# Patient Record
Sex: Female | Born: 1950 | Race: White | Hispanic: No | Marital: Married | Smoking: Never smoker
Health system: Southern US, Community
[De-identification: ages and names within clinical notes are randomized; demographics above are authoritative.]

## PROBLEM LIST (undated history)

## (undated) DIAGNOSIS — F32A Depression, unspecified: Secondary | ICD-10-CM

## (undated) DIAGNOSIS — C50919 Malignant neoplasm of unspecified site of unspecified female breast: Secondary | ICD-10-CM

## (undated) DIAGNOSIS — F329 Major depressive disorder, single episode, unspecified: Secondary | ICD-10-CM

## (undated) DIAGNOSIS — E079 Disorder of thyroid, unspecified: Secondary | ICD-10-CM

## (undated) DIAGNOSIS — F419 Anxiety disorder, unspecified: Secondary | ICD-10-CM

## (undated) HISTORY — PX: OTHER SURGICAL HISTORY: SHX169

## (undated) HISTORY — PX: COSMETIC SURGERY: SHX468

## (undated) HISTORY — PX: NASAL SEPTUM SURGERY: SHX37

## (undated) HISTORY — PX: TONSILLECTOMY: SUR1361

## (undated) HISTORY — PX: NECK SURGERY: SHX720

## (undated) HISTORY — PX: BREAST LUMPECTOMY: SHX2

## (undated) HISTORY — PX: FACIAL COSMETIC SURGERY: SHX629

## (undated) HISTORY — DX: Disorder of thyroid, unspecified: E07.9

---

## 1984-06-16 HISTORY — PX: DILATION AND CURETTAGE OF UTERUS: SHX78

## 1996-06-16 DIAGNOSIS — C50919 Malignant neoplasm of unspecified site of unspecified female breast: Secondary | ICD-10-CM

## 1996-06-16 HISTORY — DX: Malignant neoplasm of unspecified site of unspecified female breast: C50.919

## 1998-01-04 ENCOUNTER — Other Ambulatory Visit: Admission: RE | Admit: 1998-01-04 | Discharge: 1998-01-04 | Payer: Self-pay | Admitting: Obstetrics and Gynecology

## 1998-01-05 ENCOUNTER — Other Ambulatory Visit: Admission: RE | Admit: 1998-01-05 | Discharge: 1998-01-05 | Payer: Self-pay | Admitting: Obstetrics and Gynecology

## 1998-07-05 ENCOUNTER — Encounter: Payer: Self-pay | Admitting: Internal Medicine

## 1999-02-13 ENCOUNTER — Other Ambulatory Visit: Admission: RE | Admit: 1999-02-13 | Discharge: 1999-02-13 | Payer: Self-pay | Admitting: Obstetrics and Gynecology

## 1999-11-12 ENCOUNTER — Other Ambulatory Visit: Admission: RE | Admit: 1999-11-12 | Discharge: 1999-11-12 | Payer: Self-pay | Admitting: Obstetrics and Gynecology

## 1999-11-27 ENCOUNTER — Other Ambulatory Visit: Admission: RE | Admit: 1999-11-27 | Discharge: 1999-11-27 | Payer: Self-pay | Admitting: Obstetrics and Gynecology

## 1999-11-27 ENCOUNTER — Encounter (INDEPENDENT_AMBULATORY_CARE_PROVIDER_SITE_OTHER): Payer: Self-pay

## 2000-04-09 ENCOUNTER — Emergency Department (HOSPITAL_COMMUNITY): Admission: EM | Admit: 2000-04-09 | Discharge: 2000-04-09 | Payer: Self-pay

## 2000-11-17 ENCOUNTER — Encounter: Payer: Self-pay | Admitting: Internal Medicine

## 2000-11-17 ENCOUNTER — Encounter: Admission: RE | Admit: 2000-11-17 | Discharge: 2000-11-17 | Payer: Self-pay | Admitting: Internal Medicine

## 2000-12-08 ENCOUNTER — Other Ambulatory Visit: Admission: RE | Admit: 2000-12-08 | Discharge: 2000-12-08 | Payer: Self-pay | Admitting: Obstetrics and Gynecology

## 2001-03-19 ENCOUNTER — Emergency Department (HOSPITAL_COMMUNITY): Admission: EM | Admit: 2001-03-19 | Discharge: 2001-03-19 | Payer: Self-pay | Admitting: Emergency Medicine

## 2002-06-24 ENCOUNTER — Other Ambulatory Visit: Admission: RE | Admit: 2002-06-24 | Discharge: 2002-06-24 | Payer: Self-pay | Admitting: Obstetrics and Gynecology

## 2002-09-26 ENCOUNTER — Emergency Department (HOSPITAL_COMMUNITY): Admission: EM | Admit: 2002-09-26 | Discharge: 2002-09-26 | Payer: Self-pay | Admitting: Emergency Medicine

## 2002-12-29 ENCOUNTER — Encounter: Payer: Self-pay | Admitting: Emergency Medicine

## 2002-12-29 ENCOUNTER — Emergency Department (HOSPITAL_COMMUNITY): Admission: EM | Admit: 2002-12-29 | Discharge: 2002-12-29 | Payer: Self-pay | Admitting: Emergency Medicine

## 2003-08-16 ENCOUNTER — Other Ambulatory Visit: Admission: RE | Admit: 2003-08-16 | Discharge: 2003-08-16 | Payer: Self-pay | Admitting: Obstetrics and Gynecology

## 2003-08-17 ENCOUNTER — Other Ambulatory Visit: Admission: RE | Admit: 2003-08-17 | Discharge: 2003-08-17 | Payer: Self-pay | Admitting: Obstetrics and Gynecology

## 2004-02-27 ENCOUNTER — Other Ambulatory Visit: Admission: RE | Admit: 2004-02-27 | Discharge: 2004-02-27 | Payer: Self-pay | Admitting: Obstetrics and Gynecology

## 2004-05-13 ENCOUNTER — Emergency Department (HOSPITAL_COMMUNITY): Admission: EM | Admit: 2004-05-13 | Discharge: 2004-05-13 | Payer: Self-pay | Admitting: Emergency Medicine

## 2004-05-23 ENCOUNTER — Emergency Department (HOSPITAL_COMMUNITY): Admission: EM | Admit: 2004-05-23 | Discharge: 2004-05-23 | Payer: Self-pay | Admitting: Emergency Medicine

## 2004-07-09 ENCOUNTER — Ambulatory Visit: Payer: Self-pay | Admitting: Family Medicine

## 2005-06-25 ENCOUNTER — Other Ambulatory Visit: Admission: RE | Admit: 2005-06-25 | Discharge: 2005-06-25 | Payer: Self-pay | Admitting: Obstetrics and Gynecology

## 2006-10-27 ENCOUNTER — Ambulatory Visit: Payer: Self-pay | Admitting: Internal Medicine

## 2006-10-27 LAB — CONVERTED CEMR LAB
ALT: 14 units/L (ref 0–40)
BUN: 10 mg/dL (ref 6–23)
Bilirubin Urine: NEGATIVE
Bilirubin, Direct: 0.1 mg/dL (ref 0.0–0.3)
Calcium: 9.3 mg/dL (ref 8.4–10.5)
Cholesterol: 183 mg/dL (ref 0–200)
Eosinophils Absolute: 0.2 10*3/uL (ref 0.0–0.6)
GFR calc Af Amer: 96 mL/min
GFR calc non Af Amer: 80 mL/min
Glucose, Bld: 84 mg/dL (ref 70–99)
Lymphocytes Relative: 22.1 % (ref 12.0–46.0)
MCV: 90.2 fL (ref 78.0–100.0)
Monocytes Relative: 10 % (ref 3.0–11.0)
Neutro Abs: 2.4 10*3/uL (ref 1.4–7.7)
Platelets: 187 10*3/uL (ref 150–400)
Total CHOL/HDL Ratio: 3.6
Triglycerides: 68 mg/dL (ref 0–149)
Urine Glucose: NEGATIVE mg/dL

## 2006-11-03 ENCOUNTER — Ambulatory Visit: Payer: Self-pay | Admitting: Internal Medicine

## 2007-05-07 ENCOUNTER — Ambulatory Visit: Payer: Self-pay | Admitting: Internal Medicine

## 2007-05-07 DIAGNOSIS — S336XXA Sprain of sacroiliac joint, initial encounter: Secondary | ICD-10-CM | POA: Insufficient documentation

## 2007-05-18 ENCOUNTER — Telehealth: Payer: Self-pay | Admitting: Internal Medicine

## 2007-06-14 ENCOUNTER — Ambulatory Visit: Payer: Self-pay | Admitting: Gastroenterology

## 2007-10-06 ENCOUNTER — Encounter: Payer: Self-pay | Admitting: *Deleted

## 2007-10-06 DIAGNOSIS — Z87898 Personal history of other specified conditions: Secondary | ICD-10-CM | POA: Insufficient documentation

## 2007-10-06 DIAGNOSIS — I868 Varicose veins of other specified sites: Secondary | ICD-10-CM | POA: Insufficient documentation

## 2007-10-06 DIAGNOSIS — G43909 Migraine, unspecified, not intractable, without status migrainosus: Secondary | ICD-10-CM | POA: Insufficient documentation

## 2007-10-06 DIAGNOSIS — Z853 Personal history of malignant neoplasm of breast: Secondary | ICD-10-CM | POA: Insufficient documentation

## 2007-10-06 DIAGNOSIS — Z9189 Other specified personal risk factors, not elsewhere classified: Secondary | ICD-10-CM | POA: Insufficient documentation

## 2007-10-06 DIAGNOSIS — Z9089 Acquired absence of other organs: Secondary | ICD-10-CM | POA: Insufficient documentation

## 2007-10-06 DIAGNOSIS — Z87448 Personal history of other diseases of urinary system: Secondary | ICD-10-CM | POA: Insufficient documentation

## 2007-10-25 ENCOUNTER — Ambulatory Visit: Payer: Self-pay | Admitting: Internal Medicine

## 2007-10-25 DIAGNOSIS — F4321 Adjustment disorder with depressed mood: Secondary | ICD-10-CM | POA: Insufficient documentation

## 2007-10-27 ENCOUNTER — Telehealth: Payer: Self-pay | Admitting: Internal Medicine

## 2007-11-18 ENCOUNTER — Ambulatory Visit: Payer: Self-pay | Admitting: Internal Medicine

## 2008-05-10 ENCOUNTER — Emergency Department (HOSPITAL_BASED_OUTPATIENT_CLINIC_OR_DEPARTMENT_OTHER): Admission: EM | Admit: 2008-05-10 | Discharge: 2008-05-10 | Payer: Self-pay | Admitting: Emergency Medicine

## 2009-06-20 ENCOUNTER — Telehealth: Payer: Self-pay | Admitting: Internal Medicine

## 2009-06-20 ENCOUNTER — Ambulatory Visit: Payer: Self-pay | Admitting: Internal Medicine

## 2009-06-21 ENCOUNTER — Encounter: Payer: Self-pay | Admitting: Internal Medicine

## 2009-06-21 ENCOUNTER — Ambulatory Visit: Payer: Self-pay | Admitting: Internal Medicine

## 2009-07-16 ENCOUNTER — Encounter: Payer: Self-pay | Admitting: Internal Medicine

## 2009-10-08 ENCOUNTER — Ambulatory Visit: Payer: Self-pay | Admitting: Internal Medicine

## 2009-10-09 DIAGNOSIS — M810 Age-related osteoporosis without current pathological fracture: Secondary | ICD-10-CM | POA: Insufficient documentation

## 2010-01-24 ENCOUNTER — Telehealth: Payer: Self-pay | Admitting: Internal Medicine

## 2010-02-04 ENCOUNTER — Encounter: Admission: RE | Admit: 2010-02-04 | Discharge: 2010-02-04 | Payer: Self-pay | Admitting: Internal Medicine

## 2010-02-20 ENCOUNTER — Encounter: Payer: Self-pay | Admitting: Internal Medicine

## 2010-02-20 ENCOUNTER — Ambulatory Visit: Payer: Self-pay | Admitting: Internal Medicine

## 2010-02-20 LAB — CONVERTED CEMR LAB
BUN: 14 mg/dL (ref 6–23)
Basophils Relative: 1 % (ref 0.0–3.0)
CO2: 28 meq/L (ref 19–32)
Chloride: 105 meq/L (ref 96–112)
Creatinine, Ser: 0.9 mg/dL (ref 0.4–1.2)
Eosinophils Absolute: 0.2 10*3/uL (ref 0.0–0.7)
Eosinophils Relative: 5.3 % — ABNORMAL HIGH (ref 0.0–5.0)
Glucose, Bld: 85 mg/dL (ref 70–99)
HCV Ab: NEGATIVE
Hemoglobin: 15.6 g/dL — ABNORMAL HIGH (ref 12.0–15.0)
Lymphocytes Relative: 20.2 % (ref 12.0–46.0)
MCHC: 34.2 g/dL (ref 30.0–36.0)
Monocytes Relative: 8 % (ref 3.0–12.0)
Neutro Abs: 2.5 10*3/uL (ref 1.4–7.7)
Nitrite: NEGATIVE
RBC: 5.03 M/uL (ref 3.87–5.11)
Specific Gravity, Urine: 1.025 (ref 1.000–1.030)
Total Protein, Urine: NEGATIVE mg/dL
Urine Glucose: NEGATIVE mg/dL
pH: 5.5 (ref 5.0–8.0)

## 2010-02-24 ENCOUNTER — Encounter: Payer: Self-pay | Admitting: Internal Medicine

## 2010-06-18 ENCOUNTER — Encounter (INDEPENDENT_AMBULATORY_CARE_PROVIDER_SITE_OTHER): Payer: Self-pay | Admitting: *Deleted

## 2010-06-25 ENCOUNTER — Ambulatory Visit
Admission: RE | Admit: 2010-06-25 | Discharge: 2010-06-25 | Payer: Self-pay | Source: Home / Self Care | Attending: Internal Medicine | Admitting: Internal Medicine

## 2010-07-05 ENCOUNTER — Encounter (INDEPENDENT_AMBULATORY_CARE_PROVIDER_SITE_OTHER): Payer: Self-pay

## 2010-07-09 ENCOUNTER — Ambulatory Visit
Admission: RE | Admit: 2010-07-09 | Discharge: 2010-07-09 | Payer: Self-pay | Source: Home / Self Care | Attending: Gastroenterology | Admitting: Gastroenterology

## 2010-07-16 NOTE — Letter (Signed)
   [facility] [address] [place], [place]  [address] Phone: [phone]      July 16, 2009   Cheryl Lawson [address] [place], [place] [address]  RE:  LAB RESULTS  Dear  [name],  The following is an interpretation of your most recent lab tests.  Please take note of any instructions provided or changes to medications that have resulted from your lab work.    Bone density study reveals osteopenia at the spine and osteoporosis at the hips with T-scores of -2.5 left, -2.8 right. There is an increased fracture risk for any locations at 15.5% (avg 7.1%) and for hip this is 4% (avg <1%) over the next 10 years.   Please schedule an appointment for the purpose of discussing treatment.   Sincerely Yours,    Jacques Navy MD

## 2010-07-16 NOTE — Assessment & Plan Note (Signed)
Summary: FU ON LETTER ABOUT DEXA RESULTS/NWS  #   Vital Signs:  Patient profile:   60 year old female Height:      65 inches Weight:      122 pounds BMI:     20.38 O2 Sat:      97 % on Room air Temp:     97.3 degrees F oral Pulse rate:   75 / minute BP sitting:   108 / 62  (left arm) Cuff size:   regular  Vitals Entered By: Bill Salinas CMA (October 08, 2009 2:18 PM)  O2 Flow:  Room air CC: office visit to discuss treatment for osteopenia/ pt states that she does not take sertraline/ab   Primary Care Provider:  Norins  CC:  office visit to discuss treatment for osteopenia/ pt states that she does not take sertraline/ab.  History of Present Illness: Patient presents to discuss results of DXA scan. She had a previous scan in 2000 at [facility]. She had been tried on fosamax but was intolerant due to GI side effects. She has been conscientious about weight bearig exercise and calcium/Vit D supplementation. She has never had a fracture and reports a good family history.  Both Studies reviewed using Tscores for comparison:                                        VERTBRAL SPINE             RIGHT FEMORAL NECK        LEFT FEMORAL NECK               [facility]  '00                  -0.58                                    -2.25              [facility] '11            -1.25                                     -2.8                Current fracture risk over 10 years: any location 15.5%                                                                                        Hip   4.0%    (nl <1%)  Reviewed treatment options: various bisphosphonates vs SERM. The later may not be possible since she hormone receptor positive breast cancer.   Current Medications (verified): 1)  Calcium 500/d 500-400 Mg-Unit  Chew (Calcium-Vitamin D) .... Once Daily 2)  Sertraline Hcl 50 Mg  Tabs (Sertraline Hcl) .Marland Kitchen.. 1 By Mouth Once Daily 3)  Alprazolam 0.5 Mg  Tabs (Alprazolam) .Marland KitchenMarland KitchenMarland Kitchen 1  Every 6 Hrs As Needed  Allergies  (verified): No Known Drug Allergies  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) UTI'S, HX OF (ICD-V13.00) Hx of MIGRAINE HEADACHE (ICD-346.90) ADENOCARCINOMA, BREAST, HX OF (ICD-V10.3) SHINGLES, HX OF (ICD-V13.8) RADIATION THERAPY, HX OF (ICD-V15.9) HEALTHY ADULT FEMALE (ICD-V70.0) SPRAIN AND STRAIN OF SACROILIAC (ICD-846.1)    Past Surgical History: Last updated: 10/06/2007 * NASAL SURGERY FOR DEVIATED SEPTUM * LUMPECTOMY FOR BREAST CANCER BREAST BIOPSY, HX OF (ICD-V15.9) * INFERTILITY SURGERY TONSILLECTOMY, HX OF (ICD-V45.79)  Review of Systems  The patient denies anorexia, weight loss, weight gain, hoarseness, chest pain, dyspnea on exertion, prolonged cough, abdominal pain, muscle weakness, and unusual weight change.    Physical Exam  General:  WNWD slender white female with a medium frame in no distress Head:  normocephalic and atraumatic.   Eyes:  corneas and lenses clear and no injection.   Neck:  supple and full ROM.   Chest Wall:  no deformities.   Lungs:  normal breath sounds.   Heart:  normal rate and regular rhythm.   Skin:  turgor normal and color normal.   Psych:  Oriented X3, memory intact for recent and remote, normally interactive, and good eye contact.     Impression & Recommendations:  Problem # 1:  OSTEOPOROSIS (ICD-733.00) See HPI. After discussing the treatment options her decision is to go with life-style managment: regular weright bearing exercise, calcium supplementation with diet and supplement sources of calcium and Vit D @ [phone] international units daily. Repeat DXA June '12   (greater than 50% of 20 min viist spent counselling and educating about osteoporisis and treatment options)  Complete Medication List: 1)  Calcium 500/d 500-400 Mg-unit Chew (Calcium-vitamin d) .... Once daily 2)  Sertraline Hcl 50 Mg Tabs (Sertraline hcl) .Marland Kitchen.. 1 by mouth once daily 3)  Alprazolam 0.5 Mg Tabs  (Alprazolam) .Marland Kitchen.. 1 every 6 hrs as needed

## 2010-07-16 NOTE — Progress Notes (Signed)
Summary: Alprazolam rx  Phone Note Call from Patient Call back at Home Phone [phone]   Summary of Call: Patient left message on triage that she was seen today and forgot to ask MD for Alprazolam prescription. Per the patient she needs just a few that she can take when she just can't deal with stress. Per the patient, she suffers from depression and is easily angered. Patient would like this called in. Please advise. Initial call taken by: Lucious Groves,  June 20, 2009 1:08 PM  Follow-up for Phone Call        OK for alprazolam 0.5 mg 1 q 6 as needed, # 60 1 refill Follow-up by: Jacques Navy MD,  June 20, 2009 4:39 PM    New/Updated Medications: ALPRAZOLAM 0.5 MG  TABS (ALPRAZOLAM) 1 every 6 hrs as needed Prescriptions: ALPRAZOLAM 0.5 MG  TABS (ALPRAZOLAM) 1 every 6 hrs as needed  #60 x 1   Entered by:   Margaret Pyle, CMA   Authorized by:   Jacques Navy MD   Signed by:   Margaret Pyle, CMA on 06/21/2009   Method used:   Telephoned to ...       [id number] (retail)       [address]       [place], [place]  [address]       Ph: [phone] or [phone]       Fax: [phone]   RxID:   [id number]

## 2010-07-16 NOTE — Assessment & Plan Note (Signed)
Summary: ARM PAIN FOR 2-3 MO/ GETTING WORSE/NWS   Vital Signs:  Patient profile:   60 year old female Height:      65 inches Weight:      120 pounds BMI:     20.04 O2 Sat:      97 % on Room air Temp:     97.6 degrees F oral Pulse rate:   78 / minute BP sitting:   96 / 64  (left arm) Cuff size:   regular  Vitals Entered By: Bill Salinas CMA (June 20, 2009 10:48 AM)  O2 Flow:  Room air CC: pt here with complaint of pain in her left arm x several months. Pt states she noticed the pain shorlty after she stopped exercising in her pool. Pt states it is getting to the point where the pain is enabling her to pick up objects with her left arm and the pain disturbs her sleep/ pt has never had a colonoscopy and is due for a pap this months with Dr Ross/ ab   Primary Care Provider:  Skarlett Sedlacek  CC:  pt here with complaint of pain in her left arm x several months. Pt states she noticed the pain shorlty after she stopped exercising in her pool. Pt states it is getting to the point where the pain is enabling her to pick up objects with her left arm and the pain disturbs her sleep/ pt has never had a colonoscopy and is due for a pap this months with Dr Ross/ ab.  History of Present Illness: Perr CC note: patient with several weeks of pain in the left forearm that is worse with lifting anything of weight. She denies any injury or overuse. She has well preserved sensation. She has no prior h/o similar problem. She has tried otc medications, e.g. APAP, anti-inflammatory meds in low dose.  Current Medications (verified): 1)  Calcium 500/d 500-400 Mg-Unit  Chew (Calcium-Vitamin D) .... Once Daily 2)  Sertraline Hcl 50 Mg  Tabs (Sertraline Hcl) .Marland Kitchen.. 1 By Mouth Once Daily 3)  Alprazolam 0.5 Mg  Tabs (Alprazolam) .Marland Kitchen.. 1 By Mouth Three Times A Day X 2 Weeks, Two Times A Day X 1 Week, Once Daily X 1 Week Then As Needed.  Allergies (verified): No Known Drug Allergies  Past History:  Past Medical  History: Last updated: 10/06/2007 VARICOSE VEIN (ICD-456.8) UTI'S, HX OF (ICD-V13.00) Hx of MIGRAINE HEADACHE (ICD-346.90) ADENOCARCINOMA, BREAST, HX OF (ICD-V10.3) SHINGLES, HX OF (ICD-V13.8) RADIATION THERAPY, HX OF (ICD-V15.9) HEALTHY ADULT FEMALE (ICD-V70.0) SPRAIN AND STRAIN OF SACROILIAC (ICD-846.1)    Past Surgical History: Last updated: 10/06/2007 * NASAL SURGERY FOR DEVIATED SEPTUM * LUMPECTOMY FOR BREAST CANCER BREAST BIOPSY, HX OF (ICD-V15.9) * INFERTILITY SURGERY TONSILLECTOMY, HX OF (ICD-V45.79)  Review of Systems  The patient denies anorexia, fever, weight loss, weight gain, decreased hearing, chest pain, dyspnea on exertion, headaches, abdominal pain, muscle weakness, depression, and enlarged lymph nodes.    Physical Exam  General:  Well-developed,well-nourished,in no acute distress; alert,appropriate and cooperative throughout examination Head:  Normocephalic and atraumatic without obvious abnormalities. No apparent alopecia or balding. Eyes:  pupils equal, pupils round, corneas and lenses clear, and no injection.   Neck:  supple, full ROM, and no masses.   Lungs:  normal respiratory effort.   Heart:  normal rate and regular rhythm.   Msk:  Normal ROM about the shoulder, elbow and wrist. Normal grip strength. No joint swelling, redness or tenderness. She has tenderness at the lateral elbow with  pronation against resistance. Pulses:  2+ radial and ulnar pulse, good capillary refill Extremities:  No edema Neurologic:  normal DTRs at the bicep and radial tendons left. Normal sensation.   Impression & Recommendations:  Problem # 1:  LATERAL EPICONDYLITIS, LEFT (ICD-726.32) By history and exam she has lateral epicondylitis  Plan - ROM exercise           lineament of choice or therma pads            otc NSAIDs of choice           if no improvement - depomedrol injection.   Complete Medication List: 1)  Calcium 500/d 500-400 Mg-unit Chew (Calcium-vitamin d)  .... Once daily 2)  Sertraline Hcl 50 Mg Tabs (Sertraline hcl) .Marland Kitchen.. 1 by mouth once daily 3)  Alprazolam 0.5 Mg Tabs (Alprazolam) .Marland Kitchen.. 1 every 6 hrs as needed

## 2010-07-16 NOTE — Letter (Signed)
Summary: Letter from patient  Letter from patient   Imported By: Lennie Odor 03/04/2010 15:57:24  _____________________________________________________________________  External Attachment:    Type:   Image     Comment:   External Document

## 2010-07-16 NOTE — Miscellaneous (Signed)
Summary: BONE DENSITY  Clinical Lists Changes  Orders: Added new Test order of T-Bone Densitometry (77080) - Signed Added new Test order of T-Lumbar Vertebral Assessment (77082) - Signed 

## 2010-07-16 NOTE — Progress Notes (Signed)
Summary: [facility]  Phone Note Call from Patient Call back at Home Phone [phone] Call back at [phone]   Summary of Call: Patient is scheduled for reconstructive breast surgery in [place], Sept 20th. Her mamogram is scheduled one month after this. She wants Dr Debby Bud to put in referral for mamogram prior to Sept 18th.  She has surgery clearance apt w/Dr Debby Bud on Sept 7th.  Initial call taken by: Lamar Sprinkles, CMA,  January 24, 2010 5:00 PM  Follow-up for Phone Call        OK for diagnostic mammogram now. [facility] notified Follow-up by: Jacques Navy MD,  January 24, 2010 6:12 PM  Additional Follow-up for Phone Call Additional follow up Details #1::        Left detailed vm for patient on hm # Additional Follow-up by: Lamar Sprinkles, CMA,  January 24, 2010 6:26 PM

## 2010-07-16 NOTE — Assessment & Plan Note (Signed)
Summary: will need forms filled out for surgery in [place]/medical ...   Vital Signs:  Patient profile:   60 year old female Height:      65 inches Weight:      121 pounds BMI:     20.21 O2 Sat:      98 % on Room air Temp:     98.4 degrees F oral Pulse rate:   70 / minute BP sitting:   104 / 68  (left arm) Cuff size:   regular  Vitals Entered By: Bill Salinas CMA (February 20, 2010 11:09 AM)  O2 Flow:  Room air CC: pt her for surgical clearance/ ab Comments PT is currently on no medications   Primary Care Provider:  Norins  CC:  pt her for surgical clearance/ ab.  History of Present Illness: Patient presents for preoperative examination. She is scheduled for cosmetic plastic surgery by Dr. Celedonio Miyamoto in [place], [facility] September 20th. She will have breast augmentation/correction, nose augmentation and facial sculpting.  She has been feeling well and has no active medical complaints. She did have a recent mammogram, report included, that was negative.   Current Medications (verified): 1)  Calcium 500/d 500-400 Mg-Unit  Chew (Calcium-Vitamin D) .... Once Daily 2)  Sertraline Hcl 50 Mg  Tabs (Sertraline Hcl) .Marland Kitchen.. 1 By Mouth Once Daily 3)  Alprazolam 0.5 Mg  Tabs (Alprazolam) .Marland Kitchen.. 1 Every 6 Hrs As Needed  Allergies (verified): No Known Drug Allergies  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) UTI'S, HX OF (ICD-V13.00) Hx of MIGRAINE HEADACHE (ICD-346.90) ADENOCARCINOMA, BREAST, HX OF (ICD-V10.3) SHINGLES, HX OF (ICD-V13.8) RADIATION THERAPY, HX OF (ICD-V15.9) HEALTHY ADULT FEMALE (ICD-V70.0) SPRAIN AND STRAIN OF SACROILIAC (ICD-846.1)    Past Surgical History: Last updated: 10/06/2007 * NASAL SURGERY FOR DEVIATED SEPTUM * LUMPECTOMY FOR BREAST CANCER BREAST BIOPSY, HX OF (ICD-V15.9) * INFERTILITY SURGERY TONSILLECTOMY, HX OF (ICD-V45.79)  Family History: Father - 1930: hypothyroid, hypertension,  prostate cancer Mother - deceased @ 79: ovarian cancer; hypothroid after goiter surgery Neg - breast cancer, colon cancer, DM, CAD/MI  Social History: HSG Married - '77 great niece 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 no history of physical or sexual abuse.   Review of Systems  The patient denies anorexia, fever, weight loss, weight gain, vision loss, decreased hearing, chest pain, syncope, dyspnea on exertion, prolonged cough, headaches, hemoptysis, abdominal pain, severe indigestion/heartburn, hematuria, muscle weakness, suspicious skin lesions, transient blindness, depression, abnormal bleeding, enlarged lymph nodes, and angioedema.    Physical Exam  General:  Slender, WNWD white female in no distress Head:  Normocephalic and atraumatic without obvious abnormalities. No apparent alopecia or balding. Eyes:  No corneal or conjunctival inflammation noted. EOMI. Perrla. Funduscopic exam benign, without hemorrhages, exudates or papilledema. Vision grossly normal. Ears:  External ear exam shows no significant lesions or deformities.  Otoscopic examination reveals clear canals, tympanic membranes are intact bilaterally without bulging, retraction, inflammation or discharge. Hearing is grossly normal bilaterally. Nose:  no external deformity and no external erythema.   Mouth:  Oral mucosa and oropharynx without lesions or exudates.  Teeth in good repair. Neck:  supple, full ROM, no thyromegaly, no carotid bruits, and no cervical lymphadenopathy.   Chest Wall:  No deformities, masses, or tenderness noted. Breasts:  deferred Lungs:  Normal respiratory effort, chest expands symmetrically. Lungs are clear to auscultation, no crackles or wheezes. Heart:  Normal rate and regular rhythm.  S1 and S2 normal without gallop, murmur, click, rub or other extra sounds. Abdomen:  soft, non-tender, normal bowel sounds, no distention, no guarding, no rigidity,  and no hepatomegaly.   Genitalia:  deferred Msk:  normal ROM, no joint tenderness, no joint swelling, no joint warmth, and no joint deformities.   Pulses:  2+ radial and DP pulses Extremities:  No clubbing, cyanosis, edema, or deformity noted with normal full range of motion of all joints.   Neurologic:  alert & oriented X3, cranial nerves II-XII intact, strength normal in all extremities, gait normal, and DTRs symmetrical and normal.   Skin:  turgor normal, color normal, no rashes, and no suspicious lesions.  Well healed feint scar left elbow Cervical Nodes:  no anterior cervical adenopathy and no posterior cervical adenopathy.   Inguinal Nodes:  no R inguinal adenopathy and no L inguinal adenopathy.   Psych:  Oriented X3, memory intact for recent and remote, normally interactive, good eye contact, and not anxious appearing.     Impression & Recommendations:  Problem # 1:  PREOPERATIVE EXAMINATION (ICD-V72.84) Patient with a normal exam. Medically cleared for surgery.  Orders: TLB-CBC Platelet - w/Differential (85025-CBCD) TLB-BMP (Basic Metabolic Panel-BMET) (80048-METABOL) TLB-PTT (85730-PTTL) TLB-PT (Protime) (85610-PTP) TLB-Udip w/ Micro (81001-URINE) T-Hepatitis C Antibody ([address]) T-HIV Antibody  (Reflex) ([address])  Complete Medication List: 1)  Calcium 500/d 500-400 Mg-unit Chew (Calcium-vitamin d) .... Once daily 2)  Sertraline Hcl 50 Mg Tabs (Sertraline hcl) .Marland Kitchen.. 1 by mouth once daily 3)  Alprazolam 0.5 Mg Tabs (Alprazolam) .Marland Kitchen.. 1 every 6 hrs as needed  Patient: Cheryl Lawson Note: All result statuses are Final unless otherwise noted.  Tests: (1) CBC Platelet w/Diff (CBCD)   White Cell Count     [L]  3.8 K/uL                    4.5-10.5   Red Cell Count            5.03 Mil/uL                 3.87-5.11   Hemoglobin           [H]  15.6 g/dL                   29.5-62.1   Hematocrit                45.6 %                      36.0-46.0   MCV                        90.7 fl                     78.0-100.0   MCHC                      34.2 g/dL                   30.8-65.7   RDW                       13.0 %                      11.5-14.6   Platelet Count            164.0 K/uL  150.0-400.0   Neutrophil %              65.5 %                      43.0-77.0   Lymphocyte %              20.2 %                      12.0-46.0   Monocyte %                8.0 %                       3.0-12.0   Eosinophils%         [H]  5.3 %                       0.0-5.0   Basophils %               1.0 %                       0.0-3.0   Neutrophill Absolute      2.5 K/uL                    1.4-7.7   Lymphocyte Absolute       0.8 K/uL                    0.7-4.0   Monocyte Absolute         0.3 K/uL                    0.1-1.0  Eosinophils, Absolute                             0.2 K/uL                    0.0-0.7   Basophils Absolute        0.0 K/uL                    0.0-0.1  Tests: (2) BMP (METABOL)   Sodium                    143 mEq/L                   135-145   Potassium                 4.8 mEq/L                   3.5-5.1   Chloride                  105 mEq/L                   96-112   Carbon Dioxide            28 mEq/L                    19-32   Glucose                   85 mg/dL                    04-54  BUN                       14 mg/dL                    7-82   Creatinine                0.9 mg/dL                   9.5-6.2   Calcium                   9.8 mg/dL                   1.3-08.6   GFR                       69.52 mL/min                >60  Tests: (3) PTT (PTTL)   Appt Time                 28.8 SEC                    21.7-28.8  Tests: (4) PT (PTP)   Prothrombin Time          9.7 sec                     9.7-11.8   INR                       0.9 ratio                   0.8-1.0  Tests: (5) UDip w/Micro (URINE)   Color                     LT. YELLOW       RANGE:  Yellow;Lt. Yellow   Clarity                   CLEAR                       Clear    Specific Gravity          1.025                       1.000 - 1.030   Urine Ph                  5.5                         5.0-8.0   Protein                   NEGATIVE                    Negative   Urine Glucose             NEGATIVE                    Negative   Ketones                   NEGATIVE                    Negative   Urine  Bilirubin           NEGATIVE                    Negative   Blood                     TRACE-LYSED                 Negative   Urobilinogen              0.2                         0.0 - 1.0   Leukocyte Esterace        NEGATIVE                    Negative   Nitrite                   NEGATIVE                    Negative   Urine WBC                 0-2/hpf                     0-2/hpf   Urine RBC                 0-2/hpf                     0-2/hpf   Urine Epith               Rare(0-4/hpf)               Rare(0-4/hpf) Tests: (1) Hepatitis C Antibody (40981)   Hepatitis C Antibody      NEG                         NEGATIVE  Tests: (2) HIV 1,2 Antibody, with Reflex (19147)   HIV Antibody              NON REAC                    NON REAC

## 2010-07-18 NOTE — Miscellaneous (Signed)
Summary: Lec previsit  Clinical Lists Changes  Medications: Added new medication of MOVIPREP 100 GM  SOLR (PEG-KCL-NACL-NASULF-NA ASC-C) As per prep instructions. - Signed Rx of MOVIPREP 100 GM  SOLR (PEG-KCL-NACL-NASULF-NA ASC-C) As per prep instructions.;  #1 x 0;  Signed;  Entered by: Ulis Rias RN;  Authorized by: Rachael Fee MD;  Method used: Electronically to [facility] [address], [place], [place]  [address], Ph: [phone] or [phone], Fax: [phone] Observations: Added new observation of NKA: T (07/09/2010 11:01)    Prescriptions: MOVIPREP 100 GM  SOLR (PEG-KCL-NACL-NASULF-NA ASC-C) As per prep instructions.  #1 x 0   Entered by:   Ulis Rias RN   Authorized by:   Rachael Fee MD   Signed by:   Ulis Rias RN on 07/09/2010   Method used:   Electronically to        [facility] (retail)       [address]       [place], [place]  [address]       Ph: [phone] or [phone]       Fax: [phone]   RxID:   [id number]

## 2010-07-18 NOTE — Letter (Signed)
Summary: [facility] Instructions  [facility]  [address] [place], [place] [address]   Phone: [phone]  Fax: [phone]       Cheryl Lawson    1951-04-21    MRN: [id number]        Procedure Day /Date: Tuesday 07-23-10     Arrival Time: 10:30 am     Procedure Time: 11:30 am     Location of Procedure:                    _x _   [facility] (4th Floor)                        PREPARATION FOR COLONOSCOPY WITH MOVIPREP   Starting 5 days prior to your procedure  07-18-10  do not eat nuts, seeds, popcorn, corn, beans, peas,  salads, or any raw vegetables.  Do not take any fiber supplements (e.g. Metamucil, Citrucel, and Benefiber).  THE DAY BEFORE YOUR PROCEDURE         DATE:  07-22-10  DAY:  Monday   1.  Drink clear liquids the entire day-NO SOLID FOOD  2.  Do not drink anything colored red or purple.  Avoid juices with pulp.  No orange juice.  3.  Drink at least 64 oz. (8 glasses) of fluid/clear liquids during the day to prevent dehydration and help the prep work efficiently.  CLEAR LIQUIDS INCLUDE: Water Jello Ice Popsicles Tea (sugar ok, no milk/cream) Powdered fruit flavored drinks Coffee (sugar ok, no milk/cream) Gatorade Juice: apple, white grape, white cranberry  Lemonade Clear bullion, consomm, broth Carbonated beverages (any kind) Strained chicken noodle soup Hard Candy                             4.  In the morning, mix first dose of MoviPrep solution:    Empty 1 Pouch A and 1 Pouch B into the disposable container    Add lukewarm drinking water to the top line of the container. Mix to dissolve    Refrigerate (mixed solution should be used within 24 hrs)  5.  Begin drinking the prep at 5:00 p.m. The MoviPrep container is divided by 4 marks.   Every 15 minutes drink the solution down to the next mark (approximately 8 oz) until the full liter is complete.   6.  Follow completed prep with 16 oz of clear liquid of your choice  (Nothing red or purple).  Continue to drink clear liquids until bedtime.  7.  Before going to bed, mix second dose of MoviPrep solution:    Empty 1 Pouch A and 1 Pouch B into the disposable container    Add lukewarm drinking water to the top line of the container. Mix to dissolve    Refrigerate  THE DAY OF YOUR PROCEDURE      DATE:  07-23-10   DAY:  Tuesday  Beginning at  6:30 a.m. (5 hours before procedure):         1. Every 15 minutes, drink the solution down to the next mark (approx 8 oz) until the full liter is complete.  2. Follow completed prep with 16 oz. of clear liquid of your choice.    3. You may drink clear liquids until  9:30 a.m.  (2 HOURS BEFORE PROCEDURE).   MEDICATION INSTRUCTIONS  Unless otherwise instructed, you should take regular prescription medications with a small  sip of water   as early as possible the morning of your procedure.         OTHER INSTRUCTIONS  You will need a responsible adult at least 58 years of age to accompany you and drive you home.   This person must remain in the waiting room during your procedure.  Wear loose fitting clothing that is easily removed.  Leave jewelry and other valuables at home.  However, you may wish to bring a book to read or  an iPod/MP3 player to listen to music as you wait for your procedure to start.  Remove all body piercing jewelry and leave at home.  Total time from sign-in until discharge is approximately 2-3 hours.  You should go home directly after your procedure and rest.  You can resume normal activities the  day after your procedure.  The day of your procedure you should not:   Drive   Make legal decisions   Operate machinery   Drink alcohol   Return to work  You will receive specific instructions about eating, activities and medications before you leave.    The above instructions have been reviewed and explained to me by   Ulis Rias RN  July 09, 2010 11:23 AM     I  fully understand and can verbalize these instructions _____________________________ Date _________

## 2010-07-18 NOTE — Assessment & Plan Note (Signed)
Summary: lump base neck/cd   Vital Signs:  Patient profile:   60 year old female Height:      65 inches Weight:      110 pounds BMI:     18.37 O2 Sat:      97 % on Room air Temp:     98.1 degrees F oral Pulse rate:   72 / minute BP sitting:   102 / 80  (left arm) Cuff size:   regular  Vitals Entered By: Bill Salinas CMA (June 25, 2010 3:19 PM)  O2 Flow:  Room air CC: pt here for evaluation of lump on the base of her neck/ ab   Primary Care Provider:  Halaina Vanduzer  CC:  pt here for evaluation of lump on the base of her neck/ ab.  History of Present Illness: Patient presents with a concern for a lump in the right supraclavicular area.   She has had very successful plastic surgery face and neck and looks very good. Since her surgery she has been home and doing well. She has not felt sick. she has had no fevers, chills, night sweats, change in weight or other adenopathy. She has had no dental pain, ear pain or other signs of infection in the head or neck. She reports she felt a small lump between the collar bone and the jaw.  Current Medications (verified): 1)  Calcium 500/d 500-400 Mg-Unit  Chew (Calcium-Vitamin D) .... Once Daily 2)  Sertraline Hcl 50 Mg  Tabs (Sertraline Hcl) .Marland Kitchen.. 1 By Mouth Once Daily 3)  Alprazolam 0.5 Mg  Tabs (Alprazolam) .Marland Kitchen.. 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 Father - 1930: hypothyroid, hypertension, prostate cancer Mother - deceased @ 27: ovarian cancer; hypothroid after goiter surgery Neg - breast cancer, colon cancer, DM, CAD/MI  Social History: Reviewed history from 02/20/2010 and no changes required. 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 no history of physical or sexual abuse.   Review of Systems  The patient denies anorexia, fever, weight loss, weight gain, decreased hearing, hoarseness, chest pain, dyspnea on exertion, headaches, muscle weakness, and enlarged lymph nodes.    Physical Exam  General:  alert, well-developed, well-nourished, well-hydrated, and healthy-appearing white woman.   Head:  normocephalic and atraumatic.   Neck:  supple, full ROM, and no thyromegaly.   Lungs:  normal respiratory effort.   Heart:  normal rate and regular rhythm.   Pulses:  2+ radial Neurologic:  alert & oriented X3, cranial nerves II-XII intact, and gait normal.   Skin:  turgor normal, color normal, no rashes, and no suspicious lesions.   Cervical Nodes:  normal anterior cervical chain  left and right Psych:  Oriented X3, good eye contact, and not anxious appearing.     Impression & Recommendations:  Problem # 1:  SWELLING MASS OR LUMP IN  HEAD AND NECK (ICD-784.2) No abnormality on exam. No signs or symtoms to suggest underlying disease.  Plan - patient reassured. For any change she is to call.   Complete Medication List: 1)  Calcium 500/d 500-400 Mg-unit Chew (Calcium-vitamin d) .... Once daily 2)  Sertraline Hcl 50 Mg Tabs (Sertraline hcl) .Marland Kitchen.. 1 by mouth once daily 3)  Alprazolam 0.5 Mg Tabs (Alprazolam) .Marland Kitchen.. 1 every 6 hrs as needed   Orders Added: 1)  Est. Patient Level III [[address]]

## 2010-07-18 NOTE — Letter (Signed)
Summary: Pre Visit Letter Revised  [facility]  [address] [place], [place] [address]   Phone: [phone]  Fax: [phone]        06/18/2010 MRN: [id number] Cheryl Lawson [address] [place], [place]  [address]             Procedure Date:  07-23-10   Welcome to the Gastroenterology Division at [facility].    You are scheduled to see a nurse for your pre-procedure visit on 07-09-10 at 11:00a.m. on the 3rd floor at [facility], [address]  We ask that you try to arrive at our office 15 minutes prior to your appointment time to allow for check-in.  Please take a minute to review the attached form.  If you answer "Yes" to one or more of the questions on the first page, we ask that you call the person listed at your earliest opportunity.  If you answer "No" to all of the questions, please complete the rest of the form and bring it to your appointment.    Your nurse visit will consist of discussing your medical and surgical history, your immediate family medical history, and your medications.   If you are unable to list all of your medications on the form, please bring the medication bottles to your appointment and we will list them.  We will need to be aware of both prescribed and over the counter drugs.  We will need to know exact dosage information as well.    Please be prepared to read and sign documents such as consent forms, a financial agreement, and acknowledgement forms.  If necessary, and with your consent, a friend or relative is welcome to sit-in on the nurse visit with you.  Please bring your insurance card so that we may make a copy of it.  If your insurance requires a referral to see a specialist, please bring your referral form from your primary care physician.  No co-pay is required for this nurse visit.     If you cannot keep your appointment, please call [phone] to cancel or reschedule prior to your appointment date.  This allows  [place] the opportunity to schedule an appointment for another patient in need of care.    Thank you for choosing [facility] for your medical needs.  We appreciate the opportunity to care for you.  Please visit [place] at our website  to learn more about our practice.  Sincerely, The Gastroenterology Division

## 2010-07-23 ENCOUNTER — Other Ambulatory Visit: Payer: Self-pay | Admitting: Gastroenterology

## 2010-10-02 ENCOUNTER — Other Ambulatory Visit: Payer: Self-pay | Admitting: Obstetrics and Gynecology

## 2010-10-02 DIAGNOSIS — N6459 Other signs and symptoms in breast: Secondary | ICD-10-CM

## 2010-10-04 ENCOUNTER — Ambulatory Visit
Admission: RE | Admit: 2010-10-04 | Discharge: 2010-10-04 | Disposition: A | Discharge status: Home / Self Care | Payer: PRIVATE HEALTH INSURANCE | Source: Ambulatory Visit | Attending: Obstetrics and Gynecology | Admitting: Obstetrics and Gynecology

## 2010-10-04 ENCOUNTER — Other Ambulatory Visit: Payer: Self-pay | Admitting: Obstetrics and Gynecology

## 2010-10-04 DIAGNOSIS — N6459 Other signs and symptoms in breast: Secondary | ICD-10-CM

## 2010-10-29 NOTE — Assessment & Plan Note (Signed)
[facility]                                 ON-CALL NOTE   NAME:Peeters, ALTEA                          MRN:          [id number]  DATE:05/07/2007                            DOB:          1950/09/19    PHONE NUMBER:  [phone]   PRIMARY:  Dr. Debby Bud.   SUBJECTIVE:  Ms. Glore has no history of back problems.  She awoke in  the night this evening with pain in her right lower back around her  panty line.  She states that, since 2 days ago, she has been having some  burning sensation in the back over there that has progressively gotten  worse.  She states that she has also had some right-sided abdominal  cramping.  She denies nausea, vomiting, rash, fever, chills.  No  dysuria.  No blood in her urine.  No blood in her stool.  No  constipation.   ASSESSMENT AND PLAN:  Low back pain.  Possible herpes zoster in early  stages versus urinary tract infection, versus other.  No signs or  symptoms suggesting appendicitis or emergent need to go to the emergency  room.  If her symptoms progress this evening, become more severe, and if  she has nausea, vomiting, or fever, she will be evaluated in the  emergency room.  Otherwise, will call the office in the morning to be  seen.     Kerby Nora, MD  Electronically Signed    AB/MedQ  DD: 05/07/2007  DT: 05/07/2007  Job #: [id number]

## 2010-10-29 NOTE — Assessment & Plan Note (Signed)
[facility]                           PRIMARY CARE OFFICE NOTE   NAME:Cheryl Lawson, Cheryl Lawson                        MRN:          [id number]  DATE:11/03/2006                            DOB:          [birth year]    Cheryl Lawson is a pleasant 61 year old woman who presents to establish for  ongoing continuity of care.  The patient reports she has no specific  medical problems at this time.   On questioning, the patient does report this is a very stressful time  for her.  Her mother has been diagnosed with widely metastatic ovarian  cancer and has undergone several cycles of chemotherapy after debulking  surgery.  She has had a resulting abdominal wall fistula and is  receiving TPN at the home of Cheryl Lawson.  Cheryl Lawson is her full-time  caretaker, which is becoming quite a difficult burden.  We did discuss  this at length, and I have encouraged her to seek an opinion from her  mother's oncologist as to her relative prognosis.  If her prognosis  should be grim, I suspect, she may be a candidate for hospice care,  which should help greatly in managing her at home with wraparound  support services.   PAST SURGICAL HISTORY:  1. Tonsillectomy in 1959.  2. Infertility surgery in 1994.  3. Nasal surgery for correction of deviated septum in 1998.  4. Breast biopsy in 1998, followed by lumpectomy for breast cancer,      with a breast-sparing procedure, followed by radiation therapy and      then tamoxifen.  She was hormone receptor positive.   PAST MEDICAL HISTORY:  1. Usual childhood diseases.  2. Episode of shingles.  3. Breast cancer, as noted below.  4. Migraines when she was in the 8th and 9th grade, now resolved.  5. Urinary tract infections.   CURRENT MEDICATIONS:  The patient takes no chronic medications.  She is  completing a course of Bactrim for a recent UTI.   HABITS:  Tobacco:  None.  Alcohol:  Rare.   The patient has no known drug allergies.   FAMILY HISTORY:  Both parents with significant arthritis.  Mother with  ovarian cancer, as noted.  Father had prostate cancer but survived.  No  history of colon cancer, lung cancer, hypertension, heart disease, or  diabetes.   SOCIAL HISTORY:  The patient is a Buyer, retail of [employer].  She works in her [employer], but is able to work part  time and from home.  She has been married 31 years.  She has a 14-year-  old adopted daughter.  She reports her marriage is in good health and  actually is standing up to the current strain well.  She reports her  husband is very supportive.   HEALTH MAINTENANCE:  The patient reports her last pelvic exam was  February 2008 with Dr. Miguel Aschoff.  She does follow in the [facility] and is seen every 6 months, and is current and up to date.  The patient has never had  colorectal cancer screening.   REVIEW OF SYSTEMS:  The patient has had no fevers, sweats, chills, or  other constitutional symptoms.  She has had an eye exam in the last 24  months.  No ENT complaints.  She does have rare cardiac palpitations,  but these are asymptomatic.  She has had no respiratory or GI problems.  She reports she did have a history of symptomatic fibroids, but since  her breast cancer therapy this has not been a problem.  No  musculoskeletal or dermatologic or neurologic complaints.   PHYSICAL EXAMINATION:  VITAL SIGNS:  Temperature was 96.5, blood  pressure 108/75, pulse 67, weight 128.  GENERAL APPEARANCE:  This is a well-nourished, well-developed woman,  looking younger than her state chronologic age, in no acute distress.  HEENT:  Normocephalic, atraumatic.  EACs and TMs were unremarkable.  Oropharynx with native dentition in good repair.  No buccal or palatal  lesions were noted.  Posterior pharynx was clear.  Conjunctiva and  sclera was clear.  PERRLA.  EOMI.  Funduscopic exam was unremarkable.  NECK:  Supple.  There was no  thyromegaly.  NODES:  No adenopathy was noted in the submental, cervical, or  supraclavicular regions.  CHEST:  No CVA tenderness.  No deformities are noted.  LUNGS:  Clear, with no rales, wheezes, or rhonchi.  BREASTS:  Deferred to the patient's oncologist.  CARDIOVASCULAR:  2+ radial pulses.  No JVD or carotid bruits.  She had a  quiet precordium, with a regular rate and rhythm, with no murmurs, rubs,  or gallops.  ABDOMEN:  Soft.  No guarding or rebound.  There was no  hepatosplenomegaly.  PELVIC/RECTAL:  Deferred to gynecology.  EXTREMITIES:  Without clubbing, cyanosis, edema, or deformity.  NEUROLOGIC:  Grossly nonfocal.  The patient has flattening of the right  nasolabial fold, but otherwise good facial movement.   DATABASE:  Hemoglobin was 14.7 g, white count was 3900, with a normal  differential.  Chemistries were unremarkable, with a serum glucose of  84, creatinine 0.8, with an estimated GFR if 80 mL/minute.  Liver  functions were normal.  Cholesterol 183, triglycerides 68, HDL 50.7, LDL  was 119.  Thyroid function normal, with a TSH of 1.53.  Urinalysis was  negative.   ASSESSMENT AND PLAN:  1. Oncology.  The patient is a breast cancer survivor, now out 10      years from lumpectomy and radiation therapy and has completed      tamoxifen.  Plan is for her to follow up at [facility] as scheduled.  2. Health maintenance.  The patient does keep current with her      gynecologist.  Her laboratory and examination today are      unremarkable, and she does appear to be very healthy.  3. I have recommended that the patient have colorectal cancer      screening with full colonoscopy.  However, given her current home      situation and the care of her mother, we can certainly delay this,      given that the patient is asymptomatic, but I would recommend that      we attend to this procedure in the next 12-18 months.  In summary, a delightful patient with no active medical problems.  I   have asked her to return to see me in 12-18 months for routine followup.  Otherwise, I would be happy to see her on a p.r.n. basis.     Rosalyn Gess  Norins, MD  Electronically Signed    MEN/MedQ  DD: 11/04/2006  DT: 11/04/2006  Job #: [id number]   cc:   Lucilla Lame

## 2011-03-18 LAB — BASIC METABOLIC PANEL
BUN: 15
Creatinine, Ser: 1
GFR calc non Af Amer: 58 — ABNORMAL LOW
Glucose, Bld: 90

## 2011-03-18 LAB — DIFFERENTIAL
Basophils Absolute: 0.1
Eosinophils Absolute: 0.3
Eosinophils Relative: 8 — ABNORMAL HIGH
Lymphocytes Relative: 27
Lymphs Abs: 1.2
Neutrophils Relative %: 54

## 2011-03-18 LAB — CBC
MCV: 87.8
Platelets: 179
RDW: 12
WBC: 4.4

## 2011-03-18 LAB — POCT CARDIAC MARKERS: Troponin i, poc: <0.05

## 2011-03-18 LAB — D-DIMER, QUANTITATIVE: D-Dimer, Quant: <0.22

## 2012-04-06 ENCOUNTER — Encounter: Payer: Self-pay | Admitting: Internal Medicine

## 2012-04-06 ENCOUNTER — Ambulatory Visit (INDEPENDENT_AMBULATORY_CARE_PROVIDER_SITE_OTHER)
Admission: RE | Admit: 2012-04-06 | Discharge: 2012-04-06 | Disposition: A | Discharge status: Home / Self Care | Payer: BC Managed Care – PPO | Source: Ambulatory Visit | Attending: Internal Medicine | Admitting: Internal Medicine

## 2012-04-06 ENCOUNTER — Ambulatory Visit (INDEPENDENT_AMBULATORY_CARE_PROVIDER_SITE_OTHER): Payer: BC Managed Care – PPO | Admitting: Internal Medicine

## 2012-04-06 VITALS — BP 90/60 | HR 84 | Temp 97.6°F | Resp 16 | Wt 108.0 lb

## 2012-04-06 DIAGNOSIS — M545 Low back pain, unspecified: Secondary | ICD-10-CM

## 2012-04-06 NOTE — Patient Instructions (Addendum)
Low back pain - on exam there is no evidence of any disk injury or major arthritic change Plan - lumbar-sacral spine x-rays  [email or web address] for good back exercises  Tylenol is helpful when taken on a regular schedule, e.g. 500 mg three times a day  Therapeutic massage is always good.  Hand movement - the exam today is normal. If this continues to be a problem will need to consider neurology consult.   Please reschedule colonoscopy.

## 2012-04-06 NOTE — Progress Notes (Signed)
  Subjective:    Patient ID: Cheryl Lawson, female    DOB: [birth year], 62 y.o.   MRN: [id number]  HPI Mrs. Winterton has been working her back hard this summer and has had some back pain. The pain has gotten worse and she feels a bulge in the area of the right SI joint. No focal weakness, no paresthesia in the lower extremities.  She is doing limited exercise but nothing back specific. She is not limited in her activities.  She has had a new issue with involuntary movement of the 1st-3rd digits right hand. NO focal weakness or paresthesia.   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) UTI'S, HX OF (ICD-V13.00) Hx of MIGRAINE HEADACHE (ICD-346.90) ADENOCARCINOMA, BREAST, HX OF (ICD-V10.3) SHINGLES, HX OF (ICD-V13.8) RADIATION THERAPY, HX OF (ICD-V15.9) HEALTHY ADULT FEMALE (ICD-V70.0) SPRAIN AND STRAIN OF SACROILIAC (ICD-846.1)    Past Surgical History: * NASAL SURGERY FOR DEVIATED SEPTUM * LUMPECTOMY FOR BREAST CANCER BREAST BIOPSY, HX OF (ICD-V15.9) * INFERTILITY SURGERY TONSILLECTOMY, HX OF (ICD-V45.79) PLASTIC SURGERY WITH RHINOPLASTY, NECK TIGHTENING  Fall '11  Family History: Father - 1930: hypothyroid, hypertension, prostate cancer Mother - deceased @ 63: ovarian cancer; hypothroid after goiter surgery Neg - breast cancer, colon cancer, DM, CAD/MI  Social History: HSG Married - '77 great niece 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 no history of physical or sexual abuse   Review of Systems System review is negative for any constitutional, cardiac, pulmonary, GI or neuro symptoms or complaints other than as described in the HPI.     Objective:   Physical Exam Filed Vitals:   04/06/12 1151  BP: 90/60  Pulse: 84  Temp: 97.6 F (36.4 C)  Resp: 16   Wt Readings from Last 3 Encounters:  04/06/12 108 lb (48.988 kg)  06/25/10 110 lb (49.896 kg)  02/20/10 121 lb (54.885 kg)    Gen'l - very slender white woman in no distress HEENT - C&S clear, PERRLA Neck- supple Cor- 2+ radial, RRR Pulm - normal respirations Back exam: normal stand; normal flex to greater than 100 degrees; normal gait; normal toe/heel walk; normal step up to exam table; normal SLR sitting; normal DTRs at the patellar tendons; normal sensation to light touch, pin-prick and deep vibratory stimulus; no  CVA tenderness; able to move supine to sitting without assistance. Tender to palpation over the right SI joint. Spine is straight.  L-S spine series: IMPRESSION:  1. No acute finding.  2. Mild convex right scoliosis.  3. Facet degenerative disease L5-S1.  4. Calcified uterine fibroids.         Assessment & Plan:  Low back pain - on exam there is no evidence of any disk injury or major arthritic change Plan - lumbar-sacral spine x-rays - no acute finding  [email or web address] for good back exercises  Tylenol is helpful when taken on a regular schedule, e.g. 500 mg three times a day  Therapeutic massage is always good.  Hand movement - the exam today is normal. If this continues to be a problem will need to consider neurology consult.

## 2012-04-08 ENCOUNTER — Encounter: Payer: Self-pay | Admitting: Internal Medicine

## 2012-04-12 ENCOUNTER — Encounter: Payer: Self-pay | Admitting: Internal Medicine

## 2012-04-15 ENCOUNTER — Encounter: Payer: Self-pay | Admitting: Gastroenterology

## 2012-05-04 ENCOUNTER — Encounter: Payer: Self-pay | Admitting: Internal Medicine

## 2012-05-04 ENCOUNTER — Other Ambulatory Visit (INDEPENDENT_AMBULATORY_CARE_PROVIDER_SITE_OTHER): Payer: BC Managed Care – PPO

## 2012-05-04 ENCOUNTER — Ambulatory Visit (INDEPENDENT_AMBULATORY_CARE_PROVIDER_SITE_OTHER): Payer: BC Managed Care – PPO | Admitting: Internal Medicine

## 2012-05-04 VITALS — BP 102/62 | HR 73 | Temp 97.1°F | Resp 12 | Wt 107.8 lb

## 2012-05-04 DIAGNOSIS — E041 Nontoxic single thyroid nodule: Secondary | ICD-10-CM

## 2012-05-04 NOTE — Progress Notes (Signed)
  Subjective:    Patient ID: Cheryl Lawson, female    DOB: June 08, 1951, 61 y.o.   MRN: [id number]  HPI Cheryl Lawson presents with a concern for possible thyroid disease. She has noted enlargement of the upper neck - which is more at the level of the cricoid cartilage. Her last TSH in the office was 1.5 but recently at a dermatologist office in [place] her TSH was 3.6. She has had weight and hair loss.  History reviewed. No pertinent past medical history. History reviewed. No pertinent past surgical history. History reviewed. No pertinent family history. History   Social History  . Marital Status: Married    Spouse Name: N/A    Number of Children: 1  . Years of Education: 121   Occupational History  .     Social History Main Topics  . Smoking status: Never Smoker   . Smokeless tobacco: Never Used  . Alcohol Use: Yes     Comment: rare  . Drug Use: No  . Sexually Active: Yes -- Female partner(s)   Other Topics Concern  . Not on file   Social History Narrative   HSG. Married - '77. great niece lives with her. 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 no history of physical or sexual abuse.        Review of Systems System review is negative for any constitutional, cardiac, pulmonary, GI or neuro symptoms or complaints other than as described in the HPI.      Objective:   Physical Exam Filed Vitals:   05/04/12 1358  BP: 102/62  Pulse: 73  Temp: 97.1 F (36.2 C)  Resp: 12   Gen'l- Slender white woman in no distress HEENT- C&S clear Neck - prominent cricoid cartilage, easily palpable thyroid with possible nodule in right thyroid lobe. Non-tender. Cor - RRR PUlm - normal Neuor A&O x 3,        Assessment & Plan:  Thyroid nodule - patient recently had botox injections both sides anterior neck and now has a more noticeable cricoid cartilage. Thyroid gland is normal size but there is a palpable nodule right lobe. TSH 1.5 in '08, 3.6  '12  Plan Lab: TSH, FT4  U/S soft tissue neck.

## 2012-05-04 NOTE — Patient Instructions (Addendum)
The area of enlargement that you see is the cartilage of the windpipe and sites much higher than the thyroid gland. The gland is of normal size but there may be a small nodule in the right lobe of the gland.  Plan Lab: TSH and free T4 to assess thyroid function  U/S of the soft tissue of the neck to assess the size of the thyroid and whether any nodule(s) is present.  Thyroid Diseases Your thyroid is a butterfly-shaped gland in your neck. It is located just above your collarbone. It is one of your endocrine glands, which make hormones. The thyroid helps set your metabolism. Metabolism is how your body gets energy from the foods you eat.   Millions of people have thyroid diseases. Women experience thyroid problems more often than men. In fact, overactive thyroid problems (hyperthyroidism) occur in 1% of all women. If you have a thyroid disease, your body may use energy more slowly or quickly than it should.   Thyroid problems also include an immune disease where your body reacts against your thyroid gland (called thyroiditis). A different problem involves lumps and bumps (called nodules) that develop in the gland. The nodules are usually, but not always, noncancerous. THE MOST COMMON THYROID PROBLEMS AND CAUSES ARE DISCUSSED BELOW There are many causes for thyroid problems. Treatment depends upon the exact diagnosis and includes trying to reset your body's metabolism to a normal rate. Hyperthyroidism Too much thyroid hormone from an overactive thyroid gland is called hyperthyroidism. In hyperthyroidism, the body's metabolism speeds up. One of the most frequent forms of hyperthyroidism is known as Graves' disease. Graves' disease tends to run in families. Although Graves' is thought to be caused by a problem with the immune system, the exact nature of the genetic problem is unknown. Hypothyroidism Too little thyroid hormone from an underactive thyroid gland is called hypothyroidism. In hypothyroidism,  the body's metabolism is slowed. Several things can cause this condition. Most causes affect the thyroid gland directly and hurt its ability to make enough hormone.   Rarely, there may be a pituitary gland tumor (located near the base of the brain). The tumor can block the pituitary from producing thyroid-stimulating hormone (TSH). Your body makes TSH to stimulate the thyroid to work properly. If the pituitary does not make enough TSH, the thyroid fails to make enough hormones needed for good health. Whether the problem is caused by thyroid conditions or by the pituitary gland, the result is that the thyroid is not making enough hormones. Hypothyroidism causes many physical and mental processes to become sluggish. The body consumes less oxygen and produces less body heat. Thyroid Nodules A thyroid nodule is a small swelling or lump in the thyroid gland. They are common. These nodules represent either a growth of thyroid tissue or a fluid-filled cyst. Both form a lump in the thyroid gland. Almost half of all people will have tiny thyroid nodules at some point in their lives. Typically, these are not noticeable until they become large and affect normal thyroid size. Larger nodules that are greater than a half inch across (about 1 centimeter) occur in about 5 percent of people. Although most nodules are not cancerous, people who have them should seek medical care to rule out cancer. Also, some thyroid nodules may produce too much thyroid hormone or become too large. Large nodules or a large gland can interfere with breathing or swallowing or may cause neck discomfort. Other problems Other thyroid problems include cancer and thyroiditis. Thyroiditis is a  malfunction of the body's immune system. Normally, the immune system works to defend the body against infection and other problems. When the immune system is not working properly, it may mistakenly attack normal cells, tissues, and organs. Examples of autoimmune  diseases are Hashimoto's thyroiditis (which causes low thyroid function) and Graves' disease (which causes excess thyroid function). SYMPTOMS   Symptoms vary greatly depending upon the exact type of problem with the thyroid. Hyperthyroidism-is when your thyroid is too active and makes more thyroid hormone than your body needs. The most common cause is Graves' Disease. Too much thyroid hormone can cause some or all of the following symptoms:  Anxiety.   Irritability.   Difficulty sleeping.   Fatigue.   A rapid or irregular heartbeat.   A fine tremor of your hands or fingers.   An increase in perspiration.   Sensitivity to heat.   Weight loss, despite normal food intake.   Brittle hair.   Enlargement of your thyroid gland (goiter).   Light menstrual periods.   Frequent bowel movements.  Graves' disease can specifically cause eye and skin problems. The skin problems involve reddening and swelling of the skin, often on your shins and on the top of your feet. Eye problems can include the following:  Excess tearing and sensation of grit or sand in either or both eyes.   Reddened or inflamed eyes.   Widening of the space between your eyelids.   Swelling of the lids and tissues around the eyes.   Light sensitivity.   Ulcers on the cornea.   Double vision.   Limited eye movements.   Blurred or reduced vision.  Hypothyroidism- is when your thyroid gland is not active enough. This is more common than hyperthyroidism. Symptoms can vary a lot depending of the severity of the hormone deficiency. Symptoms may develop over a long period of time and can include several of the following:  Fatigue.   Sluggishness.   Increased sensitivity to cold.   Constipation.   Pale, dry skin.   A puffy face.   Hoarse voice.   High blood cholesterol level.   Unexplained weight gain.   Muscle aches, tenderness and stiffness.   Pain, stiffness or swelling in your joints.    Muscle weakness.   Heavier than normal menstrual periods.   Brittle fingernails and hair.   Depression.  Thyroid Nodules - most do not cause signs or symptoms. Occasionally, some may become so large that you can feel or even see the swelling at the base of your neck. You may realize a lump or swelling is there when you are shaving or putting on makeup. Men might become aware of a nodule when shirt collars suddenly feel too tight. Some nodules produce too much thyroid hormone. This can produce the same symptoms as hyperthyroidism (see above). Thyroid nodules are seldom cancerous. However, a nodule is more likely to be malignant (cancerous) if it:  Grows quickly or feels hard.   Causes you to become hoarse or to have trouble swallowing or breathing.   Causes enlarged lymph nodes under your jaw or in your neck.  DIAGNOSIS   Because there are so many possible thyroid conditions, your caregiver may ask for a number of tests. They will do this in order to narrow down the exact diagnosis. These tests can include:  Blood and antibody tests.   Special thyroid scans using small, safe amounts of radioactive iodine.   Ultrasound of the thyroid gland (particularly if there is a  nodule or lump).   Biopsy. This is usually done with a special needle. A needle biopsy is a procedure to obtain a sample of cells from the thyroid. The tissue will be tested in a lab and examined under a microscope.  TREATMENT   Treatment depends on the exact diagnosis. Hyperthyroidism  Beta-blockers help relieve many of the symptoms.   Anti-thyroid medications prevent the thyroid from making excess hormones.   Radioactive iodine treatment can destroy overactive thyroid cells. The iodine can permanently decrease the amount of hormone produced.   Surgery to remove the thyroid gland.   Treatments for eye problems that come from Graves' disease also include medications and special eye surgery, if felt to be  appropriate.  Hypothyroidism Thyroid replacement with levothyroxine is the mainstay of treatment. Treatment with thyroid replacement is usually lifelong and will require monitoring and adjustment from time to time. Thyroid Nodules  Watchful waiting. If a small nodule causes no symptoms or signs of cancer on biopsy, then no treatment may be chosen at first. Re-exam and re-checking blood tests would be the recommended follow-up.   Anti-thyroid medications or radioactive iodine treatment may be recommended if the nodules produce too much thyroid hormone (see Treatment for Hyperthyroidism above).   Alcohol ablation. Injections of small amounts of ethyl alcohol (ethanol) can cause a non-cancerous nodule to shrink in size.   Surgery (see Treatment for Hyperthyroidism above).  HOME CARE INSTRUCTIONS    Take medications as instructed.   Follow through on recommended testing.  SEEK MEDICAL CARE IF:    You feel that you are developing symptoms of Hyperthyroidism or Hypothyroidism as described above.   You develop a new lump/nodule in the neck/thyroid area that you had not noticed before.   You feel that you are having side effects from medicines prescribed.   You develop trouble breathing or swallowing.  SEEK IMMEDIATE MEDICAL CARE IF:    You develop a fever of 102 F (38.9 C) or higher.   You develop severe sweating.   You develop palpitations and/or rapid heart beat.   You develop shortness of breath.   You develop nausea and vomiting.   You develop extreme shakiness.   You develop agitation.   You develop lightheadedness or have a fainting episode.  Document Released: 03/30/2007 Document Revised: 08/25/2011 Document Reviewed: 03/30/2007 [facility] Patient Information 2013 [place], [place].

## 2012-05-07 ENCOUNTER — Ambulatory Visit
Admission: RE | Admit: 2012-05-07 | Discharge: 2012-05-07 | Disposition: A | Discharge status: Home / Self Care | Payer: BC Managed Care – PPO | Source: Ambulatory Visit | Attending: Internal Medicine | Admitting: Internal Medicine

## 2012-05-07 DIAGNOSIS — E041 Nontoxic single thyroid nodule: Secondary | ICD-10-CM

## 2012-05-19 ENCOUNTER — Ambulatory Visit (AMBULATORY_SURGERY_CENTER): Payer: BC Managed Care – PPO

## 2012-05-19 ENCOUNTER — Encounter: Payer: Self-pay | Admitting: Gastroenterology

## 2012-05-19 VITALS — Ht 64.5 in | Wt 108.0 lb

## 2012-05-19 DIAGNOSIS — Z1211 Encounter for screening for malignant neoplasm of colon: Secondary | ICD-10-CM

## 2012-05-27 ENCOUNTER — Encounter (HOSPITAL_BASED_OUTPATIENT_CLINIC_OR_DEPARTMENT_OTHER): Payer: Self-pay | Admitting: *Deleted

## 2012-05-27 ENCOUNTER — Emergency Department (HOSPITAL_BASED_OUTPATIENT_CLINIC_OR_DEPARTMENT_OTHER)
Admission: EM | Admit: 2012-05-27 | Discharge: 2012-05-27 | Disposition: A | Discharge status: Home / Self Care | Payer: BC Managed Care – PPO | Attending: Emergency Medicine | Admitting: Emergency Medicine

## 2012-05-27 DIAGNOSIS — N390 Urinary tract infection, site not specified: Secondary | ICD-10-CM | POA: Insufficient documentation

## 2012-05-27 LAB — URINALYSIS, ROUTINE W REFLEX MICROSCOPIC
Glucose, UA: NEGATIVE mg/dL
Protein, ur: NEGATIVE mg/dL
Specific Gravity, Urine: 1.003 — ABNORMAL LOW (ref 1.005–1.030)
pH: 6 (ref 5.0–8.0)

## 2012-05-27 LAB — URINE MICROSCOPIC-ADD ON

## 2012-05-27 MED ORDER — PHENAZOPYRIDINE HCL 200 MG PO TABS: 200.0000 mg | ORAL_TABLET | Freq: Three times a day (TID) | ORAL | Status: DC

## 2012-05-27 MED ORDER — CIPROFLOXACIN HCL 500 MG PO TABS: 500.0000 mg | ORAL_TABLET | Freq: Two times a day (BID) | ORAL | Status: DC

## 2012-05-27 NOTE — ED Provider Notes (Signed)
History     CSN: [id number]  Arrival date & time 05/27/12  8469   First MD Initiated Contact with Patient 05/27/12 [phone]      Chief Complaint  Patient presents with  . Urinary Tract Infection    (Consider location/radiation/quality/duration/timing/severity/associated sxs/prior treatment) Patient is a 61 y.o. female presenting with urinary tract infection. The history is provided by the patient and medical records. No language interpreter was used.  Urinary Tract Infection This is a new (Pt woke up with burning on urination.  She had had similar symptoms a week ago, was Rx'd with macrodantin called in by her GYN.  Finshed that Rx a couple of days ago.) problem. The current episode started 1 to 2 hours ago. The problem occurs constantly. The problem has not changed since onset.Pertinent negatives include no abdominal pain. Associated symptoms comments: None.. Nothing aggravates the symptoms. Nothing relieves the symptoms. She has tried nothing (She had been treated with macrodantin, had finished that prescription a few days ago.) for the symptoms.    History reviewed. No pertinent past medical history.  Past Surgical History  Procedure Date  . Nasal septum surgery   . Infertility surgery   . Tonsillectomy   . Breast lumpectomy     right  . Neck surgery     lift     Family History  Problem Relation Age of Onset  . Ovarian cancer Mother   . Colon cancer Mother     History  Substance Use Topics  . Smoking status: Never Smoker   . Smokeless tobacco: Never Used  . Alcohol Use: Yes     Comment: rare    OB History    Grav Para Term Preterm Abortions TAB SAB Ect Mult Living                  Review of Systems  Constitutional: Negative for fever and chills.  HENT: Negative.  Negative for ear pain and sore throat.   Eyes: Negative.   Respiratory: Negative for cough.   Cardiovascular: Negative.   Gastrointestinal: Negative.  Negative for abdominal pain.  Genitourinary:  Positive for dysuria. Negative for flank pain and vaginal discharge.  Musculoskeletal: Negative.   Skin: Negative.   Neurological: Negative.   Psychiatric/Behavioral: Negative.     Allergies  Review of patient's allergies indicates no known allergies.  Home Medications   Current Outpatient Rx  Name  Route  Sig  Dispense  Refill  . CLOBETASOL PROPIONATE 0.05 % EX SOLN               . NITROFURANTOIN MONOHYD MACRO 100 MG PO CAPS                 BP 108/74  Pulse 61  Temp 97.4 F (36.3 C) (Oral)  Resp 20  Ht 5\' 5"  (1.651 m)  Wt 108 lb (48.988 kg)  BMI 17.97 kg/m2  SpO2 100%  Physical Exam  Constitutional: She is oriented to person, place, and time. She appears well-developed and well-nourished. No distress.  Neck: Normal range of motion. Neck supple.  Cardiovascular: Normal rate, regular rhythm and normal heart sounds.   Pulmonary/Chest: Effort normal and breath sounds normal.  Abdominal: Soft. Bowel sounds are normal. She exhibits no distension. There is no tenderness.       Minimal suprapubic tenderness.  Musculoskeletal: Normal range of motion.       No CVA tenderness.  Neurological: She is alert and oriented to person, place, and time.  No sensory or motor deficit.  Skin: Skin is warm and dry.  Psychiatric: She has a normal mood and affect. Her behavior is normal.    ED Course  Procedures (including critical care time)  8:44 AM Results for orders placed during the hospital encounter of 05/27/12  URINALYSIS, ROUTINE W REFLEX MICROSCOPIC      Component Value Range   Color, Urine YELLOW  YELLOW   APPearance CLEAR  CLEAR   Specific Gravity, Urine 1.003 (*) 1.005 - 1.030   pH 6.0  5.0 - 8.0   Glucose, UA NEGATIVE  NEGATIVE mg/dL   Hgb urine dipstick LARGE (*) NEGATIVE   Bilirubin Urine NEGATIVE  NEGATIVE   Ketones, ur NEGATIVE  NEGATIVE mg/dL   Protein, ur NEGATIVE  NEGATIVE mg/dL   Urobilinogen, UA 0.2  0.0 - 1.0 mg/dL   Nitrite NEGATIVE  NEGATIVE    Leukocytes, UA SMALL (*) NEGATIVE  URINE MICROSCOPIC-ADD ON      Component Value Range   Squamous Epithelial / LPF FEW (*) RARE   WBC, UA 11-20  <3 WBC/hpf   RBC / HPF 0-2  <3 RBC/hpf   Bacteria, UA FEW (*) RARE   Rx UTI with cipro, pyridium.   1. Urinary tract infection       Carleene Cooper III, MD 05/27/12 [phone]

## 2012-05-27 NOTE — ED Notes (Signed)
Patient states she was treated for UTI 7-10 days ago with macrobid, which was completed on Friday.  States yesterday she felt better.  Woke up this morning with urinary urgency, frequency and burning with urination.

## 2012-05-28 ENCOUNTER — Telehealth: Payer: Self-pay | Admitting: Gastroenterology

## 2012-05-28 LAB — URINE CULTURE: Colony Count: 3000

## 2012-05-31 NOTE — Telephone Encounter (Signed)
Pt began Cipro and last dose will be the day of the procedure she was advised that she can proceed with her procedure as planned

## 2012-06-02 ENCOUNTER — Encounter: Payer: Self-pay | Admitting: Gastroenterology

## 2012-06-02 ENCOUNTER — Ambulatory Visit (AMBULATORY_SURGERY_CENTER): Payer: BC Managed Care – PPO | Admitting: Gastroenterology

## 2012-06-02 VITALS — BP 128/71 | HR 67 | Temp 97.3°F | Resp 17 | Ht 64.5 in | Wt 108.0 lb

## 2012-06-02 DIAGNOSIS — Z1211 Encounter for screening for malignant neoplasm of colon: Secondary | ICD-10-CM

## 2012-06-02 MED ORDER — SODIUM CHLORIDE 0.9 % IV SOLN: 500.0000 mL | INTRAVENOUS | Status: DC

## 2012-06-02 NOTE — Progress Notes (Signed)
Pt's BP 67/57, cuff readjusted several times on leg.  BP 153/66

## 2012-06-02 NOTE — Progress Notes (Signed)
Patient did not experience any of the following events: a burn prior to discharge; a fall within the facility; wrong site/side/patient/procedure/implant event; or a hospital transfer or hospital admission upon discharge from the facility. (G8907) Patient did not have preoperative order for IV antibiotic SSI prophylaxis. (G8918)  

## 2012-06-02 NOTE — Patient Instructions (Addendum)
YOU HAD AN ENDOSCOPIC PROCEDURE TODAY AT THE [facility]: Refer to the procedure report that was given to you for any specific questions about what was found during the examination.  If the procedure report does not answer your questions, please call your gastroenterologist to clarify.  If you requested that your care partner not be given the details of your procedure findings, then the procedure report has been included in a sealed envelope for you to review at your convenience later.  YOU SHOULD EXPECT: Some feelings of bloating in the abdomen. Passage of more gas than usual.  Walking can help get rid of the air that was put into your GI tract during the procedure and reduce the bloating. If you had a lower endoscopy (such as a colonoscopy or flexible sigmoidoscopy) you may notice spotting of blood in your stool or on the toilet paper. If you underwent a bowel prep for your procedure, then you may not have a normal bowel movement for a few days.  DIET: Your first meal following the procedure should be a light meal and then it is ok to progress to your normal diet.  A half-sandwich or bowl of soup is an example of a good first meal.  Heavy or fried foods are harder to digest and may make you feel nauseous or bloated.  Likewise meals heavy in dairy and vegetables can cause extra gas to form and this can also increase the bloating.  Drink plenty of fluids but you should avoid alcoholic beverages for 24 hours.  ACTIVITY: Your care partner should take you home directly after the procedure.  You should plan to take it easy, moving slowly for the rest of the day.  You can resume normal activity the day after the procedure however you should NOT DRIVE or use heavy machinery for 24 hours (because of the sedation medicines used during the test).    SYMPTOMS TO REPORT IMMEDIATELY: A gastroenterologist can be reached at any hour.  During normal business hours, 8:30 AM to 5:00 PM Monday through Friday,  call [phone].  After hours and on weekends, please call the GI answering service at [phone] who will take a message and have the physician on call contact you.   Following lower endoscopy (colonoscopy or flexible sigmoidoscopy):  Excessive amounts of blood in the stool  Significant tenderness or worsening of abdominal pains  Swelling of the abdomen that is new, acute  Fever of 100F or higher  Following upper endoscopy (EGD)  Vomiting of blood or coffee ground material  New chest pain or pain under the shoulder blades  Painful or persistently difficult swallowing  New shortness of breath  Fever of 100F or higher  Black, tarry-looking stools  FOLLOW UP: If any biopsies were taken you will be contacted by phone or by letter within the next 1-3 weeks.  Call your gastroenterologist if you have not heard about the biopsies in 3 weeks.  Our staff will call the home number listed on your records the next business day following your procedure to check on you and address any questions or concerns that you may have at that time regarding the information given to you following your procedure. This is a courtesy call and so if there is no answer at the home number and we have not heard from you through the emergency physician on call, we will assume that you have returned to your regular daily activities without incident.  SIGNATURES/CONFIDENTIALITY: You and/or your care   partner have signed paperwork which will be entered into your electronic medical record.  These signatures attest to the fact that that the information above on your After Visit Summary has been reviewed and is understood.  Full responsibility of the confidentiality of this discharge information lies with you and/or your care-partner.  

## 2012-06-02 NOTE — Op Note (Signed)
[facility] [address] [place], [address]   COLONOSCOPY PROCEDURE REPORT  PATIENT: Cheryl Lawson, Cheryl Lawson.  MR#: [id number] BIRTHDATE: [birth year] , 61  yrs. old GENDER: Female ENDOSCOPIST: Rachael Fee, MD REFERRED AO:[id number] Esther Hardy, M.D. PROCEDURE DATE:  06/02/2012 PROCEDURE:   Colonoscopy, diagnostic ASA CLASS:   Class II INDICATIONS:average risk screening. MEDICATIONS: Fentanyl 50 mcg IV, Versed 6 mg IV, and These medications were titrated to patient response per physician's verbal order  DESCRIPTION OF PROCEDURE:   After the risks benefits and alternatives of the procedure were thoroughly explained, informed consent was obtained.  A digital rectal exam revealed no abnormalities of the rectum.   The LB [id number]  endoscope was introduced through the anus and advanced to the cecum, which was identified by both the appendix and ileocecal valve. No adverse events experienced.   The quality of the prep was good, using MoviPrep  The instrument was then slowly withdrawn as the colon was fully examined.  COLON FINDINGS: A normal appearing cecum, ileocecal valve, and appendiceal orifice were identified.  The ascending, hepatic flexure, transverse, splenic flexure, descending, sigmoid colon and rectum appeared unremarkable.  No polyps or cancers were seen. Retroflexed views revealed no abnormalities. The time to cecum=3 minutes 05 seconds.  Withdrawal time=7 minutes 32 seconds.  The scope was withdrawn and the procedure completed. COMPLICATIONS: There were no complications.  ENDOSCOPIC IMPRESSION: Normal colon No polyps or cancers  RECOMMENDATIONS: You should continue to follow colorectal cancer screening guidelines for "routine risk" patients with a repeat colonoscopy in 10 years. There is no need for FOBT (stool) testing for at least 5 years.   eSigned:  Rachael Fee, MD 06/02/2012 11:06 AM

## 2012-06-03 ENCOUNTER — Telehealth: Payer: Self-pay | Admitting: *Deleted

## 2012-06-03 NOTE — Telephone Encounter (Signed)
  Follow up Call-  Call back number 06/02/2012  Post procedure Call Back phone  # [phone]  Permission to leave phone message Yes     Patient questions:  Do you have a fever, pain , or abdominal swelling? no Pain Score  0 *  Have you tolerated food without any problems? yes  Have you been able to return to your normal activities? yes  Do you have any questions about your discharge instructions: Diet   no Medications  no Follow up visit  no  Do you have questions or concerns about your Care? no  Actions: * If pain score is 4 or above: No action needed, pain <4.

## 2012-07-31 ENCOUNTER — Other Ambulatory Visit: Payer: Self-pay

## 2012-12-27 ENCOUNTER — Encounter: Payer: Self-pay | Admitting: Internal Medicine

## 2012-12-27 ENCOUNTER — Ambulatory Visit (INDEPENDENT_AMBULATORY_CARE_PROVIDER_SITE_OTHER): Payer: BC Managed Care – PPO | Admitting: Internal Medicine

## 2012-12-27 ENCOUNTER — Other Ambulatory Visit (INDEPENDENT_AMBULATORY_CARE_PROVIDER_SITE_OTHER): Payer: BC Managed Care – PPO

## 2012-12-27 VITALS — BP 118/86 | HR 78 | Temp 96.8°F | Ht 65.0 in | Wt 102.0 lb

## 2012-12-27 DIAGNOSIS — R634 Abnormal weight loss: Secondary | ICD-10-CM

## 2012-12-27 DIAGNOSIS — Z01818 Encounter for other preprocedural examination: Secondary | ICD-10-CM

## 2012-12-27 DIAGNOSIS — E041 Nontoxic single thyroid nodule: Secondary | ICD-10-CM

## 2012-12-27 LAB — COMPREHENSIVE METABOLIC PANEL
AST: 24 U/L (ref 0–37)
Albumin: 4.2 g/dL (ref 3.5–5.2)
Alkaline Phosphatase: 54 U/L (ref 39–117)
BUN: 22 mg/dL (ref 6–23)
Potassium: 4.2 mEq/L (ref 3.5–5.1)
Sodium: 139 mEq/L (ref 135–145)
Total Protein: 6.9 g/dL (ref 6.0–8.3)

## 2012-12-27 LAB — HEPATIC FUNCTION PANEL
ALT: 15 U/L (ref 0–35)
Bilirubin, Direct: 0 mg/dL (ref 0.0–0.3)
Total Protein: 6.9 g/dL (ref 6.0–8.3)

## 2012-12-27 LAB — CBC WITH DIFFERENTIAL/PLATELET
Basophils Absolute: 0 10*3/uL (ref 0.0–0.1)
Basophils Relative: 0.8 % (ref 0.0–3.0)
Eosinophils Absolute: 0.2 10*3/uL (ref 0.0–0.7)
Lymphocytes Relative: 15.5 % (ref 12.0–46.0)
MCHC: 33.8 g/dL (ref 30.0–36.0)
Neutrophils Relative %: 72.8 % (ref 43.0–77.0)
Platelets: 199 10*3/uL (ref 150.0–400.0)
RBC: 4.85 Mil/uL (ref 3.87–5.11)
RDW: 12.9 % (ref 11.5–14.6)

## 2012-12-27 NOTE — Progress Notes (Signed)
Subjective:    Patient ID: Cheryl Lawson, female    DOB: Mar 25, 1951, 62 y.o.   MRN: [id number]  HPI Cheryl Lawson presents with a concern for weight loss. She has lost 30 lbs since '09. Her weight was 108 for a couple of years and is now down to 102. She does have a good appetite and no GI complaints. She has had recent colonoscopy; she is followed closely at [facility] for h/o malignant breast lump; she was BRCA 1 & 2 negative; her mother had ovarian cancer and she herself has had a transvaginal u/s in the last several years. She denies any night sweats or rigors, no lymphadenopathy.  She is for revision nasal surgery and needs pre-op Clearance. She had a normal EKG in '11. She has not had recent lab work.   \ History reviewed. No pertinent past medical history. Past Surgical History  Procedure Laterality Date  . Nasal septum surgery    . Infertility surgery    . Tonsillectomy    . Breast lumpectomy      right  . Neck surgery      lift    Family History  Problem Relation Age of Onset  . Ovarian cancer Mother   . Colon cancer Mother    History   Social History  . Marital Status: Married    Spouse Name: N/A    Number of Children: 1  . Years of Education: 121   Occupational History  .     Social History Main Topics  . Smoking status: Never Smoker   . Smokeless tobacco: Never Used  . Alcohol Use: Yes     Comment: rare  . Drug Use: No  . Sexually Active: Yes -- Female partner(s)   Other Topics Concern  . Not on file   Social History Narrative   HSG. Married - '77. great niece lives with her. 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 no history of physical or sexual abuse.     No current outpatient prescriptions on file prior to visit.   No current facility-administered medications on file prior to visit.      Review of Systems Constitutional:  Negative for fever, chills, activity change. She has had a 6 lb weight loss in the last year and 30  lbs since '09  HEENT:  Negative for hearing loss, ear pain, congestion, neck stiffness and postnasal drip. Negative for sore throat or swallowing problems. Negative for dental complaints.   Eyes: Negative for vision loss or change in visual acuity.  Respiratory: Negative for chest tightness and wheezing. Negative for DOE.   Cardiovascular: Negative for chest pain or palpitations. No decreased exercise tolerance Gastrointestinal: No change in bowel habit. No bloating or gas. No reflux or indigestion Genitourinary: Negative for urgency, frequency, flank pain and difficulty urinating.  Musculoskeletal: Negative for myalgias, back pain, arthralgias and gait problem.  Neurological: Negative for dizziness, tremors, weakness and headaches.  Hematological: Negative for adenopathy.  Psychiatric/Behavioral: Negative for behavioral problems and dysphoric mood.        Objective:   Physical Exam Filed Vitals:   12/27/12 1122  BP: 118/86  Pulse: 78  Temp: 96.8 F (36 C)   Wt Readings from Last 3 Encounters:  12/27/12 102 lb (46.267 kg)  06/02/12 108 lb (48.988 kg)  05/27/12 108 lb (48.988 kg)   Gen'l: well nourished, very slender but well developed white Woman in no distress HEENT - [place]/AT, EACs/TMs normal, oropharynx with native  dentition in good condition, no buccal or palatal lesions, posterior pharynx clear, mucous membranes moist. C&S clear, PERRLA, fundi - normal Neck - supple, no thyromegaly Nodes- negative submental, cervical, supraclavicular regions Chest - no deformity, no CVAT Lungs - clear without rales, wheezes. No increased work of breathing Breast - deferred Cardiovascular - regular rate and rhythm, quiet precordium, no murmurs, rubs or gallops, 2+ radial, DP and PT pulses Abdomen - BS+ x 4, no HSM, no guarding or rebound or tenderness Pelvic - deferred to gyn Rectal - deferred to GI with colonoscopy in Dec '13.  Extremities - no clubbing, cyanosis, edema or deformity.  Neuro  - A&O x 3, CN II-XII normal, motor strength normal and equal, DTRs 2+ and symmetrical biceps, radial, and patellar tendons. Cerebellar - no tremor, no rigidity, fluid movement and normal gait. Derm - Head, neck, back, abdomen and extremities without suspicious lesions        Assessment & Plan:  Pre operative clearance - physical exam is normal except for being slender. Will send for routine lab work today. Barring any unforseen abnormalities she is cleared for surgery and anesthesia.

## 2012-12-27 NOTE — Patient Instructions (Addendum)
1. Continued weight loss despite eating a normal diet. She has been cleared for colorectal cancer and does have annual mammography at [facility]. She has a 1st degree relative with ovarian cancer but she herself is BRAC gene negative. It has been several years since she had a transvaginal u/s. She did have normal thyroid function in Nov '13.  Plan Discuss with gyn need for repeat ovarian evaluation.  Food diary and calorie count as a starting point in evaluation.  2. Surgical clearance - normal EKG in '11. No cardiac risk factors thus no need for repeat EKG  Plan  routine lab today.  Barring unforseen lab abnormality she is cleared for anesthesia and surgery.

## 2012-12-27 NOTE — Assessment & Plan Note (Signed)
3 mm right thyroid nodule Nov '13.  Plan - repeat U/S in Nov '14

## 2012-12-27 NOTE — Assessment & Plan Note (Signed)
Continued weight loss despite eating a normal diet. She has been cleared for colorectal cancer and does have annual mammography at [facility]. She has a 1st degree relative with ovarian cancer but she herself is BRAC gene negative. It has been several years since she had a transvaginal u/s. She did have normal thyroid function in Nov '13.  Plan Discuss with gyn need for repeat ovarian evaluation.  Food diary and calorie count as a starting point in evaluation.

## 2012-12-30 ENCOUNTER — Telehealth: Payer: Self-pay

## 2012-12-30 LAB — MRSA CULTURE

## 2012-12-30 NOTE — Telephone Encounter (Signed)
Patient's labs have been faxed to Dr Tonye Becket at [phone] for an upcoming procedure on 01/11/13. Phone # [phone].

## 2012-12-31 ENCOUNTER — Encounter: Payer: Self-pay | Admitting: Internal Medicine

## 2013-03-08 ENCOUNTER — Telehealth: Payer: Self-pay | Admitting: Internal Medicine

## 2013-03-08 NOTE — Telephone Encounter (Signed)
Rec'd from [facility] forward 4 pages to Dr. Norins °

## 2013-03-10 ENCOUNTER — Telehealth: Payer: Self-pay | Admitting: Internal Medicine

## 2013-03-10 NOTE — Telephone Encounter (Signed)
Rec'd from [facility] forward 3 pages to Dr.Norins °

## 2013-03-14 ENCOUNTER — Encounter: Payer: Self-pay | Admitting: Internal Medicine

## 2013-03-29 ENCOUNTER — Encounter: Payer: Self-pay | Admitting: Internal Medicine

## 2013-03-29 ENCOUNTER — Ambulatory Visit (INDEPENDENT_AMBULATORY_CARE_PROVIDER_SITE_OTHER): Payer: BC Managed Care – PPO | Admitting: Internal Medicine

## 2013-03-29 VITALS — BP 100/70 | HR 91 | Temp 98.2°F | Wt 104.0 lb

## 2013-03-29 DIAGNOSIS — L988 Other specified disorders of the skin and subcutaneous tissue: Secondary | ICD-10-CM

## 2013-03-29 DIAGNOSIS — R234 Changes in skin texture: Secondary | ICD-10-CM

## 2013-03-29 MED ORDER — MUPIROCIN CALCIUM 2 % NA OINT: TOPICAL_OINTMENT | Freq: Two times a day (BID) | NASAL | Status: DC

## 2013-03-29 NOTE — Patient Instructions (Signed)
Dehiscence of old repair left nostril - small fissure. No apparent infection.  Plan bactroban ointment applied to nostril twice a day to be sure there is no infection  If the fissure doesn't close in another week or so you will need to consider a plastics consultation.

## 2013-03-30 NOTE — Progress Notes (Signed)
  Subjective:    Patient ID: Cheryl Lawson, female    DOB: 1951/01/30, 62 y.o.   MRN: [id number]  HPI Cheryl Lawson presents for a fissure/dehiscience of nasal repair at the anterior septum. She reports that when she opens her mouth the wound is worse - opens more. She has had several plastic surgical interventions - most recently a reconstruction of the nose by a Careers adviser in [place], [place]. The left nostril area was the work of a Engineer, petroleum in [place], [place]. She has had no fever, no purulent drainage, no pain.  History reviewed. No pertinent past medical history. Past Surgical History  Procedure Laterality Date  . Nasal septum surgery    . Infertility surgery    . Tonsillectomy    . Breast lumpectomy      right  . Neck surgery      lift    Family History  Problem Relation Age of Onset  . Ovarian cancer Mother   . Colon cancer Mother    History   Social History  . Marital Status: Married    Spouse Name: N/A    Number of Children: 1  . Years of Education: 121   Occupational History  .     Social History Main Topics  . Smoking status: Never Smoker   . Smokeless tobacco: Never Used  . Alcohol Use: Yes     Comment: rare  . Drug Use: No  . Sexual Activity: Yes    Partners: Male   Other Topics Concern  . Not on file   Social History Narrative   HSG. Married - '77. great niece lives with her. 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 no history of physical or sexual abuse.     No current outpatient prescriptions on file prior to visit.   No current facility-administered medications on file prior to visit.      Review of Systems System review is negative for any constitutional, cardiac, pulmonary, GI or neuro symptoms or complaints other than as described in the HPI.     Objective:   Physical Exam Filed Vitals:   03/29/13 1632  BP: 100/70  Pulse: 91  Temp: 98.2 F (36.8 C)   Gen'l - WNWD woman in no  distress HEENT- left nostril normal externally. At the medial/anterior septum there is a small linear fissure at previous surgical site. There is no purulence       Assessment & Plan:  Nasal fissure - no signs of active infection.  Plan bactroban nasal 2% ointment - apply bid  May need plastic surgical consult for repair.

## 2013-04-21 ENCOUNTER — Other Ambulatory Visit: Payer: Self-pay

## 2013-05-23 ENCOUNTER — Telehealth: Payer: Self-pay | Admitting: Internal Medicine

## 2013-05-23 NOTE — Telephone Encounter (Signed)
Rec'd from [facility] forward 3 pages to Dr.Norins °

## 2013-05-24 ENCOUNTER — Encounter (HOSPITAL_BASED_OUTPATIENT_CLINIC_OR_DEPARTMENT_OTHER): Payer: Self-pay | Admitting: Emergency Medicine

## 2013-05-24 ENCOUNTER — Emergency Department (HOSPITAL_BASED_OUTPATIENT_CLINIC_OR_DEPARTMENT_OTHER)
Admission: EM | Admit: 2013-05-24 | Discharge: 2013-05-24 | Disposition: A | Discharge status: Home / Self Care | Payer: BC Managed Care – PPO | Attending: Emergency Medicine | Admitting: Emergency Medicine

## 2013-05-24 DIAGNOSIS — Z792 Long term (current) use of antibiotics: Secondary | ICD-10-CM | POA: Insufficient documentation

## 2013-05-24 DIAGNOSIS — Z853 Personal history of malignant neoplasm of breast: Secondary | ICD-10-CM | POA: Insufficient documentation

## 2013-05-24 DIAGNOSIS — N949 Unspecified condition associated with female genital organs and menstrual cycle: Secondary | ICD-10-CM

## 2013-05-24 DIAGNOSIS — N898 Other specified noninflammatory disorders of vagina: Secondary | ICD-10-CM | POA: Insufficient documentation

## 2013-05-24 DIAGNOSIS — N9489 Other specified conditions associated with female genital organs and menstrual cycle: Secondary | ICD-10-CM | POA: Insufficient documentation

## 2013-05-24 HISTORY — DX: Malignant neoplasm of unspecified site of unspecified female breast: C50.919

## 2013-05-24 LAB — URINALYSIS, ROUTINE W REFLEX MICROSCOPIC
Glucose, UA: NEGATIVE mg/dL
Hgb urine dipstick: NEGATIVE
Leukocytes, UA: NEGATIVE
Specific Gravity, Urine: 1.005 (ref 1.005–1.030)
pH: 6.5 (ref 5.0–8.0)

## 2013-05-24 LAB — WET PREP, GENITAL
Clue Cells Wet Prep HPF POC: NONE SEEN
Trich, Wet Prep: NONE SEEN
Yeast Wet Prep HPF POC: NONE SEEN

## 2013-05-24 LAB — GC/CHLAMYDIA PROBE AMP: CT Probe RNA: NEGATIVE

## 2013-05-24 MED ORDER — NAPROXEN 250 MG PO TABS
ORAL_TABLET | ORAL | Status: AC
  Filled 2013-05-24: qty 2

## 2013-05-24 MED ORDER — NAPROXEN 375 MG PO TABS: 375.0000 mg | ORAL_TABLET | Freq: Two times a day (BID) | ORAL | Status: DC

## 2013-05-24 MED ORDER — NAPROXEN 250 MG PO TABS
500.0000 mg | ORAL_TABLET | Freq: Once | ORAL | Status: AC
  Administered 2013-05-24: 500 mg via ORAL

## 2013-05-24 NOTE — ED Notes (Signed)
Vaginal burning one week ago which she treated with monistat, sxs not improving so was seen at clinic and treated for UTI.

## 2013-05-24 NOTE — ED Provider Notes (Signed)
CSN: [id number]     Arrival date & time 05/24/13  0259 History   First MD Initiated Contact with Patient 05/24/13 [phone]     Chief Complaint  Patient presents with  . Vaginal Itching   (Consider location/radiation/quality/duration/timing/severity/associated sxs/prior Treatment) Patient is a 62 y.o. female presenting with vaginal itching. The history is provided by the patient.  Vaginal Itching This is a new problem. The current episode started more than 1 week ago. The problem occurs constantly. The problem has not changed since onset.Pertinent negatives include no chest pain. Nothing aggravates the symptoms. Nothing relieves the symptoms. Treatments tried: cipro for 3 days and monistat. The treatment provided no relief.  Also describes burning inside the vaginal vault.    Past Medical History  Diagnosis Date  . Breast cancer    Past Surgical History  Procedure Laterality Date  . Nasal septum surgery    . Infertility surgery    . Tonsillectomy    . Breast lumpectomy      right  . Neck surgery      lift    Family History  Problem Relation Age of Onset  . Ovarian cancer Mother   . Colon cancer Mother    History  Substance Use Topics  . Smoking status: Never Smoker   . Smokeless tobacco: Never Used  . Alcohol Use: Yes     Comment: rare   OB History   Grav Para Term Preterm Abortions TAB SAB Ect Mult Living                 Review of Systems  Cardiovascular: Negative for chest pain.  Genitourinary: Positive for vaginal discharge. Negative for flank pain.  All other systems reviewed and are negative.    Allergies  Review of patient's allergies indicates no known allergies.  Home Medications   Current Outpatient Rx  Name  Route  Sig  Dispense  Refill  . ciprofloxacin (CIPRO) 500 MG tablet   Oral   Take 500 mg by mouth 2 (two) times daily.         . mupirocin nasal ointment (BACTROBAN) 2 %   Nasal   Place into the nose 2 (two) times daily. Use a small amoung on  a q-tip in each nostril twice daily for five (5) days. After application, press sides of nose together and gently massage.   10 g   0    BP 125/93  Pulse 76  Temp(Src) 98.3 F (36.8 C) (Oral)  Resp 18  Ht 5\' 5"  (1.651 m)  Wt 101 lb (45.813 kg)  BMI 16.81 kg/m2  SpO2 100% Physical Exam  Constitutional: She is oriented to person, place, and time. She appears well-developed and well-nourished. No distress.  HENT:  Head: Normocephalic and atraumatic.  Mouth/Throat: Oropharynx is clear and moist.  Eyes: Conjunctivae are normal. Pupils are equal, round, and reactive to light.  Neck: Normal range of motion. Neck supple.  Cardiovascular: Normal rate and regular rhythm.   Pulmonary/Chest: Effort normal and breath sounds normal. She has no wheezes. She has no rales.  Abdominal: Soft. Bowel sounds are normal. There is no tenderness.  Genitourinary: There is no rash, lesion or injury on the right labia. There is no rash, lesion or injury on the left labia. Cervix exhibits discharge. Right adnexum displays no mass, no tenderness and no fullness. Left adnexum displays no mass, no tenderness and no fullness. No erythema, tenderness or bleeding around the vagina. No signs of injury around the vagina. Vaginal  discharge found.  Yellow discharge, scant.  Mild tenderness on displacement of the cervix.  Female chaperone present for exam  Musculoskeletal: Normal range of motion.  Neurological: She is alert and oriented to person, place, and time.  Skin: Skin is warm and dry.  Psychiatric: She has a normal mood and affect.    ED Course  Procedures (including critical care time) Labs Review Labs Reviewed  WET PREP, GENITAL  GC/CHLAMYDIA PROBE AMP  URINALYSIS, ROUTINE W REFLEX MICROSCOPIC   Imaging Review No results found.  EKG Interpretation   None      DDx: 1. Pelvic infection  2. Vulvodynia 3. Vulvovaginitis  MDM  No diagnosis found. Discussed all results with patient at length,  patient's nurse Mosetta Putt present.  No UTI on urine sample.  No yeast on wet prep, sample was adequate. Discharge was not consistent with yeast nor bacterial vaginosis.  Patient states that she does not believe that she has pelvic infection given that she is married and would like to wait for tests for GC and chlamydia to return in 72 hours.  This is reasonable.  Will trial NSAID for pain. Follow up with your regular gynecologist for ongoing care.  Will not trial corticosteroids until infection is excluded.   Patient states she will call Dr. Tenny Craw to follow up for ongoing care and testing.      Jaquavius Hudler Smitty Cords, MD 05/24/13 0400

## 2013-07-06 ENCOUNTER — Encounter: Payer: Self-pay | Admitting: Internal Medicine

## 2013-07-06 ENCOUNTER — Ambulatory Visit (INDEPENDENT_AMBULATORY_CARE_PROVIDER_SITE_OTHER): Payer: BC Managed Care – PPO | Admitting: Internal Medicine

## 2013-07-06 VITALS — BP 118/88 | HR 102 | Temp 97.3°F | Wt 100.0 lb

## 2013-07-06 DIAGNOSIS — F3289 Other specified depressive episodes: Secondary | ICD-10-CM

## 2013-07-06 DIAGNOSIS — R45851 Suicidal ideations: Secondary | ICD-10-CM

## 2013-07-06 DIAGNOSIS — F329 Major depressive disorder, single episode, unspecified: Secondary | ICD-10-CM

## 2013-07-06 MED ORDER — DIAZEPAM 5 MG PO TABS: 5.0000 mg | ORAL_TABLET | Freq: Three times a day (TID) | ORAL | Status: DC

## 2013-07-06 MED ORDER — LEXAPRO 10 MG PO TABS: 10.0000 mg | ORAL_TABLET | Freq: Every day | ORAL | Status: DC

## 2013-07-06 MED ORDER — TRAZODONE HCL 50 MG PO TABS: 50.0000 mg | ORAL_TABLET | Freq: Every day | ORAL | Status: DC

## 2013-07-06 NOTE — Progress Notes (Signed)
   Subjective:    Patient ID: Cheryl Lawson, female    DOB: July 03, 1950, 63 y.o.   MRN: [id number]  HPI Mrs. Orchard presents for acute depression and dispair. She does have SI without a firm plan. The origin of her emotional state is complications of plastic surgery - laser ablation of spider veins that has lead to a change in her lip contours. She has not been able to sleep. She perseverates over the incident and feels helpless. At exam she is very tearful and distraught.  Past Medical History  Diagnosis Date  . Breast cancer    Past Surgical History  Procedure Laterality Date  . Nasal septum surgery    . Infertility surgery    . Tonsillectomy    . Breast lumpectomy      right  . Neck surgery      lift    Family History  Problem Relation Age of Onset  . Ovarian cancer Mother   . Colon cancer Mother    History   Social History  . Marital Status: Married    Spouse Name: N/A    Number of Children: 1  . Years of Education: 121   Occupational History  .     Social History Main Topics  . Smoking status: Never Smoker   . Smokeless tobacco: Never Used  . Alcohol Use: Yes     Comment: rare  . Drug Use: No  . Sexual Activity: Yes    Partners: Male   Other Topics Concern  . Not on file   Social History Narrative   HSG. Married - '77. great niece lives with her. 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 no history of physical or sexual abuse.     Current Outpatient Prescriptions on File Prior to Visit  Medication Sig Dispense Refill  . ciprofloxacin (CIPRO) 500 MG tablet Take 500 mg by mouth 2 (two) times daily.      . mupirocin nasal ointment (BACTROBAN) 2 % Place into the nose 2 (two) times daily. Use a small amoung on a q-tip in each nostril twice daily for five (5) days. After application, press sides of nose together and gently massage.  10 g  0  . naproxen (NAPROSYN) 375 MG tablet Take 1 tablet (375 mg total) by mouth 2 (two) times daily.   20 tablet  0   No current facility-administered medications on file prior to visit.       Review of Systems System review is negative for any constitutional, cardiac, pulmonary, GI or neuro symptoms or complaints other than as described in the HPI.     Objective:   Physical Exam Filed Vitals:   07/06/13 1617  BP: 118/88  Pulse: 102  Temp: 97.3 F (36.3 C)   Gen'l - very thin woman who is emotionally upset, tearful Psych - agitated, tearful, a bit desperate in speech and ideation, has SI ideation but not concrete plan.       Assessment & Plan:

## 2013-07-06 NOTE — Progress Notes (Signed)
Pre visit review using our clinic review tool, if applicable. No additional management support is needed unless otherwise documented below in the visit note. 

## 2013-07-06 NOTE — Patient Instructions (Signed)
Acute depression and anxiety with sleeplessness and agitation. Plan Will help get you an appointment at [facility] 10 mg once a day - an SSRI antidepressant  Diazepam 5 mg 3 times a day for acute anxiety as well as muscle twitching/spasm for 7-10 days while lexapro takes affect  Trazodone 50 mg at bedtime to assist with sleep as well as an antidepressant.  If you have true suicidal thoughts/action plans your promise to call me [phone].

## 2013-07-07 DIAGNOSIS — F418 Other specified anxiety disorders: Secondary | ICD-10-CM | POA: Insufficient documentation

## 2013-07-07 NOTE — Assessment & Plan Note (Signed)
Acute depression and anxiety with sleeplessness and agitation. Plan Will help get you an appointment at  [facility]  Lexapro 10 mg once a day - an SSRI antidepressant  Diazepam 5 mg 3 times a day for acute anxiety as well as muscle twitching/spasm for 7-10 days while lexapro takes affect  Trazodone 50 mg at bedtime to assist with sleep as well as an antidepressant.  If you have true suicidal thoughts/action plans your promise to call me [phone].    

## 2013-07-08 ENCOUNTER — Encounter: Payer: Self-pay | Admitting: Internal Medicine

## 2013-07-15 ENCOUNTER — Encounter: Payer: Self-pay | Admitting: Internal Medicine

## 2013-07-18 ENCOUNTER — Ambulatory Visit (INDEPENDENT_AMBULATORY_CARE_PROVIDER_SITE_OTHER): Payer: BC Managed Care – PPO | Admitting: Licensed Clinical Social Worker

## 2013-07-18 DIAGNOSIS — F331 Major depressive disorder, recurrent, moderate: Secondary | ICD-10-CM

## 2013-07-22 ENCOUNTER — Encounter: Payer: Self-pay | Admitting: Internal Medicine

## 2013-08-08 ENCOUNTER — Encounter: Payer: Self-pay | Admitting: Internal Medicine

## 2013-08-08 MED ORDER — DULOXETINE HCL 30 MG PO CPEP: 30.0000 mg | ORAL_CAPSULE | Freq: Every day | ORAL | Status: DC

## 2013-08-29 ENCOUNTER — Ambulatory Visit: Payer: BC Managed Care – PPO | Admitting: Licensed Clinical Social Worker

## 2013-09-04 ENCOUNTER — Other Ambulatory Visit: Payer: Self-pay | Admitting: Internal Medicine

## 2013-09-07 ENCOUNTER — Encounter: Payer: Self-pay | Admitting: Internal Medicine

## 2013-09-07 ENCOUNTER — Other Ambulatory Visit: Payer: Self-pay | Admitting: Internal Medicine

## 2013-09-07 MED ORDER — ALPRAZOLAM ER 1 MG PO TB24: 1.0000 mg | ORAL_TABLET | Freq: Every day | ORAL | Status: DC

## 2013-09-23 ENCOUNTER — Ambulatory Visit (INDEPENDENT_AMBULATORY_CARE_PROVIDER_SITE_OTHER): Payer: BC Managed Care – PPO | Admitting: Licensed Clinical Social Worker

## 2013-09-23 DIAGNOSIS — F331 Major depressive disorder, recurrent, moderate: Secondary | ICD-10-CM

## 2013-10-03 ENCOUNTER — Ambulatory Visit (INDEPENDENT_AMBULATORY_CARE_PROVIDER_SITE_OTHER): Payer: BC Managed Care – PPO | Admitting: Licensed Clinical Social Worker

## 2013-10-03 DIAGNOSIS — F331 Major depressive disorder, recurrent, moderate: Secondary | ICD-10-CM

## 2013-10-14 ENCOUNTER — Encounter: Payer: Self-pay | Admitting: Family Medicine

## 2013-10-14 ENCOUNTER — Ambulatory Visit (INDEPENDENT_AMBULATORY_CARE_PROVIDER_SITE_OTHER): Payer: BC Managed Care – PPO | Admitting: Family Medicine

## 2013-10-14 VITALS — BP 104/60 | HR 80 | Temp 97.3°F | Ht 65.5 in | Wt 104.6 lb

## 2013-10-14 DIAGNOSIS — F419 Anxiety disorder, unspecified: Secondary | ICD-10-CM

## 2013-10-14 DIAGNOSIS — F411 Generalized anxiety disorder: Secondary | ICD-10-CM

## 2013-10-14 LAB — CBC WITH DIFFERENTIAL/PLATELET
BASOS ABS: 0 10*3/uL (ref 0.0–0.1)
BASOS PCT: 0.9 % (ref 0.0–3.0)
Eosinophils Absolute: 0.2 10*3/uL (ref 0.0–0.7)
Eosinophils Relative: 3.9 % (ref 0.0–5.0)
HCT: 43.8 % (ref 36.0–46.0)
Hemoglobin: 14.8 g/dL (ref 12.0–15.0)
Lymphocytes Relative: 22.1 % (ref 12.0–46.0)
Lymphs Abs: 0.9 10*3/uL (ref 0.7–4.0)
MCHC: 33.7 g/dL (ref 30.0–36.0)
MCV: 92.8 fl (ref 78.0–100.0)
MONOS PCT: 7.1 % (ref 3.0–12.0)
Monocytes Absolute: 0.3 10*3/uL (ref 0.1–1.0)
NEUTROS ABS: 2.6 10*3/uL (ref 1.4–7.7)
Neutrophils Relative %: 66 % (ref 43.0–77.0)
Platelets: 187 10*3/uL (ref 150.0–400.0)
RBC: 4.72 Mil/uL (ref 3.87–5.11)
RDW: 13.6 % (ref 11.5–14.6)
WBC: 4 10*3/uL — ABNORMAL LOW (ref 4.5–10.5)

## 2013-10-14 LAB — T4, FREE: Free T4: 0.81 ng/dL (ref 0.60–1.60)

## 2013-10-14 LAB — HEPATIC FUNCTION PANEL
ALK PHOS: 52 U/L (ref 39–117)
ALT: 18 U/L (ref 0–35)
AST: 31 U/L (ref 0–37)
Albumin: 4.2 g/dL (ref 3.5–5.2)
BILIRUBIN DIRECT: 0 mg/dL (ref 0.0–0.3)
Total Bilirubin: 0.7 mg/dL (ref 0.3–1.2)
Total Protein: 6.3 g/dL (ref 6.0–8.3)

## 2013-10-14 LAB — BASIC METABOLIC PANEL
BUN: 14 mg/dL (ref 6–23)
CO2: 30 meq/L (ref 19–32)
CREATININE: 0.8 mg/dL (ref 0.4–1.2)
Calcium: 9.6 mg/dL (ref 8.4–10.5)
Chloride: 102 mEq/L (ref 96–112)
GFR: 73.39 mL/min (ref >60.00)
Glucose, Bld: 64 mg/dL — ABNORMAL LOW (ref 70–99)
Potassium: 3.8 mEq/L (ref 3.5–5.1)
Sodium: 139 mEq/L (ref 135–145)

## 2013-10-14 LAB — T3, FREE: T3, Free: 3 pg/mL (ref 2.3–4.2)

## 2013-10-14 LAB — TSH: TSH: 1.13 u[IU]/mL (ref 0.35–5.50)

## 2013-10-14 MED ORDER — ESCITALOPRAM OXALATE 20 MG PO TABS: 20.0000 mg | ORAL_TABLET | Freq: Every day | ORAL | Status: DC

## 2013-10-14 MED ORDER — ALPRAZOLAM 0.25 MG PO TABS: 0.2500 mg | ORAL_TABLET | Freq: Three times a day (TID) | ORAL | Status: DC | PRN

## 2013-10-14 NOTE — Patient Instructions (Signed)

## 2013-10-14 NOTE — Progress Notes (Signed)
Pre visit review using our clinic review tool, if applicable. No additional management support is needed unless otherwise documented below in the visit note. 

## 2013-10-14 NOTE — Progress Notes (Signed)
  Subjective:     Cheryl Lawson is a 63 y.o. female who presents for follow up of anxiety disorder and panic attacks. She has the following anxiety symptoms: fatigue, feelings of losing control, insomnia, irritable, palpitations and panic attacks. Onset of symptoms was approximately several months ago. Symptoms have been unchanged since that time. She denies current suicidal and homicidal ideation. Family history significant for anxiety. Risk factors: positive family history in  sister(s) and negative life event pt would not go into details. Previous treatment includes Valium and xanax xr. She complains of the following medication side effects: drowsiness. The following portions of the patient's history were reviewed and updated as appropriate: allergies, current medications, past family history, past medical history, past social history, past surgical history and problem list.  Review of Systems Pertinent items are noted in HPI.    Objective:    BP 104/60  Pulse 80  Temp(Src) 97.3 F (36.3 C) (Oral)  Ht 5' 5.5" (1.664 m)  Wt 104 lb 9.6 oz (47.446 kg)  BMI 17.14 kg/m2  SpO2 98% General appearance: alert, cooperative, appears stated age and no distress Neck: no adenopathy, supple, symmetrical, trachea midline and thyroid not enlarged, symmetric, no tenderness/mass/nodules Neurologic: Alert and oriented X 3, normal strength and tone. Normal symmetric reflexes. Normal coordination and gait    Assessment:    anxiety disorder and panic attacks. Possible organic contributing causes are: endocrine/metabolic.   Plan:    Medications: Lexapro and Xanax. Labs: Comprehensive metabolic profile and TSH. Continue with counseling. Handouts describing disease, natural history, and treatment were given to the patient. Instructed patient to contact office or on-call physician promptly should condition worsen or any new symptoms appear and provided on-call telephone numbers. IF THE PATIENT HAS ANY  SUICIDAL OR HOMICIDAL IDEATIONS, CALL THE OFFICE, DISCUSS WITH A SUPPORT MEMBER, OR GO TO THE ER IMMEDIATELY. Patient was agreeable with this plan. Follow up: 1 month. --- or sooner prn

## 2013-10-17 LAB — THYROID PEROXIDASE ANTIBODY: Thyroperoxidase Ab SerPl-aCnc: 482 IU/mL — ABNORMAL HIGH (ref <35.0)

## 2013-10-18 ENCOUNTER — Telehealth: Payer: Self-pay | Admitting: Family Medicine

## 2013-10-18 NOTE — Telephone Encounter (Signed)
Caller name: Taneah  Relation to FI:EPPI Call back number:[phone]   Reason for call:  Pt has questions about RX escitalopram (LEXAPRO) 20 MG tablet and how to be able to stop taking this medication.  Pt wants to be tapered off and needs advice.

## 2013-10-18 NOTE — Telephone Encounter (Signed)
lexapro 10 mg #30  1 po qd ---- take for 2-3 weeks then can cut in 1/2 and take 1/2 daily until done

## 2013-10-18 NOTE — Telephone Encounter (Signed)
Spoke with patient who states due to the side effects of the Lexapro she wants to wean off the medication. She has been experiencing a burning sensation in her arms, tremors, insomnia and feeling jittery. She states she will take the Xanax when she has a panic attack but does not want to be on another antidepressant. Please advise on tapering. Patient stated she will be establishing with Dr.Lowne

## 2013-10-18 NOTE — Telephone Encounter (Addendum)
Spoke with patient and made aware of Dr. Nonda Lou recommendations on weaning off Lexapro. Patient verbalized understanding with plan.

## 2013-10-21 ENCOUNTER — Encounter (HOSPITAL_BASED_OUTPATIENT_CLINIC_OR_DEPARTMENT_OTHER): Payer: Self-pay | Admitting: Emergency Medicine

## 2013-10-21 ENCOUNTER — Emergency Department (HOSPITAL_BASED_OUTPATIENT_CLINIC_OR_DEPARTMENT_OTHER)
Admission: EM | Admit: 2013-10-21 | Discharge: 2013-10-21 | Disposition: A | Discharge status: Home / Self Care | Payer: BC Managed Care – PPO | Attending: Emergency Medicine | Admitting: Emergency Medicine

## 2013-10-21 ENCOUNTER — Other Ambulatory Visit: Payer: Self-pay

## 2013-10-21 ENCOUNTER — Telehealth: Payer: Self-pay | Admitting: Internal Medicine

## 2013-10-21 DIAGNOSIS — F329 Major depressive disorder, single episode, unspecified: Secondary | ICD-10-CM | POA: Insufficient documentation

## 2013-10-21 DIAGNOSIS — R350 Frequency of micturition: Secondary | ICD-10-CM | POA: Insufficient documentation

## 2013-10-21 DIAGNOSIS — Z792 Long term (current) use of antibiotics: Secondary | ICD-10-CM | POA: Insufficient documentation

## 2013-10-21 DIAGNOSIS — R768 Other specified abnormal immunological findings in serum: Secondary | ICD-10-CM

## 2013-10-21 DIAGNOSIS — F411 Generalized anxiety disorder: Secondary | ICD-10-CM | POA: Insufficient documentation

## 2013-10-21 DIAGNOSIS — H9209 Otalgia, unspecified ear: Secondary | ICD-10-CM | POA: Insufficient documentation

## 2013-10-21 DIAGNOSIS — Z853 Personal history of malignant neoplasm of breast: Secondary | ICD-10-CM | POA: Insufficient documentation

## 2013-10-21 DIAGNOSIS — Z87440 Personal history of urinary (tract) infections: Secondary | ICD-10-CM | POA: Insufficient documentation

## 2013-10-21 DIAGNOSIS — Z79899 Other long term (current) drug therapy: Secondary | ICD-10-CM | POA: Insufficient documentation

## 2013-10-21 DIAGNOSIS — F3289 Other specified depressive episodes: Secondary | ICD-10-CM | POA: Insufficient documentation

## 2013-10-21 DIAGNOSIS — E041 Nontoxic single thyroid nodule: Secondary | ICD-10-CM

## 2013-10-21 DIAGNOSIS — R300 Dysuria: Secondary | ICD-10-CM

## 2013-10-21 HISTORY — DX: Depression, unspecified: F32.A

## 2013-10-21 HISTORY — DX: Major depressive disorder, single episode, unspecified: F32.9

## 2013-10-21 HISTORY — DX: Anxiety disorder, unspecified: F41.9

## 2013-10-21 LAB — URINALYSIS, ROUTINE W REFLEX MICROSCOPIC
BILIRUBIN URINE: NEGATIVE
GLUCOSE, UA: NEGATIVE mg/dL
HGB URINE DIPSTICK: NEGATIVE
Ketones, ur: NEGATIVE mg/dL
Leukocytes, UA: NEGATIVE
Nitrite: NEGATIVE
Protein, ur: NEGATIVE mg/dL
SPECIFIC GRAVITY, URINE: 1.006 (ref 1.005–1.030)
UROBILINOGEN UA: 0.2 mg/dL (ref 0.0–1.0)
pH: 7 (ref 5.0–8.0)

## 2013-10-21 MED ORDER — PHENAZOPYRIDINE HCL 200 MG PO TABS: 200.0000 mg | ORAL_TABLET | Freq: Three times a day (TID) | ORAL | Status: DC

## 2013-10-21 NOTE — Telephone Encounter (Signed)
Notes Recorded by Rosalita Chessman, DO on 10/19/2013 at 10:53 PM With hx thyroid nodule and positive TBO---- refer to endo   Discussed with the patient and she voiced understanding. Her appointment has been scheduled     KP

## 2013-10-21 NOTE — Discharge Instructions (Signed)

## 2013-10-21 NOTE — Telephone Encounter (Signed)
Caller name:Leocadia Dorame Relation to NF:[id number] Call back number:[phone] Pharmacy:  Reason for call: Patient called to obtain info on why she was referred to Endo. Also patient called to see what did her lab work show ?

## 2013-10-21 NOTE — ED Provider Notes (Signed)
CSN: [id number]     Arrival date & time 10/21/13  0124 History   First MD Initiated Contact with Patient 10/21/13 0133     Chief Complaint  Patient presents with  . Dysuria     (Consider location/radiation/quality/duration/timing/severity/associated sxs/prior Treatment) HPI  This is a 63 year old female who presents with urinary frequency and dysuria. Onset of symptoms yesterday. Patient reported that she had urinary frequency yesterday and woke up tonight with burning during urination. She has a history of UTIs associated with sexual activity with her husband. She denies any fevers. Patient states that she's currently taking Bactrim and has taken a total of 3 doses so far for an unrelated sinus issue. Patient denies any nausea, vomiting, flank pain, abdominal pain.  Patient also states that she feels she may be getting a right ear infection. She reports otalgia of that ear.  Past Medical History  Diagnosis Date  . Breast cancer   . Depression   . Anxiety    Past Surgical History  Procedure Laterality Date  . Nasal septum surgery    . Infertility surgery    . Tonsillectomy    . Breast lumpectomy      right  . Neck surgery      lift    Family History  Problem Relation Age of Onset  . Ovarian cancer Mother   . Colon cancer Mother    History  Substance Use Topics  . Smoking status: Never Smoker   . Smokeless tobacco: Never Used  . Alcohol Use: Yes     Comment: rare   OB History   Grav Para Term Preterm Abortions TAB SAB Ect Mult Living                 Review of Systems  Constitutional: Negative for fever.  Respiratory: Negative for chest tightness and shortness of breath.   Cardiovascular: Negative for chest pain.  Gastrointestinal: Negative for nausea, vomiting and abdominal pain.  Genitourinary: Positive for dysuria and frequency. Negative for flank pain.  Neurological: Negative for headaches.  All other systems reviewed and are negative.     Allergies   Review of patient's allergies indicates no known allergies.  Home Medications   Prior to Admission medications   Medication Sig Start Date End Date Taking? Authorizing Provider  ALPRAZolam (XANAX) 0.25 MG tablet Take 1 tablet (0.25 mg total) by mouth 3 (three) times daily as needed for anxiety. 10/14/13  Yes Yvonne R Lowne, DO  escitalopram (LEXAPRO) 20 MG tablet Take 1 tablet (20 mg total) by mouth daily. 10/14/13  Yes Yvonne R Lowne, DO  sulfamethoxazole-trimethoprim (BACTRIM,SEPTRA) 400-80 MG per tablet Take 1 tablet by mouth 2 (two) times daily.   Yes Historical Provider, MD  phenazopyridine (PYRIDIUM) 200 MG tablet Take 1 tablet (200 mg total) by mouth 3 (three) times daily. 10/21/13   Merryl Hacker, MD   BP 124/90  Pulse 78  Temp(Src) 98.2 F (36.8 C) (Oral)  Resp 16  Ht 5\' 5"  (1.651 m)  Wt 100 lb (45.36 kg)  BMI 16.64 kg/m2  SpO2 100% Physical Exam  Nursing note and vitals reviewed. Constitutional: She is oriented to person, place, and time. No distress.  Anxious appearing  HENT:  Head: Normocephalic and atraumatic.  Bilateral TMs normal  Cardiovascular: Normal rate, regular rhythm and normal heart sounds.   Pulmonary/Chest: Effort normal and breath sounds normal. No respiratory distress. She has no wheezes.  Abdominal: Soft. There is no tenderness.  Neurological: She is alert and  oriented to person, place, and time.  Skin: Skin is warm and dry.  Psychiatric: She has a normal mood and affect.    ED Course  Procedures (including critical care time) Labs Review Labs Reviewed  URINE CULTURE  URINALYSIS, ROUTINE W REFLEX MICROSCOPIC    Imaging Review No results found.   EKG Interpretation None      MDM   Final diagnoses:  Dysuria    Patient presents with dysuria and frequency. She is anxious appearing but nontoxic and afebrile on exam. Urinalysis without evidence of urinary tract infection. Given symptoms will culture. Patient is currently also on  antibiotics which may disrupt urinalysis results. Given the patient's symptoms, will place on Pyridium.    After history, exam, and medical workup I feel the patient has been appropriately medically screened and is safe for discharge home. Pertinent diagnoses were discussed with the patient. Patient was given return precautions.     Merryl Hacker, MD 10/21/13 [phone]

## 2013-10-21 NOTE — ED Notes (Signed)
Dysuria and urinary frequency since yesterday, denies fever

## 2013-10-22 LAB — URINE CULTURE
COLONY COUNT: NO GROWTH
CULTURE: NO GROWTH

## 2013-11-01 ENCOUNTER — Encounter: Payer: Self-pay | Admitting: Internal Medicine

## 2013-11-01 ENCOUNTER — Ambulatory Visit (INDEPENDENT_AMBULATORY_CARE_PROVIDER_SITE_OTHER): Payer: BC Managed Care – PPO | Admitting: Internal Medicine

## 2013-11-01 VITALS — BP 98/58 | HR 99 | Temp 97.9°F | Resp 12 | Ht 65.0 in | Wt 98.0 lb

## 2013-11-01 DIAGNOSIS — E041 Nontoxic single thyroid nodule: Secondary | ICD-10-CM

## 2013-11-01 NOTE — Patient Instructions (Signed)
You have "Euthyroid Hashimoto's disease". I suggest having TSH and free T4 tests checked every year and return if they are abnormal.

## 2013-11-01 NOTE — Progress Notes (Signed)
Patient ID: Cheryl Lawson, female   DOB: Dec 13, 1950, 63 y.o.   MRN: [id number]   HPI  Cheryl Lawson is a 63 y.o.-year-old female, referred by her PCP, Dr. Etter Sjogren, for evaluation for small thyroid nodules and euthyroid Hashimoto's thyroiditis.  Thyroid U/S (2013): 2 small thyroid nodules, of 2-3 mm.  I reviewed pt's thyroid tests: Lab Results  Component Value Date   TSH 1.13 10/14/2013   TSH 1.23 05/04/2012   TSH 1.53 10/27/2006   FREET4 0.81 10/14/2013   FREET4 0.76 05/04/2012    Component     Latest Ref Rng 10/14/2013  Thyroid Peroxidase Antibody     <35.0 IU/mL 482.0 (H)   Pt denies feeling nodules in neck, hoarseness, dysphagia/odynophagia, SOB with lying down.  Pt c/o: - she describes that she had a mental breakdown in 07/2013 >> she started to have most of the sxs after this: - + heat intolerance/no cold intolerance - + tremors - occasional palpitations - no hyperdefecation/constipation - + weight loss in last year - no dry skin - + hair loss - + severe anxiety/+ depression - for exercise, walks 3-4x a week >> helps with anxiety She is not sure whether the above sxs are from her anxiety/depression, her SSRI, or from the thyroid condition.   Pt does have a FH of thyroid ds: sister, mother, father, niece. No FH of thyroid cancer. No h/o radiation tx to head or neck. Had Rx tx in R breast.  No seaweed or kelp, no recent contrast studies. No steroid use. No herbal supplements.   ROS: Constitutional: see HPI, + nocturia Eyes: no blurry vision, no xerophthalmia ENT: no sore throat, no nodules palpated in throat, no dysphagia/odynophagia, no hoarseness Cardiovascular: no CP/SOB/palpitations/leg swelling Respiratory: no cough/SOB Gastrointestinal: no N/V/D/C Musculoskeletal: no muscle/joint aches Skin: no rashes, + itching, + hair loss Neurological: + tremors/no numbness/tingling/dizziness Psychiatric: + both: depression/anxiety, also panic attacks  Past Medical History   Diagnosis Date  . Breast cancer   . Depression   . Anxiety    Past Surgical History  Procedure Laterality Date  . Nasal septum surgery    . Infertility surgery    . Tonsillectomy    . Breast lumpectomy      right  . Neck surgery      lift    History   Social History  . Marital Status: Married    Spouse Name: N/A    Number of Children: 1 - adopted, 70 y/o  . Years of Education: 12   Occupational History  .  Adult nurse    Social History Main Topics  . Smoking status: Never Smoker   . Smokeless tobacco: Never Used  . Alcohol Use: Yes     Comment: rare  . Drug Use: No  . Sexual Activity: Yes    Partners: Male   Social History Narrative   HSG. Married - '77. great niece lives with her. 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    Current Outpatient Prescriptions on File Prior to Visit  Medication Sig Dispense Refill  . ALPRAZolam (XANAX) 0.25 MG tablet Take 1 tablet (0.25 mg total) by mouth 3 (three) times daily as needed for anxiety.  60 tablet  0  . sulfamethoxazole-trimethoprim (BACTRIM,SEPTRA) 400-80 MG per tablet Take 1 tablet by mouth 2 (two) times daily.      . phenazopyridine (PYRIDIUM) 200 MG tablet Take 1 tablet (200 mg total) by mouth 3 (three) times daily.  6  tablet  0   No current facility-administered medications on file prior to visit.   No Known Allergies Family History  Problem Relation Age of Onset  . Ovarian cancer Mother   . Colon cancer Mother    PE: BP 98/58  Pulse 99  Temp(Src) 97.9 F (36.6 C) (Oral)  Resp 12  Ht 5\' 5"  (1.651 m)  Wt 98 lb (44.453 kg)  BMI 16.31 kg/m2  SpO2 98% Wt Readings from Last 3 Encounters:  11/01/13 98 lb (44.453 kg)  10/21/13 100 lb (45.36 kg)  10/14/13 104 lb 9.6 oz (47.446 kg)   Constitutional: very thin, looking younger than stated age, in NAD Eyes: PERRLA, EOMI, no exophthalmos ENT: moist mucous membranes, no thyromegaly, no thyroid nodules palpated,no cervical  lymphadenopathy Cardiovascular: tachycardia, RR, No MRG Respiratory: CTA B Gastrointestinal: abdomen soft, NT, ND, BS+ Musculoskeletal: no deformities, strength intact in all 4;  Skin: moist, warm, no rashes Neurological: + tremor with outstretched hands, DTR normal in all 4  ASSESSMENT: 1. 2 small thyroid nodules - thyroid U/S (05/04/2012):   Right thyroid lobe: 12 x 13 x 41 mm, mildly inhomogeneous background parenchyma   Left thyroid lobe: 9 x 12 x 39 mm   Isthmus: 1.7 mm in thickness   Focal nodules: 2 x 3 mm cyst, mid-right 2 x 3 mm hypoechoic solid, inferior right   Lymphadenopathy: None. Bilateral sub centimeter lymph nodes are noted.  2. Euthyroid Hashimoto's thyroiditis  PLAN: 1. Thyroid nodules  - I reviewed the report of her 2013 thyroid ultrasound along with the patient. I pointed out that the 2 nodules are extremely small, so small that we do not even need U/S followup for them. We can repeat an U/S only if she starts having neck compression sxs or these nodules become palpable. Pt does not have a thyroid cancer family history or a personal history of RxTx to head/neck. All these would favor benignity.   2. Euthyroid Hashimoto's thyroiditis - patient with high anti-TPO antibodies per check earlier this month - her TSH and free T4 are normal, and they have been normal at least since 2008 - we discussed that this condition is very unlikely to cause her symptoms of anxiety, depression, weight loss, heat intolerance - I suggested to have her TSH and free T4 checked at least every year as she is at higher risk to develop hypothyroidism. I offered to either see her on a yearly basis to check these labs or to followup with PCP and return if the TSH becomes abnormal in the future. She opted to follow with PCP.

## 2013-11-08 ENCOUNTER — Encounter: Payer: Self-pay | Admitting: Family Medicine

## 2013-11-08 ENCOUNTER — Ambulatory Visit (INDEPENDENT_AMBULATORY_CARE_PROVIDER_SITE_OTHER): Payer: BC Managed Care – PPO | Admitting: Family Medicine

## 2013-11-08 VITALS — BP 98/60 | HR 84 | Temp 97.4°F | Wt 101.4 lb

## 2013-11-08 DIAGNOSIS — F32A Depression, unspecified: Secondary | ICD-10-CM

## 2013-11-08 DIAGNOSIS — E063 Autoimmune thyroiditis: Secondary | ICD-10-CM

## 2013-11-08 DIAGNOSIS — F411 Generalized anxiety disorder: Secondary | ICD-10-CM

## 2013-11-08 DIAGNOSIS — F3289 Other specified depressive episodes: Secondary | ICD-10-CM

## 2013-11-08 DIAGNOSIS — F419 Anxiety disorder, unspecified: Secondary | ICD-10-CM

## 2013-11-08 DIAGNOSIS — R45851 Suicidal ideations: Secondary | ICD-10-CM

## 2013-11-08 DIAGNOSIS — F329 Major depressive disorder, single episode, unspecified: Secondary | ICD-10-CM

## 2013-11-08 MED ORDER — ALPRAZOLAM 0.25 MG PO TABS: 0.2500 mg | ORAL_TABLET | Freq: Three times a day (TID) | ORAL | Status: DC | PRN

## 2013-11-08 NOTE — Progress Notes (Signed)
   Subjective:    Patient ID: Cheryl Lawson, female    DOB: Mar 06, 1951, 63 y.o.   MRN: [id number]  HPI Pt here to f/u anxiety.  No complaints.  She saw endo for hasimotos --- they said to check thyroid annually.   Pt weaning off lexapro.    Review of Systems As above    Objective:   Physical Exam BP 98/60  Pulse 84  Temp(Src) 97.4 F (36.3 C) (Oral)  Wt 101 lb 6.4 oz (45.995 kg)  SpO2 99% General appearance: alert, cooperative, appears stated age and no distress Neurologic: Alert and oriented X 3, normal strength and tone. Normal symmetric reflexes. Normal coordination and gait Psych-- pt fidgety,  Not suicidal, seems anxious but pt does not want to stay on Lexapro       Assessment & Plan:  1. Anxiety con't meds - ALPRAZolam (XANAX) 0.25 MG tablet; Take 1 tablet (0.25 mg total) by mouth 3 (three) times daily as needed for anxiety.  Dispense: 60 tablet; Refill: 0  2. Depression with suicidal ideation con't  xanax0-- pt prefers being off meds

## 2013-11-08 NOTE — Patient Instructions (Signed)

## 2013-11-08 NOTE — Assessment & Plan Note (Addendum)
Pt does not want to inc meds.  She is actually tring to come off lexapro. Con' t xanax  Recheck 3 months

## 2013-11-08 NOTE — Progress Notes (Signed)
Pre visit review using our clinic review tool, if applicable. No additional management support is needed unless otherwise documented below in the visit note. 

## 2013-11-08 NOTE — Assessment & Plan Note (Signed)
Euthyroid Check annual Thyroid panal

## 2013-12-07 ENCOUNTER — Telehealth: Payer: Self-pay

## 2013-12-07 MED ORDER — DULOXETINE HCL 30 MG PO CPEP: 30.0000 mg | ORAL_CAPSULE | Freq: Every day | ORAL | Status: DC

## 2013-12-07 NOTE — Telephone Encounter (Signed)
OK #30 Needs new PCP

## 2013-12-07 NOTE — Telephone Encounter (Signed)
Received a fax from [id number] for a refill on Duloxetine HCL DR 30 mg Cap take 1 tablet daily. I do not see this on present medication list. Please advise.

## 2014-01-09 ENCOUNTER — Other Ambulatory Visit: Payer: Self-pay | Admitting: Family Medicine

## 2014-01-09 NOTE — Telephone Encounter (Signed)
Last seen and filled 11/07/13 #60. Please advise    KP

## 2014-02-21 ENCOUNTER — Ambulatory Visit: Payer: BC Managed Care – PPO | Admitting: Family Medicine

## 2014-02-23 ENCOUNTER — Encounter (HOSPITAL_COMMUNITY): Payer: Self-pay | Admitting: Emergency Medicine

## 2014-02-23 ENCOUNTER — Telehealth: Payer: Self-pay | Admitting: Family Medicine

## 2014-02-23 ENCOUNTER — Emergency Department (HOSPITAL_COMMUNITY)
Admission: EM | Admit: 2014-02-23 | Discharge: 2014-02-24 | Disposition: A | Discharge status: Home / Self Care | Payer: BC Managed Care – PPO | Attending: Emergency Medicine | Admitting: Emergency Medicine

## 2014-02-23 DIAGNOSIS — F411 Generalized anxiety disorder: Secondary | ICD-10-CM | POA: Insufficient documentation

## 2014-02-23 DIAGNOSIS — F3289 Other specified depressive episodes: Secondary | ICD-10-CM | POA: Insufficient documentation

## 2014-02-23 DIAGNOSIS — Z853 Personal history of malignant neoplasm of breast: Secondary | ICD-10-CM | POA: Diagnosis not present

## 2014-02-23 DIAGNOSIS — Z79899 Other long term (current) drug therapy: Secondary | ICD-10-CM | POA: Diagnosis not present

## 2014-02-23 DIAGNOSIS — F418 Other specified anxiety disorders: Secondary | ICD-10-CM | POA: Diagnosis present

## 2014-02-23 DIAGNOSIS — F329 Major depressive disorder, single episode, unspecified: Secondary | ICD-10-CM

## 2014-02-23 DIAGNOSIS — R45851 Suicidal ideations: Secondary | ICD-10-CM

## 2014-02-23 DIAGNOSIS — F32A Depression, unspecified: Secondary | ICD-10-CM

## 2014-02-23 LAB — COMPREHENSIVE METABOLIC PANEL
ALK PHOS: 55 U/L (ref 39–117)
ALT: 14 U/L (ref 0–35)
AST: 30 U/L (ref 0–37)
Albumin: 3.9 g/dL (ref 3.5–5.2)
Anion gap: 12 (ref 5–15)
BUN: 20 mg/dL (ref 6–23)
CHLORIDE: 102 meq/L (ref 96–112)
CO2: 25 meq/L (ref 19–32)
Calcium: 9.5 mg/dL (ref 8.4–10.5)
Creatinine, Ser: 0.86 mg/dL (ref 0.50–1.10)
GFR calc non Af Amer: 72 mL/min — ABNORMAL LOW (ref >90)
GFR, EST AFRICAN AMERICAN: 83 mL/min — AB (ref >90)
GLUCOSE: 78 mg/dL (ref 70–99)
POTASSIUM: 4.3 meq/L (ref 3.7–5.3)
Sodium: 139 mEq/L (ref 137–147)
Total Protein: 6.8 g/dL (ref 6.0–8.3)

## 2014-02-23 LAB — CBC
HEMATOCRIT: 41.6 % (ref 36.0–46.0)
Hemoglobin: 14.2 g/dL (ref 12.0–15.0)
MCH: 30.9 pg (ref 26.0–34.0)
MCHC: 34.1 g/dL (ref 30.0–36.0)
MCV: 90.6 fL (ref 78.0–100.0)
Platelets: 174 10*3/uL (ref 150–400)
RBC: 4.59 MIL/uL (ref 3.87–5.11)
RDW: 12.6 % (ref 11.5–15.5)
WBC: 5.4 10*3/uL (ref 4.0–10.5)

## 2014-02-23 LAB — ETHANOL: Alcohol, Ethyl (B): <11 mg/dL (ref 0–11)

## 2014-02-23 LAB — RAPID URINE DRUG SCREEN, HOSP PERFORMED
AMPHETAMINES: NOT DETECTED
Barbiturates: NOT DETECTED
Benzodiazepines: POSITIVE — AB
Cocaine: NOT DETECTED
OPIATES: NOT DETECTED
Tetrahydrocannabinol: NOT DETECTED

## 2014-02-23 LAB — SALICYLATE LEVEL: Salicylate Lvl: <2 mg/dL — ABNORMAL LOW (ref 2.8–20.0)

## 2014-02-23 LAB — ACETAMINOPHEN LEVEL: Acetaminophen (Tylenol), Serum: <15 ug/mL (ref 10–30)

## 2014-02-23 LAB — HM MAMMOGRAPHY

## 2014-02-23 MED ORDER — ACETAMINOPHEN 325 MG PO TABS: 650.0000 mg | ORAL_TABLET | ORAL | Status: DC | PRN

## 2014-02-23 MED ORDER — ONDANSETRON HCL 4 MG PO TABS: 4.0000 mg | ORAL_TABLET | Freq: Three times a day (TID) | ORAL | Status: DC | PRN

## 2014-02-23 MED ORDER — ZOLPIDEM TARTRATE 5 MG PO TABS
5.0000 mg | ORAL_TABLET | Freq: Every evening | ORAL | Status: DC | PRN
  Administered 2014-02-23: 5 mg via ORAL
  Filled 2014-02-23: qty 1

## 2014-02-23 MED ORDER — IBUPROFEN 200 MG PO TABS: 600.0000 mg | ORAL_TABLET | Freq: Three times a day (TID) | ORAL | Status: DC | PRN

## 2014-02-23 MED ORDER — WHITE PETROLATUM GEL
Status: DC | PRN
  Administered 2014-02-23: 1 via TOPICAL
  Filled 2014-02-23: qty 5

## 2014-02-23 MED ORDER — ALPRAZOLAM 0.25 MG PO TABS
0.2500 mg | ORAL_TABLET | Freq: Three times a day (TID) | ORAL | Status: DC | PRN
  Administered 2014-02-23: 0.25 mg via ORAL
  Filled 2014-02-23: qty 1

## 2014-02-23 NOTE — Telephone Encounter (Signed)
Patient called back and said she spoke with her sister who recommended her to go back on the Cymbalta instead of getting evaluated, I made her aware that her sister is not a doctor and the Cymbalta may not take affect right away, per Dr.Lowne it could take up to 2 months, I encourage the patient that we were concerned and we care about her well being and if she does not go to the Hospital then I would have to call 911 and make them aware of this crisis, the patient started to cry and said she did not want to go to the hospital, I asked to speak with husband to make him aware of what's going on but she said he went out to the bank and she did not want me to tell him. She said he was aware she was doing bad but not sure if he is aware of how bad. She declined going to [facility] for evaluation. I continue to encourage the patient and she asked what would they do and I made her aware, they would evaluate and treat her appropriately, she ssaid she would wait on husband, I made her aware I would call her back before I left for the day.    KP

## 2014-02-23 NOTE — ED Notes (Signed)
Patient reports increase anxiety at this time. Patient is also tearful at this time. Encouragement and support provided and safety maintain.

## 2014-02-23 NOTE — Telephone Encounter (Signed)
Caller name: Maresha Relation to pt: self Call back number: [phone] Pharmacy: [facility]  Reason for call:   Patient would like to know if Dr. Etter Sjogren would send in anxiety medication for her. She states cymbalta and lexapro did not work well for her. She states that she was "in trouble" and I asked her what kind of trouble. Patient states that she "held a gun to her head" earlier today. I called Genevieve Norlander., CMA and forwarded call to her.

## 2014-02-23 NOTE — Telephone Encounter (Signed)
Patient is in the ED per chart.     KP

## 2014-02-23 NOTE — Telephone Encounter (Signed)
Spoke with patient and she stated she is depressed and would like for Dr.Lowne to call her in a prescription, I asked what was going on and she stated her depression is getting worst, she is seeing psych but they can nit prescribe med's. Se said her personal issues are worst and her husband is not aware, she said she feels as if she has no reason to live at this time, she is having suicidal thoughts but no homicidal thought, she has many of weapons at home and did hold a gun to her head today. He husband was not in the home at the time but he is home now, she has tried Lexapro in the past but has not taken any recently. She cancelled her apt yesterday because she thought she ws going to be able to see psych. She said he is not really helping her with her problems. I advised the patient that in her condition we want to be sure she is taken care of and I advised her to go to [facility] ED, I made her aware they will evaluate her and place her on a medication and she can then follow up with Dr.Lowne, I also made her aware we have 2 therapist in the office who would be willing to counsel her after she follows up with Dr.Lowne  , the patient voiced understanding and has agreed to go to the ED and will call for a follow up.       KP

## 2014-02-23 NOTE — Telephone Encounter (Signed)
Please make sure her husband took her to hospital-- she needs to be seen Pt is not there yet

## 2014-02-23 NOTE — Telephone Encounter (Signed)
Attempted call x 3.  Called both patient and husband.  No answer.  Left message on home number for call back.

## 2014-02-23 NOTE — Telephone Encounter (Signed)
agree

## 2014-02-23 NOTE — ED Provider Notes (Signed)
CSN: [id number]     Arrival date & time 02/23/14  1729 History   This chart was scribed for non-physician practitioner working with [name], MD by Mercy Moore, ED Scribe. This patient was seen in room [facility] and the patient's care was started at 5:47 PM.   Chief Complaint  Patient presents with  . Suicidal    The history is provided by the patient. No language interpreter was used.   HPI Comments: Cheryl Lawson is a 63 y.o. female who presents to the Emergency Department stating that she was sent here by her doctor's office. Patient reports desire for antidepressants today for her increased depression. She reports previous use of antidepressants. She discontinued use of the medication due to its side effects. When asked about her depressions patient states that "her brain doesn't want her to be happy." Patient reports suicidal ideation, but denies the ability to follow through with a plan. Patient reports access to gun, but denies ownership of guns. Her husband however does own guns which are securely locked away.  Patient denies audio or visual hallucinations, but reports nightmares. 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 Patient reports weight loss over the past year. She denies homicidal ideation, without provocation.  Per husband, patient reports noticeable change in her behavior; stating that some days she is fine and other days she is not. He attributes her abnormal behavior to recent celebration or 62th birthday and a surgical procedure that has resulted in nerve damage and facial pain. Patient is taking Xanax for her Hashimoto. She shares family history of thyroid disease.   A complete 10 system review of systems was obtained and all systems are negative except as noted in the HPI and PMH.    Past Medical History  Diagnosis Date  . Breast cancer   . Depression   . Anxiety    Past Surgical History  Procedure Laterality  Date  . Nasal septum surgery    . Infertility surgery    . Tonsillectomy    . Breast lumpectomy      right  . Neck surgery      lift    Family History  Problem Relation Age of Onset  . Ovarian cancer Mother   . Colon cancer Mother    History  Substance Use Topics  . Smoking status: Never Smoker   . Smokeless tobacco: Never Used  . Alcohol Use: Yes     Comment: rare   OB History   Grav Para Term Preterm Abortions TAB SAB Ect Mult Living                 Review of Systems See HPI   Allergies  Review of patient's allergies indicates no known allergies.  Home Medications   Prior to Admission medications   Medication Sig Start Date End Date Taking? Authorizing Provider  ALPRAZolam (XANAX XR) 1 MG 24 hr tablet Take 1 mg by mouth daily.   Yes Historical Provider, MD  ALPRAZolam Duanne Moron) 0.25 MG tablet Take 0.25 mg by mouth at bedtime as needed for anxiety.   Yes Historical Provider, MD  clobetasol (TEMOVATE) 0.05 % external solution Apply 1 application topically 2 (two) times daily.   Yes Historical Provider, MD  DULoxetine (CYMBALTA) 30 MG capsule Take 30 mg by mouth daily.   Yes Historical Provider, MD  Multiple Vitamin (MULTIVITAMIN WITH MINERALS) TABS tablet Take 1 tablet by mouth daily.   Yes Historical Provider, MD  vitamin B-12 (CYANOCOBALAMIN) 100 MCG tablet Take 100 mcg by mouth daily.   Yes Historical Provider, MD   Triage Vitals: BP 115/79  Pulse 86  Temp(Src) 98.4 F (36.9 C) (Oral)  Resp 16  SpO2 100%  Physical Exam  Nursing note and vitals reviewed. Constitutional: She is oriented to person, place, and time. She appears well-developed and well-nourished. No distress.  HENT:  Head: Normocephalic.  Right Ear: Tympanic membrane normal.  Left Ear: Tympanic membrane normal.  Mouth/Throat: Oropharynx is clear and moist. No oropharyngeal exudate.  Eyes: Conjunctivae and EOM are normal. Pupils are equal, round, and reactive to light. No scleral icterus.  Neck:  Normal range of motion. Neck supple. No JVD present. No thyromegaly present.  Cardiovascular: Normal rate, regular rhythm, normal heart sounds and intact distal pulses.  Exam reveals no gallop and no friction rub.   No murmur heard. Pulmonary/Chest: Effort normal and breath sounds normal. No respiratory distress. She has no wheezes. She has no rales. She exhibits no tenderness.  Abdominal: Soft. Bowel sounds are normal. She exhibits no distension and no mass. There is no tenderness. There is no rebound and no guarding.  Musculoskeletal: Normal range of motion.  Lymphadenopathy:    She has no cervical adenopathy.  Neurological: She is alert and oriented to person, place, and time. No cranial nerve deficit. Coordination normal.  Normal strength. Normal sensation.   Skin: Skin is warm and dry. She is not diaphoretic.  Psychiatric: She has a normal mood and affect. Her behavior is normal. Judgment and thought content normal.    ED Course  Procedures (including critical care time)  COORDINATION OF CARE: 7:44 PM- Discussed treatment plan with patient at bedside and patient agreed to plan.    Labs Review Labs Reviewed  COMPREHENSIVE METABOLIC PANEL - Abnormal; Notable for the following:    Total Bilirubin <0.2 (*)    GFR calc non Af Amer 72 (*)    GFR calc Af Amer 83 (*)    All other components within normal limits  SALICYLATE LEVEL - Abnormal; Notable for the following:    Salicylate Lvl <8.8 (*)    All other components within normal limits  ACETAMINOPHEN LEVEL  CBC  ETHANOL  URINE RAPID DRUG SCREEN (HOSP PERFORMED)    Imaging Review No results found.   EKG Interpretation None      MDM   Final diagnoses:  Depression  Suicidal ideation    Patient is a 63 y.o. Female who presents to the ED at the urging of her PCP for depression and suicidal ideations.  Physical exam is unremarkable.  Basic labs were drawn here which are unremarkable at this time.  Patient is medically  cleared.  Patient placed in ED psych hold and is currently awaiting TTS consult.  Appreciate psych input.   I personally performed the services described in this documentation, which was scribed in my presence. The recorded information has been reviewed and is accurate.    Cherylann Parr, PA-C 02/23/14 1944

## 2014-02-23 NOTE — ED Notes (Signed)
Pt states doctors office made her come and she does not want to be here. Pt states she wanted emergency antidepressants, states she doesn't look forward to anything anymore, not the person she use to be and cannot enjoy life. Pt states sometimes she thinks committing suicide would get rid of the pain but states she doesn't think she would do it though.

## 2014-02-24 ENCOUNTER — Encounter (HOSPITAL_COMMUNITY): Payer: Self-pay | Admitting: Psychiatry

## 2014-02-24 DIAGNOSIS — F411 Generalized anxiety disorder: Secondary | ICD-10-CM

## 2014-02-24 DIAGNOSIS — F329 Major depressive disorder, single episode, unspecified: Secondary | ICD-10-CM

## 2014-02-24 DIAGNOSIS — F3289 Other specified depressive episodes: Secondary | ICD-10-CM

## 2014-02-24 MED ORDER — ADULT MULTIVITAMIN W/MINERALS CH: 1.0000 | ORAL_TABLET | Freq: Every day | ORAL | Status: DC

## 2014-02-24 MED ORDER — CLOBETASOL PROPIONATE 0.05 % EX SOLN: 1.0000 "application " | Freq: Two times a day (BID) | CUTANEOUS | Status: DC

## 2014-02-24 MED ORDER — DULOXETINE HCL 30 MG PO CPEP: 30.0000 mg | ORAL_CAPSULE | Freq: Every day | ORAL | Status: DC

## 2014-02-24 MED ORDER — MIRTAZAPINE 7.5 MG PO TABS: 7.5000 mg | ORAL_TABLET | Freq: Every day | ORAL | Status: DC

## 2014-02-24 MED ORDER — ALPRAZOLAM ER 1 MG PO TB24: 1.0000 mg | ORAL_TABLET | Freq: Every day | ORAL | Status: DC

## 2014-02-24 MED ORDER — VITAMIN B-12 100 MCG PO TABS: 100.0000 ug | ORAL_TABLET | Freq: Every day | ORAL | Status: DC

## 2014-02-24 NOTE — ED Notes (Signed)
Patient rested through the night without complaint. Libby Maw, RN

## 2014-02-24 NOTE — Consult Note (Signed)
[facility] Consult   Reason for Consult:  Depression, anxiety Referring Physician:  EDP  [name] is an 63 y.o. female. Total Time spent with patient: 20 minutes  Assessment: AXIS I:  Anxiety Disorder NOS and Depressive Disorder NOS AXIS II:  Deferred AXIS III:   Past Medical History  Diagnosis Date  . Breast cancer   . Depression   . Anxiety    AXIS IV:  other psychosocial or environmental problems, problems related to social environment and problems with primary support group AXIS V:  61-70 mild symptoms  Plan:  No evidence of imminent risk to self or others at present.  Dr. Darleene Cleaver assessed the patient and concurs with the plan.  Subjective:   Cheryl Lawson is a 63 y.o. female patient does not warrant admission.  HPI:  The patient has been depressed since last October when she had some facial surgery go "bad", facial deformities-lips.  She has lost weight and depressed, some passive suicidal ideations--"I have a friend who is dying from cancer and sometimes I wish it was me."  Denies suicidal ideations on assessment or plan or past attempt.  She is in the middle of transitioning to a new PCP since hers retired.  Cheryl Lawson has been on Cymbalta (made her sleep too much) and Lexapro ("too many side effects").  She also reports "alone in her marriage" and upset that she and her husband have not had sex in 94 years.  He had gastric bypass last October and is supposedly requesting medication assistance from his providers.  Her sister and father live near her and are good support systems along with 2 brothers.  Cheryl Lawson takes Xanax 1 mg daily for anxiety but would rather be on an anti-depressant.  She is willing to go to [facility] IOP for her depression and anxiety.  HPI Elements:   Location:  generalizaed. Quality:  acute. Severity:  mild . Timing:  intermittent. Duration:  past 11 months. Context:  medical issues.  Past Psychiatric History: Past Medical History  Diagnosis  Date  . Breast cancer   . Depression   . Anxiety     reports that she has never smoked. She has never used smokeless tobacco. She reports that she drinks alcohol. She reports that she does not use illicit drugs. Family History  Problem Relation Age of Onset  . Ovarian cancer Mother   . Colon cancer Mother            Allergies:  No Known Allergies  ACT Assessment Complete:  Yes:    Educational Status    Risk to Self: Risk to self with the past 6 months Is patient at risk for suicide?: Yes Substance abuse history and/or treatment for substance abuse?: No  Risk to Others:    Abuse:    Prior Inpatient Therapy:    Prior Outpatient Therapy:    Additional Information:                    Objective: Blood pressure 103/67, pulse 72, temperature 97.9 F (36.6 C), temperature source Oral, resp. rate 18, SpO2 97.00%.There is no weight on file to calculate BMI. Results for orders placed during the hospital encounter of 02/23/14 (from the past 72 hour(s))  ACETAMINOPHEN LEVEL     Status: None   Collection Time    02/23/14  6:26 PM      Result Value Ref Range   Acetaminophen (Tylenol), Serum <15.0  10 - 30 ug/mL  Comment:            THERAPEUTIC CONCENTRATIONS VARY     SIGNIFICANTLY. A RANGE OF 10-30     ug/mL MAY BE AN EFFECTIVE     CONCENTRATION FOR MANY PATIENTS.     HOWEVER, SOME ARE BEST TREATED     AT CONCENTRATIONS OUTSIDE THIS     RANGE.     ACETAMINOPHEN CONCENTRATIONS     >150 ug/mL AT 4 HOURS AFTER     INGESTION AND >50 ug/mL AT 12     HOURS AFTER INGESTION ARE     OFTEN ASSOCIATED WITH TOXIC     REACTIONS.  CBC     Status: None   Collection Time    02/23/14  6:26 PM      Result Value Ref Range   WBC 5.4  4.0 - 10.5 K/uL   RBC 4.59  3.87 - 5.11 MIL/uL   Hemoglobin 14.2  12.0 - 15.0 g/dL   HCT 41.6  36.0 - 46.0 %   MCV 90.6  78.0 - 100.0 fL   MCH 30.9  26.0 - 34.0 pg   MCHC 34.1  30.0 - 36.0 g/dL   RDW 12.6  11.5 - 15.5 %   Platelets 174  150 -  400 K/uL  COMPREHENSIVE METABOLIC PANEL     Status: Abnormal   Collection Time    02/23/14  6:26 PM      Result Value Ref Range   Sodium 139  137 - 147 mEq/L   Potassium 4.3  3.7 - 5.3 mEq/L   Chloride 102  96 - 112 mEq/L   CO2 25  19 - 32 mEq/L   Glucose, Bld 78  70 - 99 mg/dL   BUN 20  6 - 23 mg/dL   Creatinine, Ser 0.86  0.50 - 1.10 mg/dL   Calcium 9.5  8.4 - 10.5 mg/dL   Total Protein 6.8  6.0 - 8.3 g/dL   Albumin 3.9  3.5 - 5.2 g/dL   AST 30  0 - 37 U/L   Comment: SLIGHT HEMOLYSIS     HEMOLYSIS AT THIS LEVEL MAY AFFECT RESULT   ALT 14  0 - 35 U/L   Alkaline Phosphatase 55  39 - 117 U/L   Total Bilirubin <0.2 (*) 0.3 - 1.2 mg/dL   GFR calc non Af Amer 72 (*) >90 mL/min   GFR calc Af Amer 83 (*) >90 mL/min   Comment: (NOTE)     The eGFR has been calculated using the CKD EPI equation.     This calculation has not been validated in all clinical situations.     eGFR's persistently <90 mL/min signify possible Chronic Kidney     Disease.   Anion gap 12  5 - 15  ETHANOL     Status: None   Collection Time    02/23/14  6:26 PM      Result Value Ref Range   Alcohol, Ethyl (B) <11  0 - 11 mg/dL   Comment:            LOWEST DETECTABLE LIMIT FOR     SERUM ALCOHOL IS 11 mg/dL     FOR MEDICAL PURPOSES ONLY  SALICYLATE LEVEL     Status: Abnormal   Collection Time    02/23/14  6:26 PM      Result Value Ref Range   Salicylate Lvl <3.5 (*) 2.8 - 20.0 mg/dL  URINE RAPID DRUG SCREEN (HOSP PERFORMED)  Status: Abnormal   Collection Time    02/23/14  8:50 PM      Result Value Ref Range   Opiates NONE DETECTED  NONE DETECTED   Cocaine NONE DETECTED  NONE DETECTED   Benzodiazepines POSITIVE (*) NONE DETECTED   Amphetamines NONE DETECTED  NONE DETECTED   Tetrahydrocannabinol NONE DETECTED  NONE DETECTED   Barbiturates NONE DETECTED  NONE DETECTED   Comment:            DRUG SCREEN FOR MEDICAL PURPOSES     ONLY.  IF CONFIRMATION IS NEEDED     FOR ANY PURPOSE, NOTIFY LAB     WITHIN  5 DAYS.                LOWEST DETECTABLE LIMITS     FOR URINE DRUG SCREEN     Drug Class       Cutoff (ng/mL)     Amphetamine      1000     Barbiturate      200     Benzodiazepine   390     Tricyclics       300     Opiates          300     Cocaine          300     THC              50   Labs are reviewed and are pertinent for no medical issues noted.  Current Facility-Administered Medications  Medication Dose Route Frequency Provider Last Rate Last Dose  . acetaminophen (TYLENOL) tablet 650 mg  650 mg Oral Q4H PRN Courtney A Forcucci, PA-C      . ALPRAZolam Duanne Moron) tablet 0.25 mg  0.25 mg Oral TID PRN Jamie Kato Forcucci, PA-C   0.25 mg at 02/23/14 2109  . ibuprofen (ADVIL,MOTRIN) tablet 600 mg  600 mg Oral Q8H PRN Courtney A Forcucci, PA-C      . ondansetron (ZOFRAN) tablet 4 mg  4 mg Oral Q8H PRN Courtney A Forcucci, PA-C      . white petrolatum (VASELINE) gel   Topical PRN Jamie Kato Forcucci, PA-C   1 application at 92/33/00 2110  . zolpidem (AMBIEN) tablet 5 mg  5 mg Oral QHS PRN Courtney A Forcucci, PA-C   5 mg at 02/23/14 2109   Current Outpatient Prescriptions  Medication Sig Dispense Refill  . ALPRAZolam (XANAX XR) 1 MG 24 hr tablet Take 1 mg by mouth daily.      Marland Kitchen ALPRAZolam (XANAX) 0.25 MG tablet Take 0.25 mg by mouth at bedtime as needed for anxiety.      . clobetasol (TEMOVATE) 0.05 % external solution Apply 1 application topically 2 (two) times daily.      . DULoxetine (CYMBALTA) 30 MG capsule Take 30 mg by mouth daily.      . Multiple Vitamin (MULTIVITAMIN WITH MINERALS) TABS tablet Take 1 tablet by mouth daily.      . vitamin B-12 (CYANOCOBALAMIN) 100 MCG tablet Take 100 mcg by mouth daily.        Psychiatric Specialty Exam:     Blood pressure 103/67, pulse 72, temperature 97.9 F (36.6 C), temperature source Oral, resp. rate 18, SpO2 97.00%.There is no weight on file to calculate BMI.  General Appearance: Casual  Eye Contact::  Good  Speech:  Normal Rate   Volume:  Normal  Mood:  Depressed  Affect:  Congruent  Thought Process:  Coherent  Orientation:  Full (Time, Place, and Person)  Thought Content:  WDL  Suicidal Thoughts:  No  Homicidal Thoughts:  No  Memory:  Immediate;   Good Recent;   Good Remote;   Good  Judgement:  Good  Insight:  Fair  Psychomotor Activity:  Normal  Concentration:  Good  Recall:  Good  Fund of Knowledge:Good  Language: Good  Akathisia:  No  Handed:  Right  AIMS (if indicated):     Assets:  Communication Skills Desire for Improvement Financial Resources/Insurance Housing Leisure Time Resilience Social Support Transportation  Sleep:      Musculoskeletal: Strength & Muscle Tone: within normal limits Gait & Station: normal Patient leans: N/A  Treatment Plan Summary: Remeron Rx given; appointment at [facility] IOP for care.  Waylan Boga, [place] 02/24/2014 10:39 AM  Patient seen, evaluated and I agree with notes by Nurse Practitioner. Corena Pilgrim, MD

## 2014-02-24 NOTE — Discharge Instructions (Signed)
Depression Depression is feeling sad, low, down in the dumps, blue, gloomy, or empty. In general, there are two kinds of depression:  Normal sadness or grief. This can happen after something upsetting. It often goes away on its own within 2 weeks. After losing a loved one (bereavement), normal sadness and grief may last longer than two weeks. It usually gets better with time.  Clinical depression. This kind lasts longer than normal sadness or grief. It keeps you from doing the things you normally do in life. It is often hard to function at home, work, or at school. It may affect your relationships with others. Treatment is often needed. GET HELP RIGHT AWAY IF:  You have thoughts about hurting yourself or others.  You lose touch with reality (psychotic symptoms). You may:  See or hear things that are not real.  Have untrue beliefs about your life or people around you.  Your medicine is giving you problems. MAKE SURE YOU:  Understand these instructions.  Will watch your condition.  Will get help right away if you are not doing well or get worse. Document Released: 07/05/2010 Document Revised: 10/17/2013 Document Reviewed: 10/02/2011 Exit[employer] Patient Information 2015 [employer]. This information is not intended to replace advice given to you by your health care provider. Make sure you discuss any questions you have with your health care provider.  

## 2014-02-24 NOTE — [facility] Suicide Risk Assessment (Signed)
Suicide Risk Assessment  Discharge Assessment     Demographic Factors:  Caucasian  Total Time spent with patient: 20 minutes Psychiatric Specialty Exam:     Blood pressure 103/67, pulse 72, temperature 97.9 F (36.6 C), temperature source Oral, resp. rate 18, SpO2 97.00%.There is no weight on file to calculate BMI.  General Appearance: Casual  Eye Contact::  Good  Speech:  Normal Rate  Volume:  Normal  Mood:  Depressed  Affect:  Congruent  Thought Process:  Coherent  Orientation:  Full (Time, Place, and Person)  Thought Content:  WDL  Suicidal Thoughts:  No  Homicidal Thoughts:  No  Memory:  Immediate;   Good Recent;   Good Remote;   Good  Judgement:  Good  Insight:  Fair  Psychomotor Activity:  Normal  Concentration:  Good  Recall:  Good  Fund of Knowledge:Good  Language: Good  Akathisia:  No  Handed:  Right  AIMS (if indicated):     Assets:  Communication Skills Desire for Improvement Financial Resources/Insurance Housing Leisure Time Resilience Social Support Transportation  Sleep:      Musculoskeletal: Strength & Muscle Tone: within normal limits Gait & Station: normal Patient leans: N/A  Mental Status Per Nursing Assessment::   On Admission:   Depression, anxiety  Current Mental Status by Physician: NA  Loss Factors: NA  Historical Factors: NA  Risk Reduction Factors:   Sense of responsibility to family, Living with another person, especially a relative, Positive social support and Positive coping skills or problem solving skills  Continued Clinical Symptoms:  Mild depression, anxiety  Cognitive Features That Contribute To Risk:  None  Suicide Risk:  Minimal: No identifiable suicidal ideation.  Patients presenting with no risk factors but with morbid ruminations; may be classified as minimal risk based on the severity of the depressive symptoms  Discharge Diagnoses:   AXIS I:  Anxiety Disorder NOS and Depressive Disorder NOS AXIS II:   Deferred AXIS III:   Past Medical History  Diagnosis Date  . Breast cancer   . Depression   . Anxiety    AXIS IV:  other psychosocial or environmental problems, problems related to social environment and problems with primary support group AXIS V:  61-70 mild symptoms  Plan Of Care/Follow-up recommendations:  Activity:  as tolerated Diet:  low-sodium heart healthy diet  Is patient on multiple antipsychotic therapies at discharge:  No   Has Patient had three or more failed trials of antipsychotic monotherapy by history:  No  Recommended Plan for Multiple Antipsychotic Therapies: NA    LORD, JAMISON, PMH-NP 02/24/2014, 10:49 AM

## 2014-03-10 ENCOUNTER — Encounter: Payer: Self-pay | Admitting: Family Medicine

## 2014-03-10 ENCOUNTER — Ambulatory Visit (INDEPENDENT_AMBULATORY_CARE_PROVIDER_SITE_OTHER): Payer: BC Managed Care – PPO | Admitting: Family Medicine

## 2014-03-10 VITALS — BP 109/74 | HR 77 | Wt 103.8 lb

## 2014-03-10 DIAGNOSIS — F329 Major depressive disorder, single episode, unspecified: Secondary | ICD-10-CM

## 2014-03-10 DIAGNOSIS — F3289 Other specified depressive episodes: Secondary | ICD-10-CM

## 2014-03-10 DIAGNOSIS — F32A Depression, unspecified: Secondary | ICD-10-CM

## 2014-03-10 DIAGNOSIS — F411 Generalized anxiety disorder: Secondary | ICD-10-CM

## 2014-03-10 MED ORDER — MIRTAZAPINE 15 MG PO TABS: 15.0000 mg | ORAL_TABLET | Freq: Every day | ORAL | Status: DC

## 2014-03-10 MED ORDER — ALPRAZOLAM 0.25 MG PO TABS: 0.2500 mg | ORAL_TABLET | Freq: Every evening | ORAL | Status: DC | PRN

## 2014-03-10 NOTE — Patient Instructions (Signed)

## 2014-03-10 NOTE — Progress Notes (Signed)
Pre visit review using our clinic review tool, if applicable. No additional management support is needed unless otherwise documented below in the visit note. 

## 2014-04-01 NOTE — ED Notes (Signed)
Medical screening examination/treatment/procedure(s) were performed by non-physician practitioner and as supervising physician I was immediately available for consultation/collaboration.   EKG Interpretation None       [name], MD 04/01/14 0111

## 2014-04-17 ENCOUNTER — Encounter: Payer: BC Managed Care – PPO | Admitting: Internal Medicine

## 2014-05-10 ENCOUNTER — Other Ambulatory Visit (INDEPENDENT_AMBULATORY_CARE_PROVIDER_SITE_OTHER): Payer: BC Managed Care – PPO

## 2014-05-10 ENCOUNTER — Ambulatory Visit (INDEPENDENT_AMBULATORY_CARE_PROVIDER_SITE_OTHER): Payer: BC Managed Care – PPO | Admitting: Internal Medicine

## 2014-05-10 ENCOUNTER — Encounter: Payer: Self-pay | Admitting: Internal Medicine

## 2014-05-10 VITALS — BP 112/68 | HR 86 | Temp 97.5°F | Resp 16 | Ht 65.0 in | Wt 104.0 lb

## 2014-05-10 DIAGNOSIS — F411 Generalized anxiety disorder: Secondary | ICD-10-CM

## 2014-05-10 DIAGNOSIS — F4321 Adjustment disorder with depressed mood: Secondary | ICD-10-CM

## 2014-05-10 DIAGNOSIS — E041 Nontoxic single thyroid nodule: Secondary | ICD-10-CM

## 2014-05-10 DIAGNOSIS — Z0000 Encounter for general adult medical examination without abnormal findings: Secondary | ICD-10-CM

## 2014-05-10 DIAGNOSIS — Z853 Personal history of malignant neoplasm of breast: Secondary | ICD-10-CM

## 2014-05-10 LAB — LIPID PANEL
CHOLESTEROL: 227 mg/dL — AB (ref 0–200)
HDL: 64.1 mg/dL (ref >39.00)
LDL Cholesterol: 146 mg/dL — ABNORMAL HIGH (ref 0–99)
NonHDL: 162.9
TRIGLYCERIDES: 83 mg/dL (ref 0.0–149.0)
Total CHOL/HDL Ratio: 4
VLDL: 16.6 mg/dL (ref 0.0–40.0)

## 2014-05-10 LAB — BASIC METABOLIC PANEL
BUN: 14 mg/dL (ref 6–23)
CALCIUM: 9.7 mg/dL (ref 8.4–10.5)
CO2: 29 meq/L (ref 19–32)
CREATININE: 1 mg/dL (ref 0.4–1.2)
Chloride: 102 mEq/L (ref 96–112)
GFR: 63.55 mL/min (ref >60.00)
GLUCOSE: 86 mg/dL (ref 70–99)
Potassium: 4.2 mEq/L (ref 3.5–5.1)
Sodium: 139 mEq/L (ref 135–145)

## 2014-05-10 LAB — T4, FREE: FREE T4: 0.86 ng/dL (ref 0.60–1.60)

## 2014-05-10 LAB — TSH: TSH: 1.71 u[IU]/mL (ref 0.35–4.50)

## 2014-05-10 MED ORDER — ALPRAZOLAM 0.25 MG PO TABS: 0.2500 mg | ORAL_TABLET | Freq: Every evening | ORAL | Status: DC | PRN

## 2014-05-10 NOTE — Patient Instructions (Signed)
We will check some blood work today to make sure that your thyroid is okay. We have also ordered a repeat ultrasound of your next at the beginning make sure the nodules have not changed since the last time they were imaged.  We would like to see you back in about 6 months to check and see how you're doing. I would strongly encourage you to continue with her counseling and maybe couples counseling would also be a good option for you.

## 2014-05-10 NOTE — Assessment & Plan Note (Signed)
Patient is currently taking Remeron and they are gradually increasing the dose. She is also having Xanax that she takes when necessary. She is very distraught about her plastic surgery problems. 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

## 2014-05-10 NOTE — Assessment & Plan Note (Signed)
Order repeat ultrasound, TSH, free T4.

## 2014-05-10 NOTE — Progress Notes (Signed)
   Subjective:    Patient ID: Cheryl Lawson, female DOB: December 19, 1950, 63 y.o.   MRN: [id number]  HPI The patient is a 63 year old female who comes in today to establish care. She has past medical history of depression, anxiety, osteoporosis, migraines. She is very distraught today because in January she had some cosmetic procedure done that she was not very happy with the results. She now feels that her face is ruined and will never be the same. She is a Psychiatric nurse next week. She denies any chest pain shortness breath abdominal pain. She is still struggling with depression and has been following up with a mental health provider that has been increasing her Remeron. She still thinks of suicide however at this time has no concrete plan and does not have any intention of actually committing suicide. She is concerned about her thyroid nodule and states that has not been followed up in some time. There are many family history of thyroid problems.   Review of Systems  Constitutional: Negative for fever, activity change, appetite change, fatigue and unexpected weight change.  HENT: Negative.   Respiratory: Negative for cough, chest tightness, shortness of breath and wheezing.   Cardiovascular: Negative for chest pain, palpitations and leg swelling.  Gastrointestinal: Negative for abdominal pain, diarrhea, constipation and abdominal distention.  Musculoskeletal: Negative for myalgias, back pain and arthralgias.  Skin: Negative.   Neurological: Negative.   Psychiatric/Behavioral: Positive for suicidal ideas, sleep disturbance, dysphoric mood and agitation. Negative for self-injury. The patient is nervous/anxious.       Objective:   Physical Exam  Constitutional: She appears well-developed and well-nourished.  HENT:  Head: Normocephalic and atraumatic.  Eyes: EOM are normal.  Neck: Normal range of motion.  Cardiovascular: Normal rate and regular rhythm.   Pulmonary/Chest: Effort normal and breath  sounds normal. No respiratory distress. She has no wheezes. She has no rales.  Abdominal: Soft. Bowel sounds are normal. She exhibits no distension. There is no tenderness. There is no rebound.  Neurological: She is alert. Coordination normal.  Skin: Skin is warm and dry.   Filed Vitals:   05/10/14 0905  BP: 112/68  Pulse: 86  Temp: 97.5 F (36.4 C)  TempSrc: Oral  Resp: 16  Height: 5\' 5"  (1.651 m)  Weight: 104 lb (47.174 kg)  SpO2: 96%      Assessment & Plan:

## 2014-05-10 NOTE — Progress Notes (Signed)
Pre visit review using our clinic review tool, if applicable. No additional management support is needed unless otherwise documented below in the visit note. 

## 2014-05-10 NOTE — Assessment & Plan Note (Signed)
She does not wish to get mammogram at this time.

## 2014-05-16 ENCOUNTER — Telehealth: Payer: Self-pay | Admitting: Internal Medicine

## 2014-05-16 ENCOUNTER — Encounter: Payer: Self-pay | Admitting: Internal Medicine

## 2014-05-16 ENCOUNTER — Ambulatory Visit
Admission: RE | Admit: 2014-05-16 | Discharge: 2014-05-16 | Disposition: A | Discharge status: Home / Self Care | Payer: BC Managed Care – PPO | Source: Ambulatory Visit | Attending: Internal Medicine | Admitting: Internal Medicine

## 2014-05-16 DIAGNOSIS — E041 Nontoxic single thyroid nodule: Secondary | ICD-10-CM

## 2014-05-16 NOTE — Telephone Encounter (Signed)
Unfortunately we cannot override the pharmacy it is absolutely their right to decline to fill a prescription if it is early.

## 2014-05-16 NOTE — Telephone Encounter (Signed)
Pt called in and said that she took script to pharmacy and they told her that she is not allowed to fill this till the 6th due to the script that she was getting from dr Etter Sjogren.  She is wanting to see if dr Doug Sou can call and ok it before then.  She said that she is out and she can not go without it.

## 2014-05-16 NOTE — Telephone Encounter (Signed)
Notified pt with md response.../lmb 

## 2014-05-23 ENCOUNTER — Other Ambulatory Visit (HOSPITAL_COMMUNITY): Payer: BC Managed Care – PPO | Admitting: Psychiatry

## 2014-05-23 ENCOUNTER — Encounter (HOSPITAL_COMMUNITY): Payer: Self-pay

## 2014-05-23 DIAGNOSIS — F331 Major depressive disorder, recurrent, moderate: Secondary | ICD-10-CM

## 2014-05-23 DIAGNOSIS — G4700 Insomnia, unspecified: Secondary | ICD-10-CM | POA: Diagnosis not present

## 2014-05-23 DIAGNOSIS — F410 Panic disorder [episodic paroxysmal anxiety] without agoraphobia: Secondary | ICD-10-CM | POA: Insufficient documentation

## 2014-05-23 DIAGNOSIS — F411 Generalized anxiety disorder: Secondary | ICD-10-CM | POA: Diagnosis not present
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
## 2014-05-24 ENCOUNTER — Encounter (HOSPITAL_COMMUNITY): Payer: Self-pay | Admitting: Psychiatry

## 2014-05-24 ENCOUNTER — Other Ambulatory Visit (HOSPITAL_COMMUNITY): Payer: BC Managed Care – PPO | Attending: Psychiatry | Admitting: Psychiatry

## 2014-05-24 DIAGNOSIS — F331 Major depressive disorder, recurrent, moderate: Secondary | ICD-10-CM | POA: Diagnosis not present
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
## 2014-05-24 NOTE — Addendum Note (Signed)
Addended by: Donnelly Angelica D on: 05/24/2014 11:00 AM   Modules accepted: Level of Service

## 2014-05-24 NOTE — Progress Notes (Signed)
Psychiatric Assessment Adult  Patient Identification:  Cheryl Lawson Date of Evaluation:  05/24/2014 Chief Complaint: increasing panic attacks and depression History of Chief Complaint:  Ms Merle has a many year history of depression and anxiety.  Her symptoms increased over the last year.  She had facial plastic surgery that did not turn out as well as she hoped and that made her already low opinion of her looks much worse.  She still misses her mother who was really her closest friend she says after her death 6 years ago.  She had a online relationship that ended mutually but she has missed the connection of many years.  Her relationship with her husband is good but there is a lack of intimacy which makes her already low self esteem lower, she says.  She has always seen her self as unattractive and has had low self esteem.  She sees herself as not capable of taking care of herself though she has taken care of others a lot including a great niece she adopted and reared.  She has no support system.  Medications were recently changed and she has actually felt a little better recently.  Still a long way to go but she said she had nothing to lose by being in the program.  HPI Review of Systems Physical Exam  Depressive Symptoms: depressed mood, insomnia, fatigue, feelings of worthlessness/guilt, difficulty concentrating, anxiety, panic attacks, insomnia, loss of energy/fatigue, disturbed sleep, weight loss, weight gain, was losing weight but with mirtazepine has been gaining  (Hypo) Manic Symptoms:   Elevated Mood:  Negative Irritable Mood:  Negative Grandiosity:  Negative Distractibility:  Negative Labiality of Mood:  Negative Delusions:  Negative Hallucinations:  Negative Impulsivity:  Negative Sexually Inappropriate Behavior:  Negative Financial Extravagance:  Negative Flight of Ideas:  Negative  Anxiety Symptoms: Excessive Worry:  Yes Panic Symptoms:  Yes Agoraphobia:   Negative Obsessive Compulsive: No  Symptoms: None, Specific Phobias:  Negative Social Anxiety:  No  Psychotic Symptoms:  Hallucinations: Negative None Delusions:  Negative Paranoia:  Negative   Ideas of Reference:  Negative  PTSD Symptoms: Ever had a traumatic exposure:  Negative Had a traumatic exposure in the last month:  Negative Re-experiencing: Negative None Hypervigilance:  Negative Hyperarousal: Negative None Avoidance: Negative None  Traumatic Brain Injury: Negative NA  Past Psychiatric History: Diagnosis: major depression, recurrent , moderate.  Generalized anxiety disorder  Hospitalizations: none  Outpatient Care: sees a therapist and her PCP for medication  Substance Abuse Care: none  Self-Mutilation: none  Suicidal Attempts: none  Violent Behaviors: none   Past Medical History:   Past Medical History  Diagnosis Date  . Breast cancer   . Depression   . Anxiety    History of Loss of Consciousness:  Negative Seizure History:  Negative Cardiac History:  Negative Allergies:  No Known Allergies Current Medications:  Current Outpatient Prescriptions  Medication Sig Dispense Refill  . ALPRAZolam (XANAX) 0.25 MG tablet Take 1 tablet (0.25 mg total) by mouth at bedtime as needed for anxiety. (Patient taking differently: Take 0.25 mg by mouth 2 (two) times daily as needed for anxiety. ) 30 tablet 1  . clobetasol (TEMOVATE) 0.05 % external solution Apply 1 application topically 2 (two) times daily. For skin disorders 50 mL   . mirtazapine (REMERON) 15 MG tablet Take 1 tablet (15 mg total) by mouth at bedtime. (Patient taking differently: Take 30 mg by mouth at bedtime. ) 30 tablet 2  . Multiple Vitamin (MULTIVITAMIN WITH  MINERALS) TABS tablet Take 1 tablet by mouth daily.     No current facility-administered medications for this visit.    Previous Psychotropic Medications:  Medication Dose   alprazolam   0.25 mg daily prn  mirtazepine 30 mg hs                   Substance Abuse History in the last 12 months:none                                                                                                   Medical Consequences of Substance Abuse: NA  Legal Consequences of Substance Abuse: NA  Family Consequences of Substance Abuse: NA  Blackouts:  Negative DT's:  Negative Withdrawal Symptoms:  Negative None  Social History: Current Place of Residence: [place] Place of Birth: [place] Family Members: husband and adult adopted daughter Marital Status:  Married Children: one adopted daughter aged 70  Sons: 0  Daughters: 1 Relationships: family only Education:  Dentist Problems/Performance: good student Religious Beliefs/Practices: Christian, currently questioning God History of Abuse: emotional (father) Pensions consultant; Military History:  None. Legal History: none Hobbies/Interests: none reported  Family History:   Family History  Problem Relation Age of Onset  . Ovarian cancer Mother   . Colon cancer Mother     Mental Status Examination/Evaluation: Objective:  Appearance: Well Groomed  Eye Contact::  Good  Speech:  Clear and Coherent  Volume:  Normal  Mood:  Depressed and anxious  Affect:  Appropriate  Thought Process:  Coherent and Logical  Orientation:  Full (Time, Place, and Person)  Thought Content:  Negative  Suicidal Thoughts:  No  Homicidal Thoughts:  No  Judgement:  Good  Insight:  Good  Psychomotor Activity:  Normal  Akathisia:  Negative  Handed:  Right  AIMS (if indicated):  0  Assets:  Communication Skills Desire for Improvement Financial Resources/Insurance Housing Leisure Time [place] Talents/Skills Transportation Vocational/Educational    Laboratory/X-Ray Psychological Evaluation(s)   none   none   Assessment:  Depression and anxiety currently improving after a year of deterioration           Treatment  Plan/Recommendations:  Plan of Care: group therapy daily  Laboratory:  none  Psychotherapy: group therapy daily  Medications: continue current meds  Routine PRN Medications:  Negative  Consultations: none  Safety Concerns:  none  Other: none     TAYLOR,GERALD D, MD 12/9/20158:21 AM

## 2014-05-25 ENCOUNTER — Other Ambulatory Visit (HOSPITAL_COMMUNITY): Payer: BC Managed Care – PPO | Admitting: Psychiatry

## 2014-05-25 DIAGNOSIS — F331 Major depressive disorder, recurrent, moderate: Secondary | ICD-10-CM

## 2014-05-25 NOTE — Progress Notes (Signed)
    Daily Group Progress Note  Program: IOP  Group Time: 9:00-10:30  Participation Level: Active  Behavioral Response: Appropriate  Type of Therapy:  Group Therapy  Summary of Progress: Pt. Presented as lethargic and depressed. Pt. Had an anxiety attack at the break when discussing her physical trauma due to plastic surgery. Pt. Was able to following instructions for breathing to calm herself. Pt. Talked appropriately about family of origin, childhood trauma and taking on the role of the "hero" in her family, and poor self-image. Pt. Reports that she exercises daily and observes the affect on her mood when she does not engage in consistent physical exercise.      Group Time: 10:30-12:00  Participation Level:  Active  Behavioral Response: Appropriate  Type of Therapy: Psycho-education Group  Summary of Progress: Pt. Participated in discussion about body image and self-acceptance.   Nancie Neas, LPC

## 2014-05-25 NOTE — Progress Notes (Signed)
    Daily Group Progress Note  Program: IOP  Group Time: 9:00-10:30  Participation Level: Active  Behavioral Response: Appropriate  Type of Therapy:  Group Therapy  Summary of Progress: Pt. Met with case manager and psychiatrist.      Group Time: 10:30-12:00  Participation Level:  Active  Behavioral Response: Appropriate  Type of Therapy: Psycho-education Group  Summary of Progress: Pt. Participated in wheel of life activity.   Nancie Neas, LPC

## 2014-05-26 ENCOUNTER — Other Ambulatory Visit (HOSPITAL_COMMUNITY): Payer: BC Managed Care – PPO

## 2014-05-29 ENCOUNTER — Other Ambulatory Visit (HOSPITAL_COMMUNITY): Payer: BC Managed Care – PPO | Admitting: Psychiatry

## 2014-05-29 DIAGNOSIS — F331 Major depressive disorder, recurrent, moderate: Secondary | ICD-10-CM | POA: Diagnosis not present

## 2014-05-29 NOTE — Progress Notes (Signed)
    Daily Group Progress Note  Program: IOP  Group Time: 9:00-10:30  Participation Level: Active  Behavioral Response: Appropriate  Type of Therapy:  Group Therapy  Summary of Progress: Pt. Reported that she had a disappointing trip to [place] over the weekend. Pt. Reported that she was not able to exercise Friday and Saturday and began to feel very depressed. Pt. Reported that she used medication to withdraw on Saturday, but that her husband was able to get her out of bed on Sunday. Pt. Reports concern about mood and dropping to severe lows very rapidly on medication.      Group Time: 10:30-12:00  Participation Level:  Active  Behavioral Response: Appropriate  Type of Therapy: Psycho-education Group  Summary of Progress: Pt. Participated in discussion about developing healthy relationship boundaries.   Nancie Neas, LPC

## 2014-05-30 ENCOUNTER — Other Ambulatory Visit (HOSPITAL_COMMUNITY): Payer: BC Managed Care – PPO | Admitting: Psychiatry

## 2014-05-30 DIAGNOSIS — F331 Major depressive disorder, recurrent, moderate: Secondary | ICD-10-CM | POA: Diagnosis not present

## 2014-05-31 ENCOUNTER — Ambulatory Visit (INDEPENDENT_AMBULATORY_CARE_PROVIDER_SITE_OTHER): Payer: BC Managed Care – PPO | Admitting: Family

## 2014-05-31 ENCOUNTER — Other Ambulatory Visit (HOSPITAL_COMMUNITY): Payer: BC Managed Care – PPO | Admitting: Psychiatry

## 2014-05-31 ENCOUNTER — Encounter: Payer: Self-pay | Admitting: Family

## 2014-05-31 VITALS — BP 100/62 | HR 84 | Temp 98.0°F | Resp 18 | Ht 65.0 in | Wt 108.0 lb

## 2014-05-31 DIAGNOSIS — F331 Major depressive disorder, recurrent, moderate: Secondary | ICD-10-CM | POA: Diagnosis not present

## 2014-05-31 DIAGNOSIS — H61892 Other specified disorders of left external ear: Secondary | ICD-10-CM | POA: Insufficient documentation

## 2014-05-31 DIAGNOSIS — F410 Panic disorder [episodic paroxysmal anxiety] without agoraphobia: Secondary | ICD-10-CM

## 2014-05-31 NOTE — Progress Notes (Signed)
Pre visit review using our clinic review tool, if applicable. No additional management support is needed unless otherwise documented below in the visit note. 

## 2014-05-31 NOTE — Assessment & Plan Note (Signed)
Nodules behind left ear consistent with potential postauricular lymph nodes. Given that they're soft and mobile likely benign. Continue to monitor for size and shape changes as needed.

## 2014-05-31 NOTE — Patient Instructions (Signed)
Thank you for choosing [facility].  Summary/Instructions:  There is no treatment that is needed at this time. It appears to be a small cyst or collection of tissue. Continue to monitor at this time for shape and size changes.   If your symptoms worsen or fail to improve, please contact our office for further instruction, or in case of emergency go directly to the emergency room at the closest medical facility.

## 2014-05-31 NOTE — Progress Notes (Signed)
   Subjective:    Patient ID: Cheryl Lawson, female    DOB: 1951-04-18, 63 y.o.   MRN: [id number]  Chief Complaint  Patient presents with  . Nodules    behind left ear, worried about cancer, has had cancer before, severe depression and anxiety     HPI:  [name] is a 63 y.o. female who presents today for nodules behind ear.  1) Knots behind ear - Acute knots were noted a couple of days ago. Unsure if they have changed in size, indicates they are about pea sized. Denies any discharge. Denies any treatments. Does not believe that anything makes it better or worse.   No Known Allergies   Current Outpatient Prescriptions on File Prior to Visit  Medication Sig Dispense Refill  . ALPRAZolam (XANAX) 0.25 MG tablet Take 1 tablet (0.25 mg total) by mouth at bedtime as needed for anxiety. (Patient taking differently: Take 0.25 mg by mouth 2 (two) times daily as needed for anxiety. ) 30 tablet 1  . clobetasol (TEMOVATE) 0.05 % external solution Apply 1 application topically 2 (two) times daily. For skin disorders 50 mL   . mirtazapine (REMERON) 15 MG tablet Take 1 tablet (15 mg total) by mouth at bedtime. (Patient taking differently: Take 30 mg by mouth at bedtime. ) 30 tablet 2  . Multiple Vitamin (MULTIVITAMIN WITH MINERALS) TABS tablet Take 1 tablet by mouth daily.     No current facility-administered medications on file prior to visit.    Review of Systems    See HPI  Objective:    BP 100/62 mmHg  Pulse 84  Temp(Src) 98 F (36.7 C) (Oral)  Resp 18  Ht 5\' 5"  (1.651 m)  Wt 108 lb (48.988 kg)  BMI 17.97 kg/m2  SpO2 96% Nursing note and vital signs reviewed.  Physical Exam  Constitutional: She is oriented to person, place, and time. She appears well-developed and well-nourished. No distress.  HENT:  2 small nodules noted in posterior radicular lymph node area. Areas are soft and mobile  Cardiovascular: Normal rate, regular rhythm, normal heart sounds and intact distal  pulses.   Pulmonary/Chest: Effort normal and breath sounds normal.  Neurological: She is alert and oriented to person, place, and time.  Skin: Skin is warm and dry.  Psychiatric: She has a normal mood and affect. Her behavior is normal. Judgment and thought content normal.       Assessment & Plan:

## 2014-06-01 ENCOUNTER — Other Ambulatory Visit (HOSPITAL_COMMUNITY): Payer: BC Managed Care – PPO | Admitting: Psychiatry

## 2014-06-01 DIAGNOSIS — F331 Major depressive disorder, recurrent, moderate: Secondary | ICD-10-CM | POA: Diagnosis not present

## 2014-06-01 NOTE — Progress Notes (Signed)
    Daily Group Progress Note  Program: IOP  Group Time: 9:00-10:30  Participation Level: Active  Behavioral Response: Appropriate  Type of Therapy:  Group Therapy  Summary of Progress: Pt. Reported that she was feeling "fair". Pt. Reported that she went to see her father yesterday who is in good health. Pt. Reported that she was feeling anxious, but was able to walk yesterday and felt that it helped her with mood and coping.      Group Time: 10:30-12:00  Participation Level:  Active  Behavioral Response: Appropriate  Type of Therapy: Psycho-education Group  Summary of Progress: Pt. Participated in self-identity development exercise with the "I am..." poem.   Nancie Neas, LPC

## 2014-06-01 NOTE — Progress Notes (Signed)
    Daily Group Progress Note  Program: IOP  Group Time: 9:00-10:30  Participation Level: Active  Behavioral Response: Appropriate  Type of Therapy:  Group Therapy  Summary of Progress: Pt. Discussed issues with self-image. Pt. Stated "I don't know how to be happy" and is currently challenged by insomnia. Pt. Continues to use daily walk/jogging for coping and reports that she feels that the activity enhances her mood.      Group Time: 10:30-12:00  Participation Level:  Active  Behavioral Response: Appropriate  Type of Therapy: Psycho-education Group  Summary of Progress: Pt. Participated in group about developing self-care plan.   Nancie Neas, LPC

## 2014-06-02 ENCOUNTER — Other Ambulatory Visit (HOSPITAL_COMMUNITY): Payer: BC Managed Care – PPO | Admitting: Psychiatry

## 2014-06-02 DIAGNOSIS — F331 Major depressive disorder, recurrent, moderate: Secondary | ICD-10-CM | POA: Diagnosis not present

## 2014-06-02 NOTE — Progress Notes (Signed)
    Daily Group Progress Note  Program: IOP  Group Time: 9:00-10:30  Participation Level: Active  Behavioral Response: Appropriate  Type of Therapy:  Group Therapy  Summary of Progress: Pt. Smiled appropriately. Pt. Rocks during group, appears anxious and restless. Pt. Reported that she was doing "ok". Pt.reported that she is worried about her daughter coming home and potential conflict because of daughter's personality.      Group Time: 10:30-12:00  Participation Level:  Active  Behavioral Response: Appropriate  Type of Therapy: Psycho-education Group  Summary of Progress: Pt. Participated in group facilitated by the mental health association.   Nancie Neas, LPC

## 2014-06-02 NOTE — Progress Notes (Signed)
    Daily Group Progress Note  Program: IOP  Group Time: 9:00-10:30  Participation Level: Active  Behavioral Response: Appropriate  Type of Therapy:  Group Therapy  Summary of Progress: Pt. Smiled and laughed appropriately. Pt. Continues to rock in her chair. Pt. Discussed conflict between herself and her daughter. Pt. Discussed that she was able to communicate boundaries with her daughter assertively, but recognizes that her daughter is needy and immature and a major stressor.      Group Time: 10:30-12:00  Participation Level:  Active  Behavioral Response: Appropriate  Type of Therapy: Psycho-education Group  Summary of Progress: Pt. Participated in discussion about developing creativity in recovery. Pt. Shared her floral arrangements with the group.   Nancie Neas, LPC

## 2014-06-05 ENCOUNTER — Other Ambulatory Visit (HOSPITAL_COMMUNITY): Payer: BC Managed Care – PPO | Admitting: Psychiatry

## 2014-06-05 DIAGNOSIS — F331 Major depressive disorder, recurrent, moderate: Secondary | ICD-10-CM | POA: Diagnosis not present

## 2014-06-06 ENCOUNTER — Other Ambulatory Visit (HOSPITAL_COMMUNITY): Payer: BC Managed Care – PPO | Admitting: Psychiatry

## 2014-06-06 DIAGNOSIS — F331 Major depressive disorder, recurrent, moderate: Secondary | ICD-10-CM

## 2014-06-07 ENCOUNTER — Encounter (HOSPITAL_COMMUNITY): Payer: Self-pay | Admitting: Psychiatry

## 2014-06-07 ENCOUNTER — Other Ambulatory Visit (HOSPITAL_COMMUNITY): Payer: BC Managed Care – PPO | Admitting: Psychiatry

## 2014-06-07 DIAGNOSIS — F331 Major depressive disorder, recurrent, moderate: Secondary | ICD-10-CM | POA: Diagnosis not present

## 2014-06-07 NOTE — Progress Notes (Signed)
  [facility] Discharge Summary  Cheryl Lawson [id number]  Admission date: 05/23/2014 Discharge date: 06/07/2014  Reason for admission: increasing anxiety and depression  Chemical Use History: none  Family of Origin Issues: none currently  Progress in Program Toward Treatment Goals: Cheryl Lawson said she was pleased with the support and help she got in group.  Her anxiety and depression are much less, she realizes she has too much time on her hands and will get involved in something more structured.  She realizes she needs to say "no" to people and will continue to work on that as well.  Progress (rationale): medications were helpful but Cheryl Lawson desire to get better and willingness to participate fully in therapy was the most helpful.    Clarene Reamer, MD 06/07/2014

## 2014-06-07 NOTE — Progress Notes (Signed)
Cheryl Lawson is a 63 y.o. , married, Caucasian female, who was referred per [facility]; treatment for depressive symptoms with panic attacks. Stated she was having panic attacks several times a day, but they've lessened since medication adjustment. Symptoms included: Increased appetite, poor concentration, no energy, low motivation, crying spells, and anhedonia. Pt denies SI/HI and A/V hallucinations. According to pt, her symptoms started to worsen this year. Stressors/Triggers: 1) Health/Cosmetic Issues: Pt has an Auto-Immune Disorder. Also, on 03-22-13 pt had a "botched" facial surgery, which deformed her lips. The surgery was in [place]. 2) Unresolved grief/loss issues: Pt's mother died in 2007/09/18. 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 Hx of breast cancer 1996/09/17). States she was very close to her. In September 2015, the married man she was having an affair with ended their relationship. Pt has been married for thirty eight years to her husband. They own a business together. Pt stated there hasn't been any intimacy. He recently had gastric bypass surgery and has lost 30 lbs. 3) No support system. Pt denied any psychiatric hospitalizations and suicide attempts. Has seen Pattricia Boss, [facility] for ~ six times and Dr. Delman Cheadle twice.  Denied family hx of psychiatric illnesses.  Pt completed MH-IOP today.  Reports overall mood improved.  Denies SI/HI or A/V hallucinations.  Coping better with stressors.  A:  D/C today.  F/U with Dr. Delman Cheadle on 06-19-14 @ 3 pm.  Pt will contact Pattricia Boss, [facility] for an appointment.  Encouraged support groups.  R:  Pt receptive.

## 2014-06-07 NOTE — Patient Instructions (Signed)
Patient completed MH-IOP today.  Will follow up with Dr. Delman Cheadle on 06-19-14 @ 3 pm and Pattricia Boss, [facility] (patient will schedule appointment).  Encouraged support groups.

## 2014-06-07 NOTE — Progress Notes (Signed)
    Daily Group Progress Note  Program: IOP  Group Time: 9:00-10:30  Participation Level: Active  Behavioral Response: Appropriate  Type of Therapy:  Group Therapy  Summary of Progress: Pt. Presented with mild anxiety, smiled and laughed. Pt. Talked appropriately in group. Pt. Continues to be challenged by lack of purpose and meaning in her life. Pt. Is also challenged by poor boundaries in relationship with her daughter.      Group Time: 10:30-12:00  Participation Level:  Active  Behavioral Response: Appropriate  Type of Therapy: Psycho-education Group  Summary of Progress: Pt. Participated in discussion about countering cognitive distortions.   Nancie Neas, LPC

## 2014-06-08 ENCOUNTER — Other Ambulatory Visit (HOSPITAL_COMMUNITY): Payer: BC Managed Care – PPO

## 2014-06-08 NOTE — Progress Notes (Signed)
    Daily Group Progress Note  Program: IOP  Group Time: 9:00-10:30  Participation Level: Active  Behavioral Response: Appropriate  Type of Therapy:  Group Therapy  Summary of Progress: Pt. Smiled and talked appropriately. Pt. Presented as calm and responsive to feedback from others. Pt. Reported anger and resentment because of lack of intimacy      Group Time: 10:30-12:00  Participation Level:  Active  Behavioral Response: Appropriate  Type of Therapy: Psycho-education Group  Summary of Progress: Pt. Participated in discussion about developing a self-care plan and participated in planning of self-care holiday party.  Nancie Neas, LPC

## 2014-06-08 NOTE — Progress Notes (Signed)
    Daily Group Progress Note  Program: IOP  Group Time: 9:00-10:30  Participation Level: Active  Behavioral Response: Appropriate  Type of Therapy:  Group Therapy  Summary of Progress: Pt. Prepared for discharge. Pt. Demonstrated some anxiety and shaking in anticipation of discharge from the group. Pt. Was encouraged to focus on her creativity and volunteer work as coping strategies.      Group Time: 10:30-12:00  Participation Level:  Active  Behavioral Response: Appropriate  Type of Therapy: Psycho-education Group  Summary of Progress: Pt. Participated in self-care holiday party (i.e., guided imagery, aromatherapy, and benefits of chocolate.)  Nancie Neas, LPC

## 2014-06-12 ENCOUNTER — Other Ambulatory Visit (HOSPITAL_COMMUNITY): Payer: BC Managed Care – PPO

## 2014-06-13 ENCOUNTER — Other Ambulatory Visit (HOSPITAL_COMMUNITY): Payer: BC Managed Care – PPO

## 2014-06-14 ENCOUNTER — Other Ambulatory Visit (HOSPITAL_COMMUNITY): Payer: BC Managed Care – PPO

## 2014-06-15 ENCOUNTER — Other Ambulatory Visit (HOSPITAL_COMMUNITY): Payer: BC Managed Care – PPO

## 2014-06-19 ENCOUNTER — Other Ambulatory Visit (HOSPITAL_COMMUNITY): Payer: BC Managed Care – PPO

## 2014-06-20 ENCOUNTER — Other Ambulatory Visit (HOSPITAL_COMMUNITY): Payer: BC Managed Care – PPO

## 2014-06-21 ENCOUNTER — Other Ambulatory Visit (HOSPITAL_COMMUNITY): Payer: BC Managed Care – PPO

## 2014-06-22 ENCOUNTER — Other Ambulatory Visit (HOSPITAL_COMMUNITY): Payer: BC Managed Care – PPO

## 2014-06-23 ENCOUNTER — Other Ambulatory Visit (HOSPITAL_COMMUNITY): Payer: BC Managed Care – PPO

## 2014-06-26 ENCOUNTER — Other Ambulatory Visit (HOSPITAL_COMMUNITY): Payer: BC Managed Care – PPO

## 2014-06-27 ENCOUNTER — Other Ambulatory Visit (HOSPITAL_COMMUNITY): Payer: BC Managed Care – PPO

## 2014-06-28 ENCOUNTER — Other Ambulatory Visit (HOSPITAL_COMMUNITY): Payer: BC Managed Care – PPO

## 2014-06-29 ENCOUNTER — Other Ambulatory Visit (HOSPITAL_COMMUNITY): Payer: BC Managed Care – PPO

## 2014-06-30 ENCOUNTER — Other Ambulatory Visit (HOSPITAL_COMMUNITY): Payer: BC Managed Care – PPO

## 2014-07-03 ENCOUNTER — Other Ambulatory Visit (HOSPITAL_COMMUNITY): Payer: BC Managed Care – PPO

## 2014-07-04 ENCOUNTER — Other Ambulatory Visit (HOSPITAL_COMMUNITY): Payer: BC Managed Care – PPO

## 2014-07-05 ENCOUNTER — Other Ambulatory Visit (HOSPITAL_COMMUNITY): Payer: BC Managed Care – PPO

## 2014-07-06 ENCOUNTER — Other Ambulatory Visit (HOSPITAL_COMMUNITY): Payer: BC Managed Care – PPO

## 2014-07-07 ENCOUNTER — Other Ambulatory Visit (HOSPITAL_COMMUNITY): Payer: BC Managed Care – PPO

## 2014-07-10 ENCOUNTER — Other Ambulatory Visit (HOSPITAL_COMMUNITY): Payer: BC Managed Care – PPO

## 2014-07-11 ENCOUNTER — Other Ambulatory Visit (HOSPITAL_COMMUNITY): Payer: BC Managed Care – PPO

## 2014-07-12 ENCOUNTER — Other Ambulatory Visit (HOSPITAL_COMMUNITY): Payer: BC Managed Care – PPO

## 2014-07-13 ENCOUNTER — Other Ambulatory Visit (HOSPITAL_COMMUNITY): Payer: BC Managed Care – PPO

## 2014-07-14 ENCOUNTER — Other Ambulatory Visit (HOSPITAL_COMMUNITY): Payer: BC Managed Care – PPO

## 2014-07-17 ENCOUNTER — Other Ambulatory Visit (HOSPITAL_COMMUNITY): Payer: BC Managed Care – PPO

## 2014-07-19 ENCOUNTER — Telehealth: Payer: Self-pay | Admitting: Internal Medicine

## 2014-07-19 NOTE — Telephone Encounter (Signed)
Please advise on any immunizations patient may need, thanks.

## 2014-07-19 NOTE — Telephone Encounter (Signed)
Pt is traveling out of country and wanted to know if Dr Doug Sou suggested she have any meds on immunizations?    Traveling to [employer]

## 2014-07-20 NOTE — Telephone Encounter (Signed)
There is a travel clinic through [place] in town that I would be happy to refer her to as they have good knowledge and can talk to her about what she is doing there and what her risks for certain diseases would be. Unable to comment over the phone. Additionally the travel clinic has access to the injections there if she needs any.    Patient should call: [phone] to schedule.

## 2014-07-20 NOTE — Telephone Encounter (Signed)
Left message for patient to call me back. 

## 2014-07-28 ENCOUNTER — Telehealth: Payer: Self-pay | Admitting: Internal Medicine

## 2014-07-28 NOTE — Telephone Encounter (Signed)
Is traveling outside of the country and would like to know if there are any shots she needs to take.

## 2014-07-28 NOTE — Telephone Encounter (Signed)
Per Dr. Doug Sou Cheryl Lawson needs to go to a travel clinic. I called and spoke with Cheryl Lawson.

## 2014-08-01 ENCOUNTER — Emergency Department (HOSPITAL_BASED_OUTPATIENT_CLINIC_OR_DEPARTMENT_OTHER): Payer: BLUE CROSS/BLUE SHIELD

## 2014-08-01 ENCOUNTER — Encounter (HOSPITAL_BASED_OUTPATIENT_CLINIC_OR_DEPARTMENT_OTHER): Payer: Self-pay | Admitting: *Deleted

## 2014-08-01 ENCOUNTER — Emergency Department (HOSPITAL_BASED_OUTPATIENT_CLINIC_OR_DEPARTMENT_OTHER)
Admission: EM | Admit: 2014-08-01 | Discharge: 2014-08-01 | Disposition: A | Discharge status: Home / Self Care | Payer: BLUE CROSS/BLUE SHIELD | Attending: Emergency Medicine | Admitting: Emergency Medicine

## 2014-08-01 DIAGNOSIS — Z7952 Long term (current) use of systemic steroids: Secondary | ICD-10-CM | POA: Diagnosis not present

## 2014-08-01 DIAGNOSIS — Z79899 Other long term (current) drug therapy: Secondary | ICD-10-CM | POA: Diagnosis not present

## 2014-08-01 DIAGNOSIS — Z87828 Personal history of other (healed) physical injury and trauma: Secondary | ICD-10-CM | POA: Insufficient documentation

## 2014-08-01 DIAGNOSIS — F419 Anxiety disorder, unspecified: Secondary | ICD-10-CM | POA: Diagnosis not present

## 2014-08-01 DIAGNOSIS — Z853 Personal history of malignant neoplasm of breast: Secondary | ICD-10-CM | POA: Insufficient documentation

## 2014-08-01 DIAGNOSIS — M25561 Pain in right knee: Secondary | ICD-10-CM

## 2014-08-01 DIAGNOSIS — F329 Major depressive disorder, single episode, unspecified: Secondary | ICD-10-CM | POA: Diagnosis not present

## 2014-08-01 MED ORDER — IBUPROFEN 800 MG PO TABS: 800.0000 mg | ORAL_TABLET | Freq: Three times a day (TID) | ORAL | Status: AC

## 2014-08-01 NOTE — ED Notes (Signed)
Pain in her right knee with radiation down her leg.

## 2014-08-01 NOTE — ED Notes (Signed)
Pt. Went to radiology and refused the x ray until she can speak to EDP.  Pt. Cheryl Lawson she wants an MRI.

## 2014-08-01 NOTE — ED Notes (Signed)
Pt stated " xray will not do any good", pt stated multiple times that she wanted an MRI, pt wanted to discuss plan of treatment with MD

## 2014-08-01 NOTE — Discharge Instructions (Signed)
As discussed, your evaluation today has been largely reassuring.  But, it is important that you monitor your condition carefully, and do not hesitate to return to the ED if you develop new, or concerning changes in your condition.  For the next 4 days please use all medication as directed, ice packs up to 4 times daily.  Please be sure to follow-up with our orthopedists if your pain has not diminished substantially by Monday.

## 2014-08-01 NOTE — ED Provider Notes (Signed)
CSN: [id number]     Arrival date & time 08/01/14  1456 History   First MD Initiated Contact with Patient 08/01/14 1646     Chief Complaint  Patient presents with  . Leg Pain     (Consider location/radiation/quality/duration/timing/severity/associated sxs/prior Treatment) HPI Patient presents with concern of right knee pain. Pain began 3 days ago, after the patient set for a prolonged time with her knees in extreme flexion. Since that time she has had pain focally in the infrapatellar area, laterally, particularly with walking.  Pain is sore, moderate, persistent. Minimal relief with OTC medication. Patient voices a concern over prior injury, having recurred, specifically meniscus tear. She denies any distal dysesthesia or weakness, any chest pain, dyspnea or any other focal changes from baseline. Past Medical History  Diagnosis Date  . Breast cancer   . Depression   . Anxiety    Past Surgical History  Procedure Laterality Date  . Nasal septum surgery    . Infertility surgery    . Tonsillectomy    . Breast lumpectomy      right  . Neck surgery      lift    Family History  Problem Relation Age of Onset  . Ovarian cancer Mother   . Colon cancer Mother    History  Substance Use Topics  . Smoking status: Never Smoker   . Smokeless tobacco: Never Used  . Alcohol Use: Yes     Comment: rare   OB History    No data available     Review of Systems  Constitutional: Negative for fever and chills.  Respiratory: Negative for chest tightness and shortness of breath.   Cardiovascular: Negative for chest pain.  Musculoskeletal:       Hpi  Skin: Negative for color change and rash.  Allergic/Immunologic: Negative for immunocompromised state.      Allergies  Review of patient's allergies indicates no known allergies.  Home Medications   Prior to Admission medications   Medication Sig Start Date End Date Taking? Authorizing Provider  ALPRAZolam (XANAX) 0.25 MG tablet  Take 1 tablet (0.25 mg total) by mouth at bedtime as needed for anxiety. Patient taking differently: Take 0.25 mg by mouth 2 (two) times daily as needed for anxiety.  05/10/14   Olga Millers, MD  clobetasol (TEMOVATE) 0.05 % external solution Apply 1 application topically 2 (two) times daily. For skin disorders 02/24/14   Waylan Boga, NP  ibuprofen (ADVIL,MOTRIN) 800 MG tablet Take 1 tablet (800 mg total) by mouth 3 (three) times daily. 08/01/14 08/04/14  Carmin Muskrat, MD  mirtazapine (REMERON) 15 MG tablet Take 1 tablet (15 mg total) by mouth at bedtime. Patient taking differently: Take 30 mg by mouth at bedtime.  03/10/14   Rosalita Chessman, DO  Multiple Vitamin (MULTIVITAMIN WITH MINERALS) TABS tablet Take 1 tablet by mouth daily. 02/24/14   Waylan Boga, NP   BP 112/77 mmHg  Pulse 84  Temp(Src) 97.8 F (36.6 C) (Oral)  Resp 16  Ht 5\' 5"  (1.651 m)  Wt 107 lb (48.535 kg)  BMI 17.81 kg/m2  SpO2 100% Physical Exam  Constitutional: She is oriented to person, place, and time. She appears well-developed and well-nourished. No distress.  HENT:  Head: Normocephalic and atraumatic.  Eyes: Conjunctivae and EOM are normal.  Cardiovascular: Normal rate, regular rhythm and intact distal pulses.   Pulmonary/Chest: Effort normal. No stridor. No respiratory distress.  Musculoskeletal: She exhibits no edema.       Right  knee: She exhibits normal range of motion, no swelling, no effusion, no ecchymosis, no deformity, no laceration, no erythema, normal alignment, no LCL laxity and normal patellar mobility.       Left knee: Normal.       Right ankle: Normal.       Legs: Neurological: She is alert and oriented to person, place, and time. No cranial nerve deficit.  Skin: Skin is warm and dry.  Psychiatric: She has a normal mood and affect.  Nursing note and vitals reviewed.   ED Course  Procedures (including critical care time) Imaging not indicated  MDM   Final diagnoses:  Knee pain, right     Patient presents with knee pain, has a reassuring physical exam, vital signs, and story of her condition. Symptoms likely due to patella tendon strain, with low suspicion for other acute arthralgia, or systemic pathology.  She was started on cryotherapy, ibuprofen, will follow up with orthopedics.  Carmin Muskrat, MD 08/01/14 [phone]

## 2014-08-23 ENCOUNTER — Encounter: Payer: Self-pay | Admitting: Family

## 2014-08-23 ENCOUNTER — Ambulatory Visit (INDEPENDENT_AMBULATORY_CARE_PROVIDER_SITE_OTHER): Payer: BLUE CROSS/BLUE SHIELD | Admitting: Family

## 2014-08-23 VITALS — BP 110/72 | HR 90 | Temp 98.4°F | Resp 18 | Ht 65.0 in | Wt 108.8 lb

## 2014-08-23 DIAGNOSIS — R059 Cough, unspecified: Secondary | ICD-10-CM

## 2014-08-23 DIAGNOSIS — R05 Cough: Secondary | ICD-10-CM

## 2014-08-23 LAB — HM PAP SMEAR

## 2014-08-23 MED ORDER — AMOXICILLIN-POT CLAVULANATE 875-125 MG PO TABS: 1.0000 | ORAL_TABLET | Freq: Two times a day (BID) | ORAL | Status: DC

## 2014-08-23 NOTE — Assessment & Plan Note (Signed)
Symptoms and exam consistent with bacterial sinusitis. Start Augmentin. Continue over-the-counter medications as if her symptom relief and supportive care. Follow up if symptoms worsen or fail to improve.

## 2014-08-23 NOTE — Progress Notes (Signed)
   Subjective:    Patient ID: Cheryl Lawson, female    DOB: [birth year], 64 y.o.   MRN: [id number]  Chief Complaint  Patient presents with  . Nasal Congestion    x1 and a half weeks, congestion, drainage, productive cough, chills and body aches    HPI:  Cheryl Lawson is a 64 y.o. female who presents today for an acute visit.  This is a new problem. Associated symptoms of congestion, drainage, productive cough, chills, and body aches has been going on for a week and half. Has tried OTC mucinex- cold and flu which helped minimally.Denies any recent antibiotic use.    No Known Allergies   Current Outpatient Prescriptions on File Prior to Visit  Medication Sig Dispense Refill  . ALPRAZolam (XANAX) 0.25 MG tablet Take 1 tablet (0.25 mg total) by mouth at bedtime as needed for anxiety. (Patient taking differently: Take 0.25 mg by mouth 2 (two) times daily as needed for anxiety. ) 30 tablet 1  . mirtazapine (REMERON) 15 MG tablet Take 1 tablet (15 mg total) by mouth at bedtime. (Patient taking differently: Take 30 mg by mouth at bedtime. ) 30 tablet 2  . Multiple Vitamin (MULTIVITAMIN WITH MINERALS) TABS tablet Take 1 tablet by mouth daily.     No current facility-administered medications on file prior to visit.    Review of Systems  Constitutional: Positive for chills.  HENT: Positive for congestion, ear pain and sinus pressure. Negative for sore throat.   Respiratory: Positive for cough. Negative for chest tightness, shortness of breath and wheezing.   Neurological: Positive for headaches.      Objective:    BP 110/72 mmHg  Pulse 90  Temp(Src) 98.4 F (36.9 C) (Oral)  Resp 18  Ht 5\' 5"  (1.651 m)  Wt 108 lb 12.8 oz (49.351 kg)  BMI 18.11 kg/m2  SpO2 97% Nursing note and vital signs reviewed.  Physical Exam  Constitutional: She is oriented to person, place, and time. She appears well-developed and well-nourished. No distress.  HENT:  Right Ear: Hearing, tympanic membrane,  external ear and ear canal normal.  Left Ear: Hearing, tympanic membrane, external ear and ear canal normal.  Mouth/Throat: Uvula is midline, oropharynx is clear and moist and mucous membranes are normal.  Neck: Neck supple.  Cardiovascular: Normal rate, regular rhythm, normal heart sounds and intact distal pulses.   Pulmonary/Chest: Effort normal and breath sounds normal.  Lymphadenopathy:    She has no cervical adenopathy.  Neurological: She is alert and oriented to person, place, and time.  Skin: Skin is warm and dry.  Psychiatric: She has a normal mood and affect. Her behavior is normal. Judgment and thought content normal.       Assessment & Plan:

## 2014-08-23 NOTE — Patient Instructions (Addendum)
Thank you for choosing [facility].  Summary/Instructions:  Your prescription(s) have been submitted to your pharmacy or been printed and provided for you. Please take as directed and contact our office if you believe you are having problem(s) with the medication(s) or have any questions.  If your symptoms worsen or fail to improve, please contact our office for further instruction, or in case of emergency go directly to the emergency room at the closest medical facility.   General Recommendations:    Please drink plenty of fluids.  Get plenty of rest   Sleep in humidified air  Use saline nasal sprays  Netti pot   OTC Medications:  Decongestants - helps relieve congestion   Flonase (generic fluticasone) or Nasacort (generic triamcinolone) - please make sure to use the "cross-over" technique at a 45 degree angle towards the opposite eye as opposed to straight up the nasal passageway.   Sudafed (generic pseudoephedrine - Note this is the one that is available behind the pharmacy counter); Products with phenylephrine (-PE) may also be used but is often not as effective as pseudoephedrine.   If you have HIGH BLOOD PRESSURE - Coricidin HBP; AVOID any product that is -D as this contains pseudoephedrine which may increase your blood pressure.  Afrin (oxymetazoline) every 6-8 hours for up to 3 days.   Allergies - helps relieve runny nose, itchy eyes and sneezing   Claritin (generic loratidine), Allegra (fexofenidine), or Zyrtec (generic cyrterizine) for runny nose. These medications should not cause drowsiness.  Note - Benadryl (generic diphenhydramine) may be used however may cause drowsiness  Cough -   Delsym or Robitussin (generic dextromethorphan)  Expectorants - helps loosen mucus to ease removal   Mucinex (generic guaifenesin) as directed on the package.  Headaches / General Aches   Tylenol (generic acetaminophen) - DO NOT EXCEED 3 grams (3,000 mg) in a 24  hour time period  Advil/Motrin (generic ibuprofen)   Sore Throat -   Salt water gargle   Chloraseptic (generic benzocaine) spray or lozenges / Sucrets (generic dyclonine)      

## 2014-08-23 NOTE — Progress Notes (Signed)
Pre visit review using our clinic review tool, if applicable. No additional management support is needed unless otherwise documented below in the visit note. 

## 2014-08-28 ENCOUNTER — Other Ambulatory Visit: Payer: Self-pay | Admitting: Internal Medicine

## 2014-09-06 ENCOUNTER — Other Ambulatory Visit: Payer: Self-pay | Admitting: Internal Medicine

## 2014-09-12 ENCOUNTER — Telehealth: Payer: Self-pay | Admitting: Internal Medicine

## 2014-09-12 ENCOUNTER — Other Ambulatory Visit: Payer: Self-pay | Admitting: Internal Medicine

## 2014-09-12 NOTE — Telephone Encounter (Signed)
Pt called stated that she have not got the refill for ALPRAZolam (XANAX) 0.25 MG since 06/19/2014. Please check and call pt

## 2014-09-12 NOTE — Telephone Encounter (Signed)
Called [facility] to verify if they received script on 3/16 spoke with pharmacist Rosanna she stated they did not receive. Gave md authorization from 3/16. Notified pt med has been call into pharmacy...Cheryl Lawson

## 2014-09-18 ENCOUNTER — Telehealth: Payer: Self-pay | Admitting: Internal Medicine

## 2014-09-18 DIAGNOSIS — Z01812 Encounter for preprocedural laboratory examination: Secondary | ICD-10-CM

## 2014-09-18 NOTE — Telephone Encounter (Signed)
[place] and needs some lab work done. Please call her to get details.

## 2014-09-18 NOTE — Telephone Encounter (Signed)
Left message for patient to call me back. 

## 2014-09-19 ENCOUNTER — Other Ambulatory Visit: Payer: Self-pay | Admitting: Internal Medicine

## 2014-09-19 NOTE — Telephone Encounter (Signed)
Patient needs a clearance for a nose surgery on 10/06/14. She has the form and will bring it in when she gets labs done. She needs CBC, and BMP. Would you like to order these labs? Labs should be associated with Z10.812 and Z01.818. The results should be faxed to [phone].

## 2014-09-19 NOTE — Telephone Encounter (Signed)
They are going to the doctor's office that is requesting them.

## 2014-09-19 NOTE — Telephone Encounter (Signed)
Pt returned call    Best number [phone]

## 2014-09-19 NOTE — Telephone Encounter (Signed)
Ordered the labs. Do we know if these are being faxed to a doctor's office or is it a private fax?

## 2014-09-20 ENCOUNTER — Other Ambulatory Visit (INDEPENDENT_AMBULATORY_CARE_PROVIDER_SITE_OTHER): Payer: BLUE CROSS/BLUE SHIELD

## 2014-09-20 DIAGNOSIS — Z01812 Encounter for preprocedural laboratory examination: Secondary | ICD-10-CM | POA: Diagnosis not present

## 2014-09-20 LAB — BASIC METABOLIC PANEL
BUN: 20 mg/dL (ref 6–23)
CO2: 30 mEq/L (ref 19–32)
CREATININE: 1.06 mg/dL (ref 0.40–1.20)
Calcium: 10 mg/dL (ref 8.4–10.5)
Chloride: 102 mEq/L (ref 96–112)
GFR: 55.93 mL/min — ABNORMAL LOW (ref >60.00)
Glucose, Bld: 90 mg/dL (ref 70–99)
Potassium: 5 mEq/L (ref 3.5–5.1)
SODIUM: 136 meq/L (ref 135–145)

## 2014-09-20 LAB — CBC
HCT: 45.1 % (ref 36.0–46.0)
Hemoglobin: 15.5 g/dL — ABNORMAL HIGH (ref 12.0–15.0)
MCHC: 34.4 g/dL (ref 30.0–36.0)
MCV: 87.3 fl (ref 78.0–100.0)
Platelets: 185 10*3/uL (ref 150.0–400.0)
RBC: 5.17 Mil/uL — ABNORMAL HIGH (ref 3.87–5.11)
RDW: 13.5 % (ref 11.5–15.5)
WBC: 4.8 10*3/uL (ref 4.0–10.5)

## 2014-09-20 NOTE — Telephone Encounter (Signed)
Fax number [phone] Dr Santo Held in [place]   She just something, it can even be on a script pad just stating it is ok for her to have the surgery   Mora Appl. Leitha Bleak, MD  Address: [address], [place] [address]  [phone]   Results of blood work also has to be faxed to this same number

## 2014-09-22 NOTE — Telephone Encounter (Signed)
Faxed labs to Psychiatric nurse in [place].

## 2014-09-27 NOTE — Telephone Encounter (Signed)
Patient is requesting that a medical clearance note be sent to fax # [phone] for the blow operation. Dr Diona Fanti

## 2014-09-28 ENCOUNTER — Other Ambulatory Visit: Payer: Self-pay | Admitting: Internal Medicine

## 2014-09-28 NOTE — Telephone Encounter (Signed)
Faxed letter to Dr. Santo Held in [place].

## 2015-01-11 ENCOUNTER — Encounter: Payer: Self-pay | Admitting: *Deleted

## 2015-01-21 ENCOUNTER — Emergency Department (HOSPITAL_BASED_OUTPATIENT_CLINIC_OR_DEPARTMENT_OTHER)
Admission: EM | Admit: 2015-01-21 | Discharge: 2015-01-21 | Disposition: A | Discharge status: Home / Self Care | Payer: 59 | Attending: Emergency Medicine | Admitting: Emergency Medicine

## 2015-01-21 ENCOUNTER — Encounter (HOSPITAL_BASED_OUTPATIENT_CLINIC_OR_DEPARTMENT_OTHER): Payer: Self-pay | Admitting: *Deleted

## 2015-01-21 ENCOUNTER — Emergency Department (HOSPITAL_BASED_OUTPATIENT_CLINIC_OR_DEPARTMENT_OTHER): Payer: 59

## 2015-01-21 DIAGNOSIS — R109 Unspecified abdominal pain: Secondary | ICD-10-CM

## 2015-01-21 DIAGNOSIS — Z792 Long term (current) use of antibiotics: Secondary | ICD-10-CM | POA: Insufficient documentation

## 2015-01-21 DIAGNOSIS — R1031 Right lower quadrant pain: Secondary | ICD-10-CM | POA: Insufficient documentation

## 2015-01-21 DIAGNOSIS — R531 Weakness: Secondary | ICD-10-CM | POA: Insufficient documentation

## 2015-01-21 DIAGNOSIS — R6883 Chills (without fever): Secondary | ICD-10-CM | POA: Diagnosis not present

## 2015-01-21 DIAGNOSIS — R112 Nausea with vomiting, unspecified: Secondary | ICD-10-CM | POA: Diagnosis not present

## 2015-01-21 DIAGNOSIS — R251 Tremor, unspecified: Secondary | ICD-10-CM | POA: Insufficient documentation

## 2015-01-21 DIAGNOSIS — Z79899 Other long term (current) drug therapy: Secondary | ICD-10-CM | POA: Diagnosis not present

## 2015-01-21 DIAGNOSIS — Z853 Personal history of malignant neoplasm of breast: Secondary | ICD-10-CM | POA: Diagnosis not present

## 2015-01-21 DIAGNOSIS — R51 Headache: Secondary | ICD-10-CM | POA: Insufficient documentation

## 2015-01-21 DIAGNOSIS — F329 Major depressive disorder, single episode, unspecified: Secondary | ICD-10-CM | POA: Diagnosis not present

## 2015-01-21 DIAGNOSIS — F419 Anxiety disorder, unspecified: Secondary | ICD-10-CM | POA: Insufficient documentation

## 2015-01-21 LAB — COMPREHENSIVE METABOLIC PANEL
ALT: 20 U/L (ref 14–54)
ANION GAP: 9 (ref 5–15)
AST: 41 U/L (ref 15–41)
Albumin: 4.4 g/dL (ref 3.5–5.0)
Alkaline Phosphatase: 49 U/L (ref 38–126)
BUN: 14 mg/dL (ref 6–20)
CALCIUM: 9.3 mg/dL (ref 8.9–10.3)
CO2: 25 mmol/L (ref 22–32)
Chloride: 104 mmol/L (ref 101–111)
Creatinine, Ser: 0.71 mg/dL (ref 0.44–1.00)
GFR calc Af Amer: >60 mL/min (ref >60)
Glucose, Bld: 104 mg/dL — ABNORMAL HIGH (ref 65–99)
POTASSIUM: 3.8 mmol/L (ref 3.5–5.1)
Sodium: 138 mmol/L (ref 135–145)
Total Bilirubin: 1 mg/dL (ref 0.3–1.2)
Total Protein: 6.7 g/dL (ref 6.5–8.1)

## 2015-01-21 LAB — CBC WITH DIFFERENTIAL/PLATELET
Basophils Absolute: 0 10*3/uL (ref 0.0–0.1)
Basophils Relative: 0 % (ref 0–1)
EOS ABS: 0 10*3/uL (ref 0.0–0.7)
Eosinophils Relative: 0 % (ref 0–5)
HCT: 42.1 % (ref 36.0–46.0)
Hemoglobin: 14.4 g/dL (ref 12.0–15.0)
Lymphocytes Relative: 12 % (ref 12–46)
Lymphs Abs: 0.8 10*3/uL (ref 0.7–4.0)
MCH: 30.2 pg (ref 26.0–34.0)
MCHC: 34.2 g/dL (ref 30.0–36.0)
MCV: 88.3 fL (ref 78.0–100.0)
MONO ABS: 0.5 10*3/uL (ref 0.1–1.0)
Monocytes Relative: 7 % (ref 3–12)
NEUTROS ABS: 5.3 10*3/uL (ref 1.7–7.7)
Neutrophils Relative %: 81 % — ABNORMAL HIGH (ref 43–77)
Platelets: 173 10*3/uL (ref 150–400)
RBC: 4.77 MIL/uL (ref 3.87–5.11)
RDW: 13.1 % (ref 11.5–15.5)
WBC: 6.6 10*3/uL (ref 4.0–10.5)

## 2015-01-21 LAB — URINALYSIS, ROUTINE W REFLEX MICROSCOPIC
Bilirubin Urine: NEGATIVE
Glucose, UA: NEGATIVE mg/dL
Hgb urine dipstick: NEGATIVE
Ketones, ur: 40 mg/dL — AB
Leukocytes, UA: NEGATIVE
NITRITE: NEGATIVE
PH: 6.5 (ref 5.0–8.0)
Protein, ur: NEGATIVE mg/dL
Specific Gravity, Urine: 1.015 (ref 1.005–1.030)
UROBILINOGEN UA: 0.2 mg/dL (ref 0.0–1.0)

## 2015-01-21 LAB — LIPASE, BLOOD: Lipase: 19 U/L — ABNORMAL LOW (ref 22–51)

## 2015-01-21 MED ORDER — PROMETHAZINE HCL 25 MG PO TABS: 25.0000 mg | ORAL_TABLET | Freq: Four times a day (QID) | ORAL | Status: DC | PRN

## 2015-01-21 MED ORDER — IOHEXOL 300 MG/ML  SOLN
100.0000 mL | Freq: Once | INTRAMUSCULAR | Status: AC | PRN
  Administered 2015-01-21: 100 mL via INTRAVENOUS

## 2015-01-21 MED ORDER — PROMETHAZINE HCL 25 MG/ML IJ SOLN
12.5000 mg | Freq: Once | INTRAMUSCULAR | Status: AC
  Administered 2015-01-21: 12.5 mg via INTRAVENOUS
  Filled 2015-01-21: qty 1

## 2015-01-21 MED ORDER — ONDANSETRON HCL 4 MG/2ML IJ SOLN
4.0000 mg | Freq: Once | INTRAMUSCULAR | Status: AC
  Administered 2015-01-21: 4 mg via INTRAVENOUS
  Filled 2015-01-21: qty 2

## 2015-01-21 MED ORDER — SODIUM CHLORIDE 0.9 % IV BOLUS (SEPSIS)
500.0000 mL | Freq: Once | INTRAVENOUS | Status: AC
  Administered 2015-01-21: 500 mL via INTRAVENOUS

## 2015-01-21 MED ORDER — FENTANYL CITRATE (PF) 100 MCG/2ML IJ SOLN
50.0000 ug | Freq: Once | INTRAMUSCULAR | Status: AC
  Administered 2015-01-21: 50 ug via INTRAVENOUS
  Filled 2015-01-21: qty 2

## 2015-01-21 MED ORDER — IOHEXOL 300 MG/ML  SOLN
50.0000 mL | Freq: Once | INTRAMUSCULAR | Status: AC | PRN
Start: 2015-01-21 — End: 2015-01-21
  Administered 2015-01-21: 50 mL via ORAL

## 2015-01-21 NOTE — Discharge Instructions (Signed)
Abdominal Pain, Women °Abdominal (stomach, pelvic, or belly) pain can be caused by many things. It is important to tell your doctor: °· The location of the pain. °· Does it come and go or is it present all the time? °· Are there things that start the pain (eating certain foods, exercise)? °· Are there other symptoms associated with the pain (fever, nausea, vomiting, diarrhea)? °All of this is helpful to know when trying to find the cause of the pain. °CAUSES  °· Stomach: virus or bacteria infection, or ulcer. °· Intestine: appendicitis (inflamed appendix), regional ileitis (Crohn's disease), ulcerative colitis (inflamed colon), irritable bowel syndrome, diverticulitis (inflamed diverticulum of the colon), or cancer of the stomach or intestine. °· Gallbladder disease or stones in the gallbladder. °· Kidney disease, kidney stones, or infection. °· Pancreas infection or cancer. °· Fibromyalgia (pain disorder). °· Diseases of the female organs: °· Uterus: fibroid (non-cancerous) tumors or infection. °· Fallopian tubes: infection or tubal pregnancy. °· Ovary: cysts or tumors. °· Pelvic adhesions (scar tissue). °· Endometriosis (uterus lining tissue growing in the pelvis and on the pelvic organs). °· Pelvic congestion syndrome (female organs filling up with blood just before the menstrual period). °· Pain with the menstrual period. °· Pain with ovulation (producing an egg). °· Pain with an IUD (intrauterine device, birth control) in the uterus. °· Cancer of the female organs. °· Functional pain (pain not caused by a disease, may improve without treatment). °· Psychological pain. °· Depression. °DIAGNOSIS  °Your doctor will decide the seriousness of your pain by doing an examination. °· Blood tests. °· X-rays. °· Ultrasound. °· CT scan (computed tomography, special type of X-ray). °· MRI (magnetic resonance imaging). °· Cultures, for infection. °· Barium enema (dye inserted in the large intestine, to better view it with  X-rays). °· Colonoscopy (looking in intestine with a lighted tube). °· Laparoscopy (minor surgery, looking in abdomen with a lighted tube). °· Major abdominal exploratory surgery (looking in abdomen with a large incision). °TREATMENT  °The treatment will depend on the cause of the pain.  °· Many cases can be observed and treated at home. °· Over-the-counter medicines recommended by your caregiver. °· Prescription medicine. °· Antibiotics, for infection. °· Birth control pills, for painful periods or for ovulation pain. °· Hormone treatment, for endometriosis. °· Nerve blocking injections. °· Physical therapy. °· Antidepressants. °· Counseling with a psychologist or psychiatrist. °· Minor or major surgery. °HOME CARE INSTRUCTIONS  °· Do not take laxatives, unless directed by your caregiver. °· Take over-the-counter pain medicine only if ordered by your caregiver. Do not take aspirin because it can cause an upset stomach or bleeding. °· Try a clear liquid diet (broth or water) as ordered by your caregiver. Slowly move to a bland diet, as tolerated, if the pain is related to the stomach or intestine. °· Have a thermometer and take your temperature several times a day, and record it. °· Bed rest and sleep, if it helps the pain. °· Avoid sexual intercourse, if it causes pain. °· Avoid stressful situations. °· Keep your follow-up appointments and tests, as your caregiver orders. °· If the pain does not go away with medicine or surgery, you may try: °· Acupuncture. °· Relaxation exercises (yoga, meditation). °· Group therapy. °· Counseling. °SEEK MEDICAL CARE IF:  °· You notice certain foods cause stomach pain. °· Your home care treatment is not helping your pain. °· You need stronger pain medicine. °· You want your IUD removed. °· You feel faint or   lightheaded. °· You develop nausea and vomiting. °· You develop a rash. °· You are having side effects or an allergy to your medicine. °SEEK IMMEDIATE MEDICAL CARE IF:  °· Your  pain does not go away or gets worse. °· You have a fever. °· Your pain is felt only in portions of the abdomen. The right side could possibly be appendicitis. The left lower portion of the abdomen could be colitis or diverticulitis. °· You are passing blood in your stools (bright red or black tarry stools, with or without vomiting). °· You have blood in your urine. °· You develop chills, with or without a fever. °· You pass out. °MAKE SURE YOU:  °· Understand these instructions. °· Will watch your condition. °· Will get help right away if you are not doing well or get worse. °Document Released: 03/30/2007 Document Revised: 10/17/2013 Document Reviewed: 04/19/2009 °[employer]® Patient Information ©2015 [employer]. This information is not intended to replace advice given to you by your health care provider. Make sure you discuss any questions you have with your health care provider. ° °Nausea and Vomiting °Nausea is a sick feeling that often comes before throwing up (vomiting). Vomiting is a reflex where stomach contents come out of your mouth. Vomiting can cause severe loss of body fluids (dehydration). Children and elderly adults can become dehydrated quickly, especially if they also have diarrhea. Nausea and vomiting are symptoms of a condition or disease. It is important to find the cause of your symptoms. °CAUSES  °· Direct irritation of the stomach lining. This irritation can result from increased acid production (gastroesophageal reflux disease), infection, food poisoning, taking certain medicines (such as nonsteroidal anti-inflammatory drugs), alcohol use, or tobacco use. °· Signals from the brain. These signals could be caused by a headache, heat exposure, an inner ear disturbance, increased pressure in the brain from injury, infection, a tumor, or a concussion, pain, emotional stimulus, or metabolic problems. °· An obstruction in the gastrointestinal tract (bowel obstruction). °· Illnesses such as diabetes,  hepatitis, gallbladder problems, appendicitis, kidney problems, cancer, sepsis, atypical symptoms of a heart attack, or eating disorders. °· Medical treatments such as chemotherapy and radiation. °· Receiving medicine that makes you sleep (general anesthetic) during surgery. °DIAGNOSIS °Your caregiver may ask for tests to be done if the problems do not improve after a few days. Tests may also be done if symptoms are severe or if the reason for the nausea and vomiting is not clear. Tests may include: °· Urine tests. °· Blood tests. °· Stool tests. °· Cultures (to look for evidence of infection). °· X-rays or other imaging studies. °Test results can help your caregiver make decisions about treatment or the need for additional tests. °TREATMENT °You need to stay well hydrated. Drink frequently but in small amounts. You may wish to drink water, sports drinks, clear broth, or eat frozen ice pops or gelatin dessert to help stay hydrated. When you eat, eating slowly may help prevent nausea. There are also some antinausea medicines that may help prevent nausea. °HOME CARE INSTRUCTIONS  °· Take all medicine as directed by your caregiver. °· If you do not have an appetite, do not force yourself to eat. However, you must continue to drink fluids. °· If you have an appetite, eat a normal diet unless your caregiver tells you differently. °¨ Eat a variety of complex carbohydrates (rice, wheat, potatoes, bread), lean meats, yogurt, fruits, and vegetables. °¨ Avoid high-fat foods because they are more difficult to digest. °· Drink enough water and fluids   to keep your urine clear or pale yellow. °· If you are dehydrated, ask your caregiver for specific rehydration instructions. Signs of dehydration may include: °¨ Severe thirst. °¨ Dry lips and mouth. °¨ Dizziness. °¨ Dark urine. °¨ Decreasing urine frequency and amount. °¨ Confusion. °¨ Rapid breathing or pulse. °SEEK IMMEDIATE MEDICAL CARE IF:  °· You have blood or brown flecks  (like coffee grounds) in your vomit. °· You have black or bloody stools. °· You have a severe headache or stiff neck. °· You are confused. °· You have severe abdominal pain. °· You have chest pain or trouble breathing. °· You do not urinate at least once every 8 hours. °· You develop cold or clammy skin. °· You continue to vomit for longer than 24 to 48 hours. °· You have a fever. °MAKE SURE YOU:  °· Understand these instructions. °· Will watch your condition. °· Will get help right away if you are not doing well or get worse. °Document Released: 06/02/2005 Document Revised: 08/25/2011 Document Reviewed: 10/30/2010 °Exit[employer]® Patient Information ©2015 [employer]. This information is not intended to replace advice given to you by your health care provider. Make sure you discuss any questions you have with your health care provider. ° °

## 2015-01-21 NOTE — ED Provider Notes (Signed)
CSN: [id number]     Arrival date & time 01/21/15  1751 History  This chart was scribed for Cheryl Belling, MD by Helane Gunther, ED Scribe. This patient was seen in room MH03/MH03 and the patient's care was started at 6:41 PM.    Chief Complaint  Patient presents with  . Abdominal Pain   The history is provided by the patient. No language interpreter was used.   HPI Comments: Cheryl Lawson is a 64 y.o. female who presents to the Emergency Department complaining of worsening abdominal pain onset 2 days ago. She reports associated nausea, shaking, pounding HA, chills, and weakness, all starting this morning. She states she was at a champagne tasting last night after which she went to bed at 3 AM. She tasted 4-5 different types of champagne. She still has her appendix and has gone through menopause. Pt denies diarrhea, fever, and vaginal bleeding or discharge.  Past Medical History  Diagnosis Date  . Breast cancer   . Depression   . Anxiety    Past Surgical History  Procedure Laterality Date  . Nasal septum surgery    . Infertility surgery    . Tonsillectomy    . Breast lumpectomy      right  . Neck surgery      lift    Family History  Problem Relation Age of Onset  . Ovarian cancer Mother   . Colon cancer Mother    History  Substance Use Topics  . Smoking status: Never Smoker   . Smokeless tobacco: Never Used  . Alcohol Use: Yes     Comment: rare   OB History    No data available     Review of Systems  Constitutional: Positive for chills.  Gastrointestinal: Positive for nausea and abdominal pain.  Neurological: Positive for weakness and headaches.  All other systems reviewed and are negative.   Allergies  Review of patient's allergies indicates no known allergies.  Home Medications   Prior to Admission medications   Medication Sig Start Date End Date Taking? Authorizing Provider  Cyanocobalamin (B-12 PO) Take by mouth.   Yes Historical Provider, MD   ALPRAZolam Duanne Moron) 0.25 MG tablet TAKE 1 TABLET BY MOUTH AT BEDTIME AS NEEDED FOR ANXIETY 08/30/14   Olga Millers, MD  amoxicillin-clavulanate (AUGMENTIN) 875-125 MG per tablet Take 1 tablet by mouth 2 (two) times daily. 08/23/14   Golden Circle, FNP  mirtazapine (REMERON) 15 MG tablet Take 1 tablet (15 mg total) by mouth at bedtime. Patient taking differently: Take 30 mg by mouth at bedtime.  03/10/14   Rosalita Chessman, DO  Multiple Vitamin (MULTIVITAMIN WITH MINERALS) TABS tablet Take 1 tablet by mouth daily. 02/24/14   Patrecia Pour, NP  promethazine (PHENERGAN) 25 MG tablet Take 1 tablet (25 mg total) by mouth every 6 (six) hours as needed for nausea. 01/21/15   Cheryl Belling, MD   BP 124/78 mmHg  Pulse 79  Temp(Src) 98.1 F (36.7 C) (Oral)  Resp 18  Ht 5\' 6"  (1.676 m)  Wt 109 lb (49.442 kg)  BMI 17.60 kg/m2  SpO2 96% Physical Exam  Constitutional: She is oriented to person, place, and time. She appears well-developed and well-nourished. No distress.  She appears unwell  HENT:  Head: Normocephalic and atraumatic.  Mouth/Throat: Oropharynx is clear and moist.  Swelling of the lips  Eyes: Conjunctivae and EOM are normal. Pupils are equal, round, and reactive to light.  Neck: Normal range of motion. Neck  supple. No tracheal deviation present.  Cardiovascular: Normal rate.   Pulmonary/Chest: Effort normal and breath sounds normal. No respiratory distress.  Abdominal: Soft. She exhibits no mass. There is tenderness.  RLQ TTP, no hernias, no masses.  Musculoskeletal: Normal range of motion.  No CVA tenderness  Neurological: She is alert and oriented to person, place, and time.  Skin: Skin is warm and dry.  Psychiatric: She has a normal mood and affect. Her behavior is normal.  Nursing note and vitals reviewed.   ED Course  Procedures  DIAGNOSTIC STUDIES: Oxygen Saturation is 96% on RA, adequate by my interpretation.    COORDINATION OF CARE: 6:44 PM - Discussed plans to  order IV fluids and diagnostic studies. Pt advised of plan for treatment and pt agrees.  Labs Review Labs Reviewed  COMPREHENSIVE METABOLIC PANEL - Abnormal; Notable for the following:    Glucose, Bld 104 (*)    All other components within normal limits  LIPASE, BLOOD - Abnormal; Notable for the following:    Lipase 19 (*)    All other components within normal limits  CBC WITH DIFFERENTIAL/PLATELET - Abnormal; Notable for the following:    Neutrophils Relative % 81 (*)    All other components within normal limits  URINALYSIS, ROUTINE W REFLEX MICROSCOPIC (NOT AT [facility]) - Abnormal; Notable for the following:    Ketones, ur 40 (*)    All other components within normal limits    Imaging Review Ct Abdomen Pelvis W Contrast  01/21/2015   CLINICAL DATA:  Acute onset of worsening abdominal pain for 2 days. Nausea, shaking, chills and weakness. Pounding headache. Initial encounter.  EXAM: CT ABDOMEN AND PELVIS WITH CONTRAST  TECHNIQUE: Multidetector CT imaging of the abdomen and pelvis was performed using the standard protocol following bolus administration of intravenous contrast.  CONTRAST:  173mL OMNIPAQUE IOHEXOL 300 MG/ML  SOLN  COMPARISON:  Lumbar spine radiographs performed 04/06/2012  FINDINGS: The visualized lung bases are clear.  Scattered small hypodensities within the liver measure up to 6 mm in size, nonspecific but possibly reflecting small cysts. The spleen is unremarkable in appearance. The gallbladder is within normal limits. The pancreas and adrenal glands are unremarkable.  A small 7 mm right renal cyst is noted. The kidneys are otherwise unremarkable. There is no evidence of hydronephrosis. No renal or ureteral stones are seen. No perinephric stranding is appreciated.  No free fluid is identified. The small bowel is unremarkable in appearance. The stomach is within normal limits. No acute vascular abnormalities are seen.  The appendix is normal in caliber and contains air, without  evidence for appendicitis. The colon is unremarkable in appearance.  The bladder is mildly distended and grossly unremarkable. Multiple uterine fibroids are noted, with dense calcification. The ovaries are grossly symmetric. No suspicious adnexal masses are seen. No inguinal lymphadenopathy is seen.  No acute osseous abnormalities are identified.  IMPRESSION: 1. No acute abnormality seen to explain the patient's symptoms. 2. Multiple uterine fibroids, with dense calcification. 3. Scattered nonspecific hypodensities within the liver may reflect small cysts. 4. Tiny right renal cyst noted.   Electronically Signed   By: Garald Balding M.D.   On: 01/21/2015 23:22     EKG Interpretation None      MDM   Final diagnoses:  Abdominal pain, unspecified abdominal location  Non-intractable vomiting with nausea, vomiting of unspecified type    Patient with nausea and vomiting. Right-sided abdominal pain. Some tenderness. States she's had some chills. CT scan and  lab work overall reassuring. Patient feels much better after treatment. Will discharge home.  I personally performed the services described in this documentation, which was scribed in my presence. The recorded information has been reviewed and is accurate.    Cheryl Belling, MD 01/21/15 [phone]

## 2015-01-21 NOTE — ED Notes (Addendum)
Patient c/o nausea/abd pain/shaking. Patient states that she was at a champagne tasting event last nigh and possibly drank more than she thinks she should, went to bed around 3am, has felt nauseous and jittery since she got up this morning. Patient states that she stopped taking her remeron 3 weeks ago and felt she could drink at this time.

## 2015-01-21 NOTE — ED Notes (Signed)
Pt placed on cont POX monitoring , safety measures in place, family at bedside, callbell within reach

## 2015-04-15 ENCOUNTER — Other Ambulatory Visit: Payer: Self-pay | Admitting: Internal Medicine

## 2015-04-25 ENCOUNTER — Encounter (HOSPITAL_BASED_OUTPATIENT_CLINIC_OR_DEPARTMENT_OTHER): Payer: Self-pay

## 2015-04-25 ENCOUNTER — Emergency Department (HOSPITAL_BASED_OUTPATIENT_CLINIC_OR_DEPARTMENT_OTHER)
Admission: EM | Admit: 2015-04-25 | Discharge: 2015-04-25 | Disposition: A | Discharge status: Home / Self Care | Payer: 59 | Attending: Emergency Medicine | Admitting: Emergency Medicine

## 2015-04-25 ENCOUNTER — Emergency Department (HOSPITAL_BASED_OUTPATIENT_CLINIC_OR_DEPARTMENT_OTHER): Payer: 59

## 2015-04-25 DIAGNOSIS — F329 Major depressive disorder, single episode, unspecified: Secondary | ICD-10-CM | POA: Diagnosis not present

## 2015-04-25 DIAGNOSIS — Z853 Personal history of malignant neoplasm of breast: Secondary | ICD-10-CM | POA: Insufficient documentation

## 2015-04-25 DIAGNOSIS — R51 Headache: Secondary | ICD-10-CM | POA: Diagnosis not present

## 2015-04-25 DIAGNOSIS — F419 Anxiety disorder, unspecified: Secondary | ICD-10-CM | POA: Insufficient documentation

## 2015-04-25 DIAGNOSIS — Z79899 Other long term (current) drug therapy: Secondary | ICD-10-CM | POA: Insufficient documentation

## 2015-04-25 DIAGNOSIS — R519 Headache, unspecified: Secondary | ICD-10-CM

## 2015-04-25 MED ORDER — IOHEXOL 300 MG/ML  SOLN
75.0000 mL | Freq: Once | INTRAMUSCULAR | Status: AC | PRN
  Administered 2015-04-25: 75 mL via INTRAVENOUS

## 2015-04-25 NOTE — ED Provider Notes (Signed)
CSN: [id number]     Arrival date & time 04/25/15  1856 History  By signing my name below, I, Arianna Nassar, attest that this documentation has been prepared under the direction and in the presence of Veryl Speak, MD. Electronically Signed: Julien Nordmann, ED Scribe. 04/25/2015. 7:44 PM.     Chief Complaint  Patient presents with  . Facial Pain      The history is provided by the patient. No language interpreter was used.   HPI Comments: Cheryl Lawson is a 64 y.o. female who has a PMHx of depression, anxiety and breast cancer presents to the Emergency Department complaining of constant, gradual worsening left sided facial pain onset 2 years ago. She admits to having bilateral silicon cheek implants and is unknown if that is the cause of the problem. Pt states she feels like her cheek muscle is moving up and down when she lays down and goes on a jog. Pt states she had laser done to her face and shortly after her face was swollen and that is when she began to have gradual worsening pain. Pt was seen on 10/20 in [place] and was told to have a scan done to figure out what was wrong. Pt notes this problem has affected her psychologically and she has been treated for clinical depression due to this situation. She denies neck pain, dental pain, and bilateral ear pain.  Past Medical History  Diagnosis Date  . Breast cancer ([place])   . Depression   . Anxiety    Past Surgical History  Procedure Laterality Date  . Nasal septum surgery    . Infertility surgery    . Tonsillectomy    . Breast lumpectomy      right  . Neck surgery      lift   . Facial cosmetic surgery     Family History  Problem Relation Age of Onset  . Ovarian cancer Mother   . Colon cancer Mother    Social History  Substance Use Topics  . Smoking status: Never Smoker   . Smokeless tobacco: Never Used  . Alcohol Use: Yes     Comment: rare   OB History    No data available     Review of Systems  HENT: Negative for dental  problem and ear pain.   Musculoskeletal: Negative for neck pain.  Psychiatric/Behavioral: The patient is nervous/anxious.   All other systems reviewed and are negative.     Allergies  Review of patient's allergies indicates no known allergies.  Home Medications   Prior to Admission medications   Medication Sig Start Date End Date Taking? Authorizing Provider  ALPRAZolam Duanne Moron) 0.25 MG tablet TAKE 1 TABLET BY MOUTH AT BEDTIME AS NEEDED FOR SLEEP OR ANXIETY 04/16/15   Hoyt Koch, MD  Cyanocobalamin (B-12 PO) Take by mouth.    Historical Provider, MD  Multiple Vitamin (MULTIVITAMIN WITH MINERALS) TABS tablet Take 1 tablet by mouth daily. 02/24/14   Patrecia Pour, NP   Triage vitals: BP 150/91 mmHg  Pulse 72  Temp(Src) 98.1 F (36.7 C) (Oral)  Resp 18  Ht 5\' 5"  (1.651 m)  Wt 106 lb (48.081 kg)  BMI 17.64 kg/m2  SpO2 100% Physical Exam  Constitutional: She is oriented to person, place, and time. She appears well-developed and well-nourished.  HENT:  Head: Normocephalic.  There appears to have been prior cosmetic surgeries. The cheek bones appear symmetrical with no significant swelling or appreciable abnormality.  TMs are clear bilaterally.  The oropharynx appears normal with no swelling or lesions.  Eyes: EOM are normal.  Neck: Normal range of motion.  Pulmonary/Chest: Effort normal.  Abdominal: She exhibits no distension.  Musculoskeletal: Normal range of motion.  Neurological: She is alert and oriented to person, place, and time.  Psychiatric: She has a normal mood and affect.  Nursing note and vitals reviewed.   ED Course  Procedures  DIAGNOSTIC STUDIES: Oxygen Saturation is 100% on RA, normal by my interpretation.  COORDINATION OF CARE:  7:37 PM Discussed treatment plan which includes CT of face with pt at bedside and pt agreed to plan.  Labs Review Labs Reviewed - No data to display  Imaging Review No results found. I have personally reviewed  and evaluated these images and lab results as part of my medical decision-making.   EKG Interpretation None      MDM   Final diagnoses:  None    Patient is a 64 year old female with history of bilateral cheek bone implants many years ago. She presents today for evaluation of facial pain. She is having pain in her left cheek that has been thus far unexplained. She had appointment with an outside physician who recommended a CT scan of her face be obtained. This is scheduled for several weeks down the road. She presents tonight stating that she cannot take the pain any longer and needs some answers. Her CT was performed this evening and revealed soft tissue inflammation in the cheeks, left greater than right, however no other significant abnormality. I am uncertain as to whether there is some sort of rejection of the cheek implant or some other factor causing her pain, however I will recommend she follow-up with a cosmetic surgeon to discuss her situation. I see nothing tonight that appears emergent.  I personally performed the services described in this documentation, which was scribed in my presence. The recorded information has been reviewed and is accurate.      Veryl Speak, MD 04/25/15 2100

## 2015-04-25 NOTE — ED Notes (Signed)
C/o left side face pain x 2 years

## 2015-04-25 NOTE — Discharge Instructions (Signed)
Follow-up with cosmetic surgery to discuss your situation.

## 2015-07-10 ENCOUNTER — Encounter: Payer: Self-pay | Admitting: Internal Medicine

## 2015-07-10 ENCOUNTER — Other Ambulatory Visit (INDEPENDENT_AMBULATORY_CARE_PROVIDER_SITE_OTHER): Payer: 59

## 2015-07-10 ENCOUNTER — Ambulatory Visit (INDEPENDENT_AMBULATORY_CARE_PROVIDER_SITE_OTHER): Payer: BLUE CROSS/BLUE SHIELD | Admitting: Internal Medicine

## 2015-07-10 VITALS — BP 110/70 | HR 98 | Temp 97.8°F | Resp 16 | Ht 65.5 in | Wt 107.0 lb

## 2015-07-10 DIAGNOSIS — Z01810 Encounter for preprocedural cardiovascular examination: Secondary | ICD-10-CM

## 2015-07-10 LAB — COMPREHENSIVE METABOLIC PANEL
ALT: 14 U/L (ref 0–35)
AST: 29 U/L (ref 0–37)
Albumin: 4.6 g/dL (ref 3.5–5.2)
Alkaline Phosphatase: 51 U/L (ref 39–117)
BUN: 15 mg/dL (ref 6–23)
CALCIUM: 9.7 mg/dL (ref 8.4–10.5)
CHLORIDE: 102 meq/L (ref 96–112)
CO2: 30 meq/L (ref 19–32)
CREATININE: 0.92 mg/dL (ref 0.40–1.20)
GFR: 65.69 mL/min (ref >60.00)
Glucose, Bld: 100 mg/dL — ABNORMAL HIGH (ref 70–99)
POTASSIUM: 4.6 meq/L (ref 3.5–5.1)
Sodium: 139 mEq/L (ref 135–145)
Total Bilirubin: 0.6 mg/dL (ref 0.2–1.2)
Total Protein: 7 g/dL (ref 6.0–8.3)

## 2015-07-10 LAB — TSH: TSH: 1.34 u[IU]/mL (ref 0.35–4.50)

## 2015-07-10 LAB — PROTIME-INR
INR: 1 ratio (ref 0.8–1.0)
PROTHROMBIN TIME: 10.7 s (ref 9.6–13.1)

## 2015-07-10 LAB — CBC
HCT: 44.8 % (ref 36.0–46.0)
Hemoglobin: 14.8 g/dL (ref 12.0–15.0)
MCHC: 33.1 g/dL (ref 30.0–36.0)
MCV: 90.9 fl (ref 78.0–100.0)
PLATELETS: 196 10*3/uL (ref 150.0–400.0)
RBC: 4.93 Mil/uL (ref 3.87–5.11)
RDW: 12.9 % (ref 11.5–15.5)
WBC: 4.9 10*3/uL (ref 4.0–10.5)

## 2015-07-10 LAB — T4, FREE: Free T4: 0.9 ng/dL (ref 0.60–1.60)

## 2015-07-10 LAB — APTT: APTT: 28.1 s (ref 23.4–32.7)

## 2015-07-10 NOTE — Patient Instructions (Signed)
We have checked the labs today and will send you the results.   The EKG of the heart was normal and not changed from 5 years ago.   You are okay to have the surgery.

## 2015-07-10 NOTE — Progress Notes (Signed)
Pre visit review using our clinic review tool, if applicable. No additional management support is needed unless otherwise documented below in the visit note. 

## 2015-07-11 ENCOUNTER — Encounter: Payer: Self-pay | Admitting: Internal Medicine

## 2015-07-11 DIAGNOSIS — Z01810 Encounter for preprocedural cardiovascular examination: Secondary | ICD-10-CM | POA: Insufficient documentation

## 2015-07-11 LAB — HEPATITIS C ANTIBODY: HCV Ab: NEGATIVE

## 2015-07-11 LAB — HIV ANTIBODY (ROUTINE TESTING W REFLEX): HIV: NONREACTIVE

## 2015-07-11 NOTE — Assessment & Plan Note (Signed)
At this time given the procedure she is having done, EKG normal, checking labs, METs are good. She is deemed low risk for surgical procedure.

## 2015-07-11 NOTE — Progress Notes (Signed)
   Subjective:    Patient ID: Cheryl Lawson, female    DOB: [birth year], 65 y.o.   MRN: [id number]  HPI The patient is a 65 YO female coming in for surgical clearance. She is getting a surgery on her nose as she has had another procedure on her nose which seems to have blocked the nostril and she is not able to have good air flow. She denies any complaints. NO chest pains. Able to walk up 2 flights of steps at one time without pain or SOB.    Reviewed allergies, meds, [facility], PMH, social history.   Review of Systems  Constitutional: Negative for fever, activity change, appetite change, fatigue and unexpected weight change.  HENT: Negative.   Respiratory: Negative for cough, chest tightness, shortness of breath and wheezing.   Cardiovascular: Negative for chest pain, palpitations and leg swelling.  Gastrointestinal: Negative for abdominal pain, diarrhea, constipation and abdominal distention.  Musculoskeletal: Negative for myalgias, back pain and arthralgias.  Skin: Negative.   Neurological: Negative.   Psychiatric/Behavioral: Positive for sleep disturbance and dysphoric mood. Negative for self-injury.      Objective:   Physical Exam  Constitutional: She appears well-developed and well-nourished.  HENT:  Head: Normocephalic and atraumatic.  Eyes: EOM are normal.  Neck: Normal range of motion.  Cardiovascular: Normal rate and regular rhythm.   Pulmonary/Chest: Effort normal and breath sounds normal. No respiratory distress. She has no wheezes. She has no rales.  Abdominal: Soft. Bowel sounds are normal. She exhibits no distension. There is no tenderness. There is no rebound.  Neurological: She is alert. Coordination normal.  Skin: Skin is warm and dry.   Filed Vitals:   07/10/15 1410  BP: 110/70  Pulse: 98  Temp: 97.8 F (36.6 C)  TempSrc: Oral  Resp: 16  Height: 5' 5.5" (1.664 m)  Weight: 107 lb (48.535 kg)  SpO2: 98%   EKG: rate 68, sinus, short PR mild, no ST or t wave  changes, axis normal. Not able to visualize prior from 2011 in our system but reported normal then.     Assessment & Plan:

## 2015-07-25 ENCOUNTER — Encounter: Payer: Self-pay | Admitting: Internal Medicine

## 2015-09-11 ENCOUNTER — Other Ambulatory Visit: Payer: Self-pay | Admitting: Internal Medicine

## 2015-09-12 NOTE — Telephone Encounter (Signed)
Sent to pharmacy 

## 2015-11-11 ENCOUNTER — Emergency Department (HOSPITAL_BASED_OUTPATIENT_CLINIC_OR_DEPARTMENT_OTHER)
Admission: EM | Admit: 2015-11-11 | Discharge: 2015-11-11 | Disposition: A | Discharge status: Home / Self Care | Payer: 59 | Attending: Emergency Medicine | Admitting: Emergency Medicine

## 2015-11-11 ENCOUNTER — Encounter (HOSPITAL_BASED_OUTPATIENT_CLINIC_OR_DEPARTMENT_OTHER): Payer: Self-pay

## 2015-11-11 DIAGNOSIS — Z853 Personal history of malignant neoplasm of breast: Secondary | ICD-10-CM | POA: Insufficient documentation

## 2015-11-11 DIAGNOSIS — Y929 Unspecified place or not applicable: Secondary | ICD-10-CM | POA: Insufficient documentation

## 2015-11-11 DIAGNOSIS — Y999 Unspecified external cause status: Secondary | ICD-10-CM | POA: Insufficient documentation

## 2015-11-11 DIAGNOSIS — S20369A Insect bite (nonvenomous) of unspecified front wall of thorax, initial encounter: Secondary | ICD-10-CM | POA: Insufficient documentation

## 2015-11-11 DIAGNOSIS — R110 Nausea: Secondary | ICD-10-CM | POA: Insufficient documentation

## 2015-11-11 DIAGNOSIS — R51 Headache: Secondary | ICD-10-CM | POA: Insufficient documentation

## 2015-11-11 DIAGNOSIS — W57XXXA Bitten or stung by nonvenomous insect and other nonvenomous arthropods, initial encounter: Secondary | ICD-10-CM | POA: Insufficient documentation

## 2015-11-11 DIAGNOSIS — Y939 Activity, unspecified: Secondary | ICD-10-CM | POA: Insufficient documentation

## 2015-11-11 DIAGNOSIS — R21 Rash and other nonspecific skin eruption: Secondary | ICD-10-CM | POA: Diagnosis present

## 2015-11-11 DIAGNOSIS — F329 Major depressive disorder, single episode, unspecified: Secondary | ICD-10-CM | POA: Diagnosis not present

## 2015-11-11 MED ORDER — DOXYCYCLINE HYCLATE 100 MG PO CAPS: 100.0000 mg | ORAL_CAPSULE | Freq: Two times a day (BID) | ORAL | Status: DC

## 2015-11-11 NOTE — ED Provider Notes (Signed)
CSN: [id number]     Arrival date & time 11/11/15  1937 History  By signing my name below, I, Soijett Blue, attest that this documentation has been prepared under the direction and in the presence of Malvin Johns, MD. Electronically Signed: Soijett Blue, ED Scribe. 11/11/2015. 8:11 PM.   Chief Complaint  Patient presents with  . Rash  . Insect Bite      The history is provided by the patient. No language interpreter was used.    Cheryl Lawson is a 65 y.o. female who presents to the Emergency Department complaining of rash onset today PTA. Pt notes that she pulled a tick off of her chest and she began to have a rash to her chest after removing the tick. Pt denies the area being pruritic and she is unsure of how long the tick was there. Pt denies seeing a rash anywhere on her body. Pt is having associated symptoms of color change, dull HA, and nausea. She notes that she has tried 0.5 hydrocodone and xanax with no relief of her symptoms. She denies fever, arthralgia, abdominal pain, neck pain, and any other symptoms.  Past Medical History  Diagnosis Date  . Breast cancer ([place])   . Depression   . Anxiety    Past Surgical History  Procedure Laterality Date  . Nasal septum surgery    . Infertility surgery    . Tonsillectomy    . Breast lumpectomy      right  . Neck surgery      lift   . Facial cosmetic surgery     Family History  Problem Relation Age of Onset  . Ovarian cancer Mother   . Colon cancer Mother    Social History  Substance Use Topics  . Smoking status: Never Smoker   . Smokeless tobacco: Never Used  . Alcohol Use: Yes     Comment: rare   OB History    No data available     Review of Systems  Constitutional: Positive for fatigue. Negative for fever, chills and diaphoresis.  HENT: Negative for congestion, rhinorrhea and sneezing.   Eyes: Negative.   Respiratory: Negative for cough, chest tightness and shortness of breath.   Cardiovascular: Negative for chest  pain and leg swelling.  Gastrointestinal: Positive for nausea. Negative for vomiting, abdominal pain, diarrhea and blood in stool.  Genitourinary: Negative for frequency, hematuria, flank pain and difficulty urinating.  Musculoskeletal: Negative for back pain, arthralgias and neck pain.  Skin: Positive for color change and rash.  Neurological: Positive for headaches. Negative for dizziness, speech difficulty, weakness and numbness.      Allergies  Review of patient's allergies indicates no known allergies.  Home Medications   Prior to Admission medications   Medication Sig Start Date End Date Taking? Authorizing Provider  ALPRAZolam Duanne Moron) 0.25 MG tablet TAKE 1 TABLET BY MOUTH AT BEDTIME AS NEEDED FOR SLEEP OR ANXIETY 09/12/15   Hoyt Koch, MD  Cyanocobalamin (B-12 PO) Take by mouth.    Historical Provider, MD  doxycycline (VIBRAMYCIN) 100 MG capsule Take 1 capsule (100 mg total) by mouth 2 (two) times daily. One po bid x 7 days 11/11/15   Malvin Johns, MD  Multiple Vitamin (MULTIVITAMIN WITH MINERALS) TABS tablet Take 1 tablet by mouth daily. Patient not taking: Reported on 07/10/2015 02/24/14   Patrecia Pour, NP   BP 130/84 mmHg  Pulse 73  Temp(Src) 97.7 F (36.5 C) (Oral)  Resp 16  Ht 5\' 5"  (1.651  m)  Wt 100 lb (45.36 kg)  BMI 16.64 kg/m2  SpO2 98% Physical Exam  Constitutional: She is oriented to person, place, and time. She appears well-developed and well-nourished.  HENT:  Head: Normocephalic and atraumatic.  Eyes: Pupils are equal, round, and reactive to light.  Neck: Normal range of motion. Neck supple.  No meningismus  Cardiovascular: Normal rate, regular rhythm and normal heart sounds.  Exam reveals no gallop and no friction rub.   No murmur heard. Pulmonary/Chest: Effort normal and breath sounds normal. No respiratory distress. She has no wheezes. She has no rales. She exhibits no tenderness.  Abdominal: Soft. Bowel sounds are normal. There is no  tenderness. There is no rebound and no guarding.  Musculoskeletal: Normal range of motion. She exhibits no edema.  No visible swelling or tenderness to the joints  Lymphadenopathy:    She has no cervical adenopathy.  Neurological: She is alert and oriented to person, place, and time.  Skin: Skin is warm and dry. No rash noted.  Patient has a small wound to the center of her chest overlying the sternum which appears to be where she pulled the tick off. There is also some small red splotches around this area. There is no drainage or signs of infection.  Psychiatric: She has a normal mood and affect.  Nursing note and vitals reviewed.   ED Course  Procedures (including critical care time) DIAGNOSTIC STUDIES: Oxygen Saturation is 98% on RA, nl by my interpretation.    COORDINATION OF CARE: 8:06 PM Discussed treatment plan with pt at bedside which includes abx Rx and pt agreed to plan.    Labs Review Labs Reviewed - No data to display  Imaging Review No results found.    EKG Interpretation None      MDM   Final diagnoses:  Tick bite    Patient presents after a tick bite. I feel her rash is likely reactive from the bite. However she has vague symptoms of headache fatigue and nausea. I will go ahead and treat her with doxycycline for possible tick related illness. She's otherwise well-appearing. She's afebrile. She was encouraged to follow-up with her PCP.  I personally performed the services described in this documentation, which was scribed in my presence.  The recorded information has been reviewed and considered.    Malvin Johns, MD 11/11/15 2020

## 2015-11-11 NOTE — Discharge Instructions (Signed)
Tick Bite Information Ticks are insects that attach themselves to the skin and draw blood for food. There are various types of ticks. Common types include wood ticks and deer ticks. Most ticks live in shrubs and grassy areas. Ticks can climb onto your body when you make contact with leaves or grass where the tick is waiting. The most common places on the body for ticks to attach themselves are the scalp, neck, armpits, waist, and groin. Most tick bites are harmless, but sometimes ticks carry germs that cause diseases. These germs can be spread to a person during the tick's feeding process. The chance of a disease spreading through a tick bite depends on:   The type of tick.  Time of year.   How long the tick is attached.   Geographic location.  HOW CAN YOU PREVENT TICK BITES? Take these steps to help prevent tick bites when you are outdoors:  Wear protective clothing. Long sleeves and long pants are best.   Wear white clothes so you can see ticks more easily.  Tuck your pant legs into your socks.   If walking on a trail, stay in the middle of the trail to avoid brushing against bushes.  Avoid walking through areas with long grass.  Put insect repellent on all exposed skin and along boot tops, pant legs, and sleeve cuffs.   Check clothing, hair, and skin repeatedly and before going inside.   Brush off any ticks that are not attached.  Take a shower or bath as soon as possible after being outdoors.  WHAT IS THE PROPER WAY TO REMOVE A TICK? Ticks should be removed as soon as possible to help prevent diseases caused by tick bites. 1. If latex gloves are available, put them on before trying to remove a tick.  2. Using fine-point tweezers, grasp the tick as close to the skin as possible. You may also use curved forceps or a tick removal tool. Grasp the tick as close to its head as possible. Avoid grasping the tick on its body. 3. Pull gently with steady upward pressure until  the tick lets go. Do not twist the tick or jerk it suddenly. This may break off the tick's head or mouth parts. 4. Do not squeeze or crush the tick's body. This could force disease-carrying fluids from the tick into your body.  5. After the tick is removed, wash the bite area and your hands with soap and water or other disinfectant such as alcohol. 6. Apply a small amount of antiseptic cream or ointment to the bite site.  7. Wash and disinfect any instruments that were used.  Do not try to remove a tick by applying a hot match, petroleum jelly, or fingernail polish to the tick. These methods do not work and may increase the chances of disease being spread from the tick bite.  WHEN SHOULD YOU SEEK MEDICAL CARE? Contact your health care provider if you are unable to remove a tick from your skin or if a part of the tick breaks off and is stuck in the skin.  After a tick bite, you need to be aware of signs and symptoms that could be related to diseases spread by ticks. Contact your health care provider if you develop any of the following in the days or weeks after the tick bite:  Unexplained fever.  Rash. A circular rash that appears days or weeks after the tick bite may indicate the possibility of Lyme disease. The rash may resemble   a target with a bull's-eye and may occur at a different part of your body than the tick bite.  Redness and swelling in the area of the tick bite.   Tender, swollen lymph glands.   Diarrhea.   Weight loss.   Cough.   Fatigue.   Muscle, joint, or bone pain.   Abdominal pain.   Headache.   Lethargy or a change in your level of consciousness.  Difficulty walking or moving your legs.   Numbness in the legs.   Paralysis.  Shortness of breath.   Confusion.   Repeated vomiting.    This information is not intended to replace advice given to you by your health care provider. Make sure you discuss any questions you have with your health  care provider.   Document Released: 05/30/2000 Document Revised: 06/23/2014 Document Reviewed: 11/10/2012 Elsevier Interactive Patient Education 2016 [employer] Inc[employer]  

## 2015-11-11 NOTE — ED Notes (Signed)
Pt reports pulling tick off chest earlier. Sts having rash on chest after pulling it off.

## 2016-01-14 ENCOUNTER — Other Ambulatory Visit: Payer: Self-pay | Admitting: Internal Medicine

## 2016-01-14 ENCOUNTER — Other Ambulatory Visit: Payer: Self-pay

## 2016-01-14 NOTE — Telephone Encounter (Signed)
Faxed to pharm 

## 2016-01-14 NOTE — Telephone Encounter (Signed)
Faxed xanax rx to [facility] [address]

## 2016-01-14 NOTE — Telephone Encounter (Signed)
Please advise, thanks.

## 2016-04-27 ENCOUNTER — Other Ambulatory Visit: Payer: Self-pay | Admitting: Internal Medicine

## 2016-04-28 NOTE — Telephone Encounter (Signed)
Sent to pharmacy 

## 2016-07-02 ENCOUNTER — Emergency Department (HOSPITAL_BASED_OUTPATIENT_CLINIC_OR_DEPARTMENT_OTHER)
Admission: EM | Admit: 2016-07-02 | Discharge: 2016-07-02 | Disposition: A | Discharge status: Home / Self Care | Payer: BLUE CROSS/BLUE SHIELD | Attending: Emergency Medicine | Admitting: Emergency Medicine

## 2016-07-02 ENCOUNTER — Encounter (HOSPITAL_BASED_OUTPATIENT_CLINIC_OR_DEPARTMENT_OTHER): Payer: Self-pay | Admitting: Emergency Medicine

## 2016-07-02 ENCOUNTER — Emergency Department (HOSPITAL_BASED_OUTPATIENT_CLINIC_OR_DEPARTMENT_OTHER): Payer: BLUE CROSS/BLUE SHIELD

## 2016-07-02 DIAGNOSIS — Z79899 Other long term (current) drug therapy: Secondary | ICD-10-CM | POA: Diagnosis not present

## 2016-07-02 DIAGNOSIS — R110 Nausea: Secondary | ICD-10-CM

## 2016-07-02 DIAGNOSIS — R05 Cough: Secondary | ICD-10-CM | POA: Diagnosis not present

## 2016-07-02 DIAGNOSIS — Z853 Personal history of malignant neoplasm of breast: Secondary | ICD-10-CM | POA: Diagnosis not present

## 2016-07-02 DIAGNOSIS — R112 Nausea with vomiting, unspecified: Secondary | ICD-10-CM

## 2016-07-02 DIAGNOSIS — R059 Cough, unspecified: Secondary | ICD-10-CM

## 2016-07-02 LAB — CBC WITH DIFFERENTIAL/PLATELET
BASOS PCT: 0 %
Basophils Absolute: 0 10*3/uL (ref 0.0–0.1)
EOS ABS: 0 10*3/uL (ref 0.0–0.7)
EOS PCT: 0 %
HCT: 42.9 % (ref 36.0–46.0)
HEMOGLOBIN: 15 g/dL (ref 12.0–15.0)
LYMPHS ABS: 0.5 10*3/uL — AB (ref 0.7–4.0)
Lymphocytes Relative: 6 %
MCH: 30.9 pg (ref 26.0–34.0)
MCHC: 35 g/dL (ref 30.0–36.0)
MCV: 88.5 fL (ref 78.0–100.0)
Monocytes Absolute: 0.3 10*3/uL (ref 0.1–1.0)
Monocytes Relative: 3 %
NEUTROS PCT: 91 %
Neutro Abs: 7.3 10*3/uL (ref 1.7–7.7)
PLATELETS: 218 10*3/uL (ref 150–400)
RBC: 4.85 MIL/uL (ref 3.87–5.11)
RDW: 12.7 % (ref 11.5–15.5)
WBC: 8.1 10*3/uL (ref 4.0–10.5)

## 2016-07-02 LAB — COMPREHENSIVE METABOLIC PANEL
ALBUMIN: 3.9 g/dL (ref 3.5–5.0)
ALK PHOS: 47 U/L (ref 38–126)
ALT: 26 U/L (ref 14–54)
AST: 45 U/L — ABNORMAL HIGH (ref 15–41)
Anion gap: 7 (ref 5–15)
BUN: 16 mg/dL (ref 6–20)
CO2: 24 mmol/L (ref 22–32)
CREATININE: 0.72 mg/dL (ref 0.44–1.00)
Calcium: 8.6 mg/dL — ABNORMAL LOW (ref 8.9–10.3)
Chloride: 105 mmol/L (ref 101–111)
GFR calc non Af Amer: >60 mL/min (ref >60)
GLUCOSE: 110 mg/dL — AB (ref 65–99)
Potassium: 4.2 mmol/L (ref 3.5–5.1)
SODIUM: 136 mmol/L (ref 135–145)
Total Bilirubin: 0.7 mg/dL (ref 0.3–1.2)
Total Protein: 6 g/dL — ABNORMAL LOW (ref 6.5–8.1)

## 2016-07-02 LAB — URINALYSIS, ROUTINE W REFLEX MICROSCOPIC
Bilirubin Urine: NEGATIVE
Glucose, UA: NEGATIVE mg/dL
Hgb urine dipstick: NEGATIVE
Ketones, ur: 15 mg/dL — AB
Leukocytes, UA: NEGATIVE
Nitrite: NEGATIVE
Protein, ur: NEGATIVE mg/dL
Specific Gravity, Urine: 1.019 (ref 1.005–1.030)
pH: 8 (ref 5.0–8.0)

## 2016-07-02 LAB — LIPASE, BLOOD: Lipase: 28 U/L (ref 11–51)

## 2016-07-02 MED ORDER — ONDANSETRON 4 MG PO TBDP: 4.0000 mg | ORAL_TABLET | Freq: Three times a day (TID) | ORAL | 0 refills | Status: DC | PRN

## 2016-07-02 MED ORDER — SODIUM CHLORIDE 0.9 % IV BOLUS (SEPSIS)
1000.0000 mL | Freq: Once | INTRAVENOUS | Status: AC
  Administered 2016-07-02: 1000 mL via INTRAVENOUS

## 2016-07-02 NOTE — ED Provider Notes (Signed)
Emergency Department Provider Note   I have reviewed the triage vital signs and the nursing notes.   HISTORY  Chief Complaint Nausea   HPI Cheryl Lawson is a 66 y.o. female with PMH of anxiety, breast cancer, and depression resents to the emergency department for evaluation of coughing for the past 2 weeks with new-onset nausea and vomiting today. Patient states that her husband encouraged her to present to the emergency department after her vomiting became more severe. She denies any focal abdominal pain. Denies back pain. No vaginal bleeding or discharge no fevers or chills. The patient has had persistent cough for the past 2 weeks that has not improved. Denies chest pain. No exacerbating or alleviating factors. No medication tried at home.   Past Medical History:  Diagnosis Date  . Anxiety   . Breast cancer ([place])   . Depression     Patient Active Problem List   Diagnosis Date Noted  . Pre-operative cardiovascular examination 07/11/2015  . Cough 08/23/2014  . Nodule of left external ear 05/31/2014  . Hashimoto's disease 11/08/2013  . Depression with anxiety 07/07/2013  . Loss of weight 12/27/2012  . Right thyroid nodule 12/27/2012  . OSTEOPOROSIS 10/09/2009  . GRIEF REACTION, ACUTE 10/25/2007  . ADENOCARCINOMA, BREAST, HX OF 10/06/2007    Past Surgical History:  Procedure Laterality Date  . BREAST LUMPECTOMY     right  . FACIAL COSMETIC SURGERY    . infertility surgery    . NASAL SEPTUM SURGERY    . NECK SURGERY     lift   . TONSILLECTOMY      Current Outpatient Rx  . Order #: [id number] Class: Print  . Order #: [id number] Class: Historical Med  . Order #: [id number] Class: Print  . Order #: [id number] Class: No Print  . Order #: [id number] Class: Print    Allergies Patient has no known allergies.  Family History  Problem Relation Age of Onset  . Ovarian cancer Mother   . Colon cancer Mother     Social History Social History  Substance Use Topics  .  Smoking status: Never Smoker  . Smokeless tobacco: Never Used  . Alcohol use Yes     Comment: rare    Review of Systems  Constitutional: No fever/chills Eyes: No visual changes. ENT: No sore throat. Cardiovascular: Denies chest pain. Respiratory: Denies shortness of breath. Positive cough.  Gastrointestinal: No abdominal pain. Positive nausea and vomiting. No diarrhea. No constipation. Genitourinary: Negative for dysuria. Musculoskeletal: Negative for back pain. Skin: Negative for rash. Neurological: Negative for headaches, focal weakness or numbness.  10-point ROS otherwise negative.  ____________________________________________   PHYSICAL EXAM:  VITAL SIGNS: Temp: 98.7 F Pulse: 80 BP: 114/87 Resp: 20 SpO2: 100% RA  Constitutional: Alert and oriented. Well appearing and in no acute distress. Notably very thin.  Eyes: Conjunctivae are normal. Head: Atraumatic. Nose: No congestion/rhinnorhea. Mouth/Throat: Mucous membranes are moist.  Oropharynx non-erythematous. Neck: No stridor.  Cardiovascular: Normal rate, regular rhythm. Good peripheral circulation. Grossly normal heart sounds.   Respiratory: Normal respiratory effort.  No retractions. Lungs CTAB. Gastrointestinal: Soft and nontender. No distention.  Musculoskeletal: No lower extremity tenderness nor edema. No gross deformities of extremities. Neurologic:  Normal speech and language. No gross focal neurologic deficits are appreciated.  Skin:  Skin is warm, dry and intact. No rash noted.  ____________________________________________   LABS (all labs ordered are listed, but only abnormal results are displayed)  Labs Reviewed  URINALYSIS, ROUTINE W REFLEX MICROSCOPIC - Abnormal; Notable for  the following:       Result Value   APPearance CLOUDY (*)    Ketones, ur 15 (*)    All other components within normal limits  CBC WITH DIFFERENTIAL/PLATELET - Abnormal; Notable for the following:    Lymphs Abs 0.5 (*)     All other components within normal limits  COMPREHENSIVE METABOLIC PANEL - Abnormal; Notable for the following:    Glucose, Bld 110 (*)    Calcium 8.6 (*)    Total Protein 6.0 (*)    AST 45 (*)    All other components within normal limits  LIPASE, BLOOD   ____________________________________________  RADIOLOGY  Dg Chest 2 View  Result Date: 07/02/2016 CLINICAL DATA:  Cough and flu-like symptoms for 2 weeks. Nausea and vomiting. EXAM: CHEST  2 VIEW COMPARISON:  05/10/2008 FINDINGS: The heart size and mediastinal contours are within normal limits. Both lungs are clear. Surgical clips again seen in right axilla. The visualized skeletal structures are unremarkable. IMPRESSION: No active cardiopulmonary disease. Electronically Signed   By: Earle Gell M.D.   On: 07/02/2016 16:00    ____________________________________________   PROCEDURES  Procedure(s) performed:   Procedures  None ____________________________________________   INITIAL IMPRESSION / ASSESSMENT AND PLAN / ED COURSE  Pertinent labs & imaging results that were available during my care of the patient were reviewed by me and considered in my medical decision making (see chart for details).  Patient presents to the ED for evaluation of new onset vomiting in the setting of cough for the last 2 weeks. Patient with no focal abdominal tenderness. No CP or SOB to suggest atypical ACS. Patient received Zofran with EMS and is feeling better. Plan for IVF, labs, and CXR with 2 weeks of cough.   05:00 PM Normal CXR. No significant labs abnormality. Husband now at bedside to drive the patient home. Patient feels better. Will discharge with Zofran and PCP follow up.   At this time, I do not feel there is any life-threatening condition present. I have reviewed and discussed all results (EKG, imaging, lab, urine as appropriate), exam findings with patient. I have reviewed nursing notes and appropriate previous records.  I feel the  patient is safe to be discharged home without further emergent workup. Discussed usual and customary return precautions. Patient and family (if present) verbalize understanding and are comfortable with this plan.  Patient will follow-up with their primary care provider. If they do not have a primary care provider, information for follow-up has been provided to them. All questions have been answered.  ____________________________________________  FINAL CLINICAL IMPRESSION(S) / ED DIAGNOSES  Final diagnoses:  Nausea  Non-intractable vomiting with nausea, unspecified vomiting type  Cough     MEDICATIONS GIVEN DURING THIS VISIT:  Medications  sodium chloride 0.9 % bolus 1,000 mL (1,000 mLs Intravenous New Bag/Given 07/02/16 1532)     NEW OUTPATIENT MEDICATIONS STARTED DURING THIS VISIT:  New Prescriptions   ONDANSETRON (ZOFRAN ODT) 4 MG DISINTEGRATING TABLET    Take 1 tablet (4 mg total) by mouth every 8 (eight) hours as needed for nausea or vomiting.      Note:  This document was prepared using Dragon voice recognition software and may include unintentional dictation errors.  Nanda Quinton, MD Emergency Medicine   Margette Fast, MD 07/02/16 [phone]

## 2016-07-02 NOTE — ED Notes (Signed)
Pt states the nausea has subsided following the zofran.

## 2016-07-02 NOTE — Discharge Instructions (Signed)

## 2016-07-02 NOTE — ED Triage Notes (Signed)
[facility] report-pt with "flu like symptoms" x 1 week-n/v x today-pt brought in via stretcher/transferred to w/c-NAD

## 2016-07-07 ENCOUNTER — Other Ambulatory Visit: Payer: Self-pay | Admitting: Internal Medicine

## 2016-07-14 ENCOUNTER — Other Ambulatory Visit (INDEPENDENT_AMBULATORY_CARE_PROVIDER_SITE_OTHER): Payer: BLUE CROSS/BLUE SHIELD

## 2016-07-14 ENCOUNTER — Encounter: Payer: Self-pay | Admitting: Internal Medicine

## 2016-07-14 ENCOUNTER — Ambulatory Visit (INDEPENDENT_AMBULATORY_CARE_PROVIDER_SITE_OTHER): Payer: BLUE CROSS/BLUE SHIELD | Admitting: Internal Medicine

## 2016-07-14 VITALS — BP 110/70 | HR 73 | Temp 97.5°F | Resp 12 | Ht 65.0 in | Wt 97.5 lb

## 2016-07-14 DIAGNOSIS — Z01810 Encounter for preprocedural cardiovascular examination: Secondary | ICD-10-CM

## 2016-07-14 LAB — COMPREHENSIVE METABOLIC PANEL
ALBUMIN: 4.6 g/dL (ref 3.5–5.2)
ALT: 23 U/L (ref 0–35)
AST: 32 U/L (ref 0–37)
Alkaline Phosphatase: 55 U/L (ref 39–117)
BUN: 17 mg/dL (ref 6–23)
CALCIUM: 9.7 mg/dL (ref 8.4–10.5)
CHLORIDE: 102 meq/L (ref 96–112)
CO2: 31 mEq/L (ref 19–32)
CREATININE: 0.91 mg/dL (ref 0.40–1.20)
GFR: 66.31 mL/min (ref >60.00)
Glucose, Bld: 100 mg/dL — ABNORMAL HIGH (ref 70–99)
Potassium: 4.3 mEq/L (ref 3.5–5.1)
Sodium: 138 mEq/L (ref 135–145)
Total Bilirubin: 0.7 mg/dL (ref 0.2–1.2)
Total Protein: 6.8 g/dL (ref 6.0–8.3)

## 2016-07-14 LAB — T4, FREE: Free T4: 0.82 ng/dL (ref 0.60–1.60)

## 2016-07-14 LAB — LIPID PANEL
CHOLESTEROL: 185 mg/dL (ref 0–200)
HDL: 69.7 mg/dL (ref >39.00)
LDL CALC: 98 mg/dL (ref 0–99)
NonHDL: 115.08
TRIGLYCERIDES: 84 mg/dL (ref 0.0–149.0)
Total CHOL/HDL Ratio: 3
VLDL: 16.8 mg/dL (ref 0.0–40.0)

## 2016-07-14 LAB — CBC
HCT: 44.2 % (ref 36.0–46.0)
Hemoglobin: 14.7 g/dL (ref 12.0–15.0)
MCHC: 33.3 g/dL (ref 30.0–36.0)
MCV: 91.3 fl (ref 78.0–100.0)
PLATELETS: 216 10*3/uL (ref 150.0–400.0)
RBC: 4.84 Mil/uL (ref 3.87–5.11)
RDW: 13.8 % (ref 11.5–15.5)
WBC: 3.9 10*3/uL — AB (ref 4.0–10.5)

## 2016-07-14 LAB — TSH: TSH: 1.54 u[IU]/mL (ref 0.35–4.50)

## 2016-07-14 NOTE — Patient Instructions (Signed)
The EKG is the same of the heart.   We are checking the labs today and have given you the clearance for surgery.   It is okay to see a sports medicine doctor here or an orthopedic about the shin pain.    Shin Splints Rehab Ask your health care provider which exercises are safe for you. Do exercises exactly as told by your health care provider and adjust them as directed. It is normal to feel mild stretching, pulling, tightness, or discomfort as you do these exercises, but you should stop right away if you feel sudden pain or your pain gets worse.Do not begin these exercises until told by your health care provider. Stretching and range of motion exercise This exercise warms up your muscles and joints and improves the movement and flexibility of your lower leg. This exercise also helps to relieve pain, numbness, and tingling. Exercise A: Calf stretch, standing 1. Stand with the ball of your left / right foot on a step. The ball of your foot is on the walking surface, right under your toes. 2. Keep your other foot firmly on the same step. 3. Hold onto the wall, a railing, or a chair for balance. 4. Slowly lift your other foot, allowing your body weight to press your left / right heel down over the edge of the step. You should feel a stretch in your left / right calf. 5. Hold this position for __________ seconds. 6. Repeat this exercise with a slight bend in your left / right knee. Repeat __________ times with your left / right knee straight and __________ times with your left / right knee bent. Complete this exercise __________ times a day. Strengthening exercises These exercises build strength and endurance in your lower leg. Endurance is the ability to use your muscles for a long time, even after they get tired. Exercise B: Dorsiflexion 1. Secure a rubber exercise band or tubing to a fixed object, such as a table leg or a pole. 2. Secure the other end of the band around your left / right  foot. 3. Sit on the floor, facing the fixed object. The band should be slightly tense when your foot is relaxed. 4. Slowly use your ankle muscles to pull your foot toward you. 5. Hold this position for __________ seconds. 6. Slowly release the tension in the band and return your foot to the starting position. Repeat __________ times. Complete this exercise __________ times a day. Exercise C: Ankle eversion with band 1. Secure one end of a rubber exercise band or tubing to a fixed object, such as a table leg or a pole, that will stay in place when the band is pulled. 2. Loop the other end of the band around the middle of your left / right foot. 3. Sit on the floor, facing the fixed object. The band should be slightly tense when your foot is relaxed. 4. Make fists with your hands and put them between your knees. This will focus your strengthening at your ankle. 5. Leading with your little toe, slowly push your banded foot outward, away from your other leg. Make sure the band is positioned to resist the entire motion. 6. Hold this position for __________ seconds. 7. Control the tension in the band as you slowly return your foot to the starting position. Repeat __________ times. Complete this exercise __________ times a day. Exercise D: Ankle inversion with band 1. Secure one end of a rubber exercise band or tubing to a fixed  object, such as a table leg or a pole, that will stay still when the band is pulled. 2. Loop the other end of the band around your left / right foot, just below your toes. 3. Sit on the floor, facing the fixed object. The band should be slightly tense when your foot is relaxed. 4. Make fists with your hands and put them between your knees. This will focus your strengthening at your ankle. 5. Leading with your big toe, slowly pull your banded foot inward, toward your other leg. Make sure the band is positioned to resist the entire motion. 6. Hold this position for __________  seconds. 7. Control the tension in the band as you slowly return your foot to the starting position. Repeat __________ times. Complete this exercise __________ times a day. Exercise E: Lateral walking with band 1. Stand in a long hallway. 2. Wrap a loop of exercise band around your legs, just above your knees. 3. Bend your knees gently and drop your hips down and back so your weight is over your heels. 4. Step to the side to move down the length of the hallway, keeping your toes pointed forward and keeping tension in the band. 5. Repeat, leading with your other leg. Repeat __________ times. Complete this exercise __________ times a day. Balance exercise This exercise will help improve your control of your foot and ankle when you are standing or walking. Exercise F: Single leg stand 1. Without wearing shoes, stand near a railing or in a doorway. You may hold onto the railing or door frame as needed. 2. Stand on your left / right foot. Keep your big toe down on the floor and try to keep your arch lifted. 3. If this exercise is too easy, you can try doing it with your eyes closed or while standing on a pillow. 4. Hold this position for __________ seconds. Repeat __________ times. Complete this exercise __________ times a day. This information is not intended to replace advice given to you by your health care provider. Make sure you discuss any questions you have with your health care provider. Document Released: 06/02/2005 Document Revised: 02/05/2016 Document Reviewed: 03/01/2015 Elsevier Interactive Patient Education  2017 [employer].

## 2016-07-14 NOTE — Progress Notes (Signed)
   Subjective:    Patient ID: Cheryl Lawson, female    DOB: 1951/02/01, 66 y.o.   MRN: [id number]  HPI The patient is a 66 YO female coming in for pre-op clearance. She denies new chest pains or tightness. No SOB with exertion. She is able to climb 2 flights of stairs without stopping. No new stomach problems or concerns. She is not a a smoker. She is getting some kind of plastic surgery procedure done.   PMH, [facility], social history reviewed and updated.   Review of Systems  Constitutional: Negative.   HENT: Negative.   Eyes: Negative.   Respiratory: Negative for cough, chest tightness and shortness of breath.   Cardiovascular: Negative for chest pain, palpitations and leg swelling.  Gastrointestinal: Negative for abdominal distention, abdominal pain, constipation, diarrhea, nausea and vomiting.  Musculoskeletal: Negative.   Skin: Negative.   Neurological: Negative.   Psychiatric/Behavioral: Negative.       Objective:   Physical Exam  Constitutional: She is oriented to person, place, and time. She appears well-developed and well-nourished.  thin  HENT:  Head: Normocephalic and atraumatic.  Eyes: EOM are normal.  Neck: Normal range of motion.  Cardiovascular: Normal rate and regular rhythm.   Pulmonary/Chest: Effort normal and breath sounds normal. No respiratory distress. She has no wheezes. She has no rales.  Abdominal: Soft. Bowel sounds are normal. She exhibits no distension. There is no tenderness. There is no rebound.  Musculoskeletal: She exhibits no edema.  Neurological: She is alert and oriented to person, place, and time. Coordination normal.  Skin: Skin is warm and dry.  Psychiatric: She has a normal mood and affect.   Vitals:   07/14/16 1259  BP: 110/70  Pulse: 73  Resp: 12  Temp: 97.5 F (36.4 C)  TempSrc: Oral  SpO2: 99%  Weight: 97 lb 8 oz (44.2 kg)  Height: 5\' 5"  (1.651 m)   EKG: Rate 67, normal axis and intervals, no st or t wave changes, no change from  prior 2017    Assessment & Plan:  Visit time 25 minutes: greater than 50% of that time was spent in counseling and coordination of care with the patient. Counseled about the risk of surgical procedure and going through pre-op risk guidelines.

## 2016-07-14 NOTE — Assessment & Plan Note (Signed)
Checking labs and EKG without change from last year. Will clear as low risk for surgical procedure assuming labs are normal.

## 2016-07-14 NOTE — Progress Notes (Signed)
Pre visit review using our clinic review tool, if applicable. No additional management support is needed unless otherwise documented below in the visit note. 

## 2016-07-15 ENCOUNTER — Telehealth: Payer: Self-pay | Admitting: Internal Medicine

## 2016-07-15 NOTE — Telephone Encounter (Signed)
PLEASE NOTE: All timestamps contained within this report are represented as [place] Standard Time. CONFIDENTIALTY NOTICE: This fax transmission is intended only for the addressee. It contains information that is legally privileged, confidential or otherwise protected from use or disclosure. If you are not the intended recipient, you are strictly prohibited from reviewing, disclosing, copying using or disseminating any of this information or taking any action in reliance on or regarding this information. If you have received this fax in error, please notify us immediately by telephone so that we can arrange for its return to [place]. Phone: [phone], Toll-Free: [phone], Fax: [phone] Page: 1 of 1 Call Id: [id number] [facility] - Client [place] Patient Name: Cheryl Lawson DOB: 1950-10-25 Initial Comment caller states she has a large knot under the skin where she had blood drawn Nurse Assessment Nurse: Markus Daft, RN, Sherre Poot Date/Time (Eastern Time): 07/15/2016 2:57:10 PM Confirm and document reason for call. If symptomatic, describe symptoms. ---Caller states she has a large knot under the skin where she had blood drawn in right arm. Hospitalized briefly 5-6 days ago with complications r/t flu, and another IV put in the same place, and then labs drawn there. Size of about 1/2 walnut. Does the patient have any new or worsening symptoms? ---Yes Will a triage be completed? ---Yes Related visit to physician within the last 2 weeks? ---Yes Does the PT have any chronic conditions? (i.e. diabetes, asthma, etc.) ---Yes List chronic conditions. ---breast CA on right side 1998 where lymph nodes were removed Is this a behavioral health or substance abuse call? ---No Guidelines Guideline Title Affirmed Question Affirmed Notes IV Site (Skin) Symptoms Small painless lump at prior IV site (all triage questions negative) Final  Disposition [place], RN, [place] Disagree/Comply: Comply

## 2016-07-22 ENCOUNTER — Telehealth: Payer: Self-pay | Admitting: Internal Medicine

## 2016-07-22 NOTE — Telephone Encounter (Signed)
Pt called and said that she drop off some paperwork that was to be faxed over to her plastic surgeon.  Was this sent over?  She wants to know the status of this

## 2016-07-22 NOTE — Telephone Encounter (Signed)
LVM for patient to call back. ?

## 2016-07-31 ENCOUNTER — Ambulatory Visit: Payer: Self-pay | Admitting: Internal Medicine

## 2016-11-11 ENCOUNTER — Other Ambulatory Visit: Payer: Self-pay | Admitting: Internal Medicine

## 2016-11-15 ENCOUNTER — Telehealth: Payer: Self-pay

## 2016-11-15 NOTE — Telephone Encounter (Signed)
Fax came in from [facility].   Patient called and stated that she pulled a tick off the right side of her neck, really red. No other symptoms.   Advise was to see MD in 24 hours. No encounter is chart.   Forwarding as Juluis Rainier

## 2016-12-10 ENCOUNTER — Ambulatory Visit (INDEPENDENT_AMBULATORY_CARE_PROVIDER_SITE_OTHER): Payer: BLUE CROSS/BLUE SHIELD | Admitting: Family Medicine

## 2016-12-10 ENCOUNTER — Encounter: Payer: Self-pay | Admitting: Family Medicine

## 2016-12-10 VITALS — BP 104/68 | HR 77 | Temp 97.7°F | Resp 12 | Ht 65.0 in | Wt 100.0 lb

## 2016-12-10 DIAGNOSIS — Z8639 Personal history of other endocrine, nutritional and metabolic disease: Secondary | ICD-10-CM

## 2016-12-10 NOTE — Patient Instructions (Signed)
You will receive a call to schedule your ultrasound of the neck.

## 2016-12-10 NOTE — Progress Notes (Signed)
   Subjective:    Patient ID: Cheryl Lawson, female    DOB: Oct 16, 1950, 66 y.o.   MRN: [id number]  HPI This is a 66 yo female who presents today with concerns for thyroid issues. Has family history of thyroid problems. Has had thyroid nodules in past, last [place] was 05/16/14. She is concerned that she has prominence of her neck area. No symptoms hyper or hypothyroidism.   Past Medical History:  Diagnosis Date  . Anxiety   . Breast cancer ([place])   . Depression    Past Surgical History:  Procedure Laterality Date  . BREAST LUMPECTOMY     right  . FACIAL COSMETIC SURGERY    . infertility surgery    . NASAL SEPTUM SURGERY    . NECK SURGERY     lift   . TONSILLECTOMY     Family History  Problem Relation Age of Onset  . Ovarian cancer Mother   . Colon cancer Mother    Social History  Substance Use Topics  . Smoking status: Never Smoker  . Smokeless tobacco: Never Used  . Alcohol use Yes     Comment: rare      Review of Systems Per HPI    Objective:   Physical Exam  Constitutional: She is oriented to person, place, and time.  Thin   HENT:  Head: Normocephalic and atraumatic.  Eyes: Conjunctivae are normal.  Neck: Normal range of motion. Neck supple. No tracheal deviation present. No thyromegaly present.  Cardiovascular: Normal rate.   Pulmonary/Chest: Effort normal.  Lymphadenopathy:    She has no cervical adenopathy.  Neurological: She is alert and oriented to person, place, and time.  Skin: Skin is warm and dry.  Psychiatric: She has a normal mood and affect. Her behavior is normal. Judgment and thought content normal.  Vitals reviewed.     BP 104/68 (BP Location: Left Arm, Patient Position: Sitting, Cuff Size: Normal)   Pulse 77   Temp 97.7 F (36.5 C) (Oral)   Resp 12   Ht 5\' 5"  (1.651 m)   Wt 100 lb (45.4 kg)   SpO2 98%   BMI 16.64 kg/m  Wt Readings from Last 3 Encounters:  12/10/16 100 lb (45.4 kg)  07/14/16 97 lb 8 oz (44.2 kg)  07/02/16 100 lb  (45.4 kg)       Assessment & Plan:  1. History of thyroid nodule - exam unremarkable today, given history of thyroid nodule, will schedule [place] - US Soft Tissue Head/Neck; Future - normal TSH/T4, free 07/14/16, no need to repeat today  Clarene Reamer, FNP-BC  [facility]  12/10/2016 8:45 PM

## 2016-12-22 ENCOUNTER — Ambulatory Visit
Admission: RE | Admit: 2016-12-22 | Discharge: 2016-12-22 | Disposition: A | Discharge status: Home / Self Care | Payer: BLUE CROSS/BLUE SHIELD | Source: Ambulatory Visit | Attending: Family Medicine | Admitting: Family Medicine

## 2016-12-22 ENCOUNTER — Other Ambulatory Visit: Payer: Self-pay

## 2016-12-22 DIAGNOSIS — Z8639 Personal history of other endocrine, nutritional and metabolic disease: Secondary | ICD-10-CM

## 2017-01-11 ENCOUNTER — Other Ambulatory Visit: Payer: Self-pay | Admitting: Internal Medicine

## 2017-01-12 NOTE — Telephone Encounter (Signed)
Last dispensed 11/12/2016 with 30 tabs

## 2017-03-26 ENCOUNTER — Telehealth: Payer: Self-pay

## 2017-03-26 ENCOUNTER — Encounter: Payer: Self-pay | Admitting: Internal Medicine

## 2017-03-26 ENCOUNTER — Ambulatory Visit (INDEPENDENT_AMBULATORY_CARE_PROVIDER_SITE_OTHER): Payer: Managed Care, Other (non HMO) | Admitting: Internal Medicine

## 2017-03-26 ENCOUNTER — Other Ambulatory Visit (INDEPENDENT_AMBULATORY_CARE_PROVIDER_SITE_OTHER): Payer: Managed Care, Other (non HMO)

## 2017-03-26 VITALS — BP 98/60 | HR 74 | Temp 98.6°F | Ht 65.0 in | Wt 102.0 lb

## 2017-03-26 DIAGNOSIS — Z01810 Encounter for preprocedural cardiovascular examination: Secondary | ICD-10-CM

## 2017-03-26 LAB — COMPREHENSIVE METABOLIC PANEL
ALK PHOS: 51 U/L (ref 39–117)
ALT: 20 U/L (ref 0–35)
AST: 37 U/L (ref 0–37)
Albumin: 4.4 g/dL (ref 3.5–5.2)
BILIRUBIN TOTAL: 0.7 mg/dL (ref 0.2–1.2)
BUN: 19 mg/dL (ref 6–23)
CO2: 31 meq/L (ref 19–32)
CREATININE: 0.99 mg/dL (ref 0.40–1.20)
Calcium: 9.3 mg/dL (ref 8.4–10.5)
Chloride: 104 mEq/L (ref 96–112)
GFR: 60.03 mL/min (ref >60.00)
GLUCOSE: 96 mg/dL (ref 70–99)
Potassium: 4.1 mEq/L (ref 3.5–5.1)
Sodium: 141 mEq/L (ref 135–145)
TOTAL PROTEIN: 6.8 g/dL (ref 6.0–8.3)

## 2017-03-26 LAB — LIPID PANEL
CHOL/HDL RATIO: 3
Cholesterol: 190 mg/dL (ref 0–200)
HDL: 71.6 mg/dL (ref >39.00)
LDL Cholesterol: 106 mg/dL — ABNORMAL HIGH (ref 0–99)
NONHDL: 118.48
Triglycerides: 60 mg/dL (ref 0.0–149.0)
VLDL: 12 mg/dL (ref 0.0–40.0)

## 2017-03-26 LAB — CBC
HCT: 44.2 % (ref 36.0–46.0)
HEMOGLOBIN: 14.9 g/dL (ref 12.0–15.0)
MCHC: 33.6 g/dL (ref 30.0–36.0)
MCV: 92.4 fl (ref 78.0–100.0)
PLATELETS: 181 10*3/uL (ref 150.0–400.0)
RBC: 4.78 Mil/uL (ref 3.87–5.11)
RDW: 13 % (ref 11.5–15.5)
WBC: 3.7 10*3/uL — AB (ref 4.0–10.5)

## 2017-03-26 NOTE — Progress Notes (Signed)
   Subjective:    Patient ID: Cheryl Lawson, female    DOB: 1951-02-01, 66 y.o.   MRN: [id number]  HPI The patient is a 66 YO female coming in for pre-op clearance for plastic surgery procedure. She is having the left side of her face tightened as she feels it has fallen since the implant is placed. She is a non-smoker. Is able to climb 2 flights of stairs without SOB. Blood pressure is controlled without medications. Denies chest pains or SOB. She does exercise regularly. This problem with her face is causing her a lot of anxiety and concern.   PMH, [facility], social history reviewed and updated.   Review of Systems  Constitutional: Negative.   HENT: Negative.   Eyes: Negative.   Respiratory: Negative for cough, chest tightness and shortness of breath.   Cardiovascular: Negative for chest pain, palpitations and leg swelling.  Gastrointestinal: Negative for abdominal distention, abdominal pain, constipation, diarrhea, nausea and vomiting.  Musculoskeletal: Negative.   Skin: Negative.   Neurological: Negative.   Psychiatric/Behavioral: Positive for dysphoric mood. The patient is nervous/anxious.       Objective:   Physical Exam  Constitutional: She is oriented to person, place, and time. She appears well-developed and well-nourished.  Thin  HENT:  Head: Normocephalic and atraumatic.  Evidence of plastic surgery evident on her face  Eyes: EOM are normal.  Neck: Normal range of motion.  Cardiovascular: Normal rate and regular rhythm.   Pulmonary/Chest: Effort normal and breath sounds normal. No respiratory distress. She has no wheezes. She has no rales.  Abdominal: Soft. Bowel sounds are normal. She exhibits no distension. There is no tenderness. There is no rebound.  Musculoskeletal: She exhibits no edema.  Neurological: She is alert and oriented to person, place, and time. Coordination normal.  Skin: Skin is warm and dry.  Psychiatric: She has a normal mood and affect.   Vitals:   03/26/17 0953  BP: 98/60  Pulse: 74  Temp: 98.6 F (37 C)  TempSrc: Oral  SpO2: 100%  Weight: 102 lb (46.3 kg)  Height: 5\' 5"  (1.651 m)   EKG: Rate 63, axis normal, PR borderline short, sinus, no st or t wave changes, no change compared to prior 06/2016    Assessment & Plan:

## 2017-03-26 NOTE — Assessment & Plan Note (Addendum)
Checking labs and EKG done today which is unchanged from prior. Will clear for low risk surgical procedure. Notes and labs and EKG faxed to plastic surgeon.

## 2017-03-26 NOTE — Telephone Encounter (Signed)
Patient notified that forms have been faxed for her surgical clearance

## 2017-03-30 ENCOUNTER — Other Ambulatory Visit: Payer: Self-pay | Admitting: Internal Medicine

## 2017-03-31 NOTE — Telephone Encounter (Signed)
Faxed to pharmacy

## 2017-03-31 NOTE — Telephone Encounter (Signed)
Patient called in regard.

## 2017-05-18 ENCOUNTER — Encounter: Payer: Self-pay | Admitting: Internal Medicine

## 2017-05-18 NOTE — Progress Notes (Signed)
Abstracted and sent to scan  

## 2017-08-07 ENCOUNTER — Other Ambulatory Visit: Payer: Self-pay | Admitting: Internal Medicine

## 2017-08-07 NOTE — Telephone Encounter (Signed)
Control database checked last refill: 03/31/2017 LOV: 03/26/2017 for surgical clearance, last physical 05/10/2014

## 2017-09-30 ENCOUNTER — Ambulatory Visit: Payer: Managed Care, Other (non HMO) | Admitting: Family Medicine

## 2017-09-30 ENCOUNTER — Encounter: Payer: Self-pay | Admitting: Family Medicine

## 2017-09-30 ENCOUNTER — Ambulatory Visit: Payer: Self-pay

## 2017-09-30 ENCOUNTER — Ambulatory Visit: Payer: Self-pay | Admitting: Family Medicine

## 2017-09-30 VITALS — BP 106/80 | HR 62 | Ht 65.0 in | Wt 103.0 lb

## 2017-09-30 DIAGNOSIS — M19011 Primary osteoarthritis, right shoulder: Secondary | ICD-10-CM | POA: Diagnosis not present

## 2017-09-30 DIAGNOSIS — M79601 Pain in right arm: Secondary | ICD-10-CM | POA: Diagnosis not present

## 2017-09-30 DIAGNOSIS — M19019 Primary osteoarthritis, unspecified shoulder: Secondary | ICD-10-CM | POA: Insufficient documentation

## 2017-09-30 MED ORDER — DICLOFENAC SODIUM 2 % TD SOLN: 2.0000 g | Freq: Two times a day (BID) | TRANSDERMAL | 3 refills | Status: DC

## 2017-09-30 NOTE — Assessment & Plan Note (Signed)
Patient given injection and tolerated the procedure well.  Discussed icing regimen and home exercises.  Discussed which activities of doing which wants to avoid.  Increase activity as tolerated.  Follow-up again 4 weeks

## 2017-09-30 NOTE — Patient Instructions (Addendum)
Good to see you.  Ice 20 minutes 2 times daily. Usually after activity and before bed. More of the [facility] joint.  pennsaid pinkie amount topically 2 times daily as needed.  Keep hands within peripheral vision.   Exercises 3 times a week.  You should do well  See me again in 4 week s

## 2017-09-30 NOTE — Progress Notes (Signed)
[name] [place] [place], [place] [address] Phone: [phone] Subjective:    I'm seeing this patient by the request  of:  Cheryl Koch, MD   CC: Right arm pain  [id number]  Cheryl Lawson is a 67 y.o. female coming in with complaint of arm pain. Right arm pain. 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 Has a history of breast cancer (1998).   Onset- chronic Location- Deltoid Duration-  Character-  Aggravating factors- flexion, taking a shirt off Reliving factors-  Therapies tried- Massage Severity- 8-9     Past Medical History:  Diagnosis Date  . Anxiety   . Breast cancer ([place])   . Depression    Past Surgical History:  Procedure Laterality Date  . BREAST LUMPECTOMY     right  . FACIAL COSMETIC SURGERY    . infertility surgery    . NASAL SEPTUM SURGERY    . NECK SURGERY     lift   . TONSILLECTOMY     Social History   Socioeconomic History  . Marital status: Married    Spouse name: Not on file  . Number of children: 1  . Years of education: 65  . Highest education level: Not on file  Occupational History    Employer: [employer]  Social Needs  . Financial resource strain: Not on file  . Food insecurity:    Worry: Not on file    Inability: Not on file  . Transportation needs:    Medical: Not on file    Non-medical: Not on file  Tobacco Use  . Smoking status: Never Smoker  . Smokeless tobacco: Never Used  Substance and Sexual Activity  . Alcohol use: Yes    Comment: rare  . Drug use: No  . Sexual activity: Not on file  Lifestyle  . Physical activity:    Days per week: Not on file    Minutes per session: Not on file  . Stress: Not on file  Relationships  . Social connections:    Talks on phone: Not on file    Gets together: Not on file    Attends religious service: Not on file    Active member of club or organization: Not on file    Attends meetings of clubs or organizations: Not on file   Relationship status: Not on file  Other Topics Concern  . Not on file  Social History Narrative   HSG. Married - '77. great niece lives with her. 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 no history of physical or sexual abuse.    No Known Allergies Family History  Problem Relation Age of Onset  . Ovarian cancer Mother   . Colon cancer Mother      Past medical history, social, surgical and family history all reviewed in electronic medical record.  No pertanent information unless stated regarding to the chief complaint.   Review of Systems:Review of systems updated and as accurate as of 09/30/17  No headache, visual changes, nausea, vomiting, diarrhea, constipation, dizziness, abdominal pain, skin rash, fevers, chills, night sweats, weight loss, swollen lymph nodes, body aches, joint swelling, chest pain, shortness of breath, mood changes.  Positive muscle aches  Objective  Blood pressure 106/80, pulse 62, height 5\' 5"  (1.651 m), weight 103 lb (46.7 kg), SpO2 94 %. Systems examined below as of 09/30/17   General: No apparent distress alert and oriented x3 mood and affect normal, dressed appropriately.  HEENT: Pupils equal, extraocular movements intact  Respiratory: Patient's speak in full sentences and does not appear short of breath  Cardiovascular: No lower extremity edema, non tender, no erythema  Skin: Warm dry intact with no signs of infection or rash on extremities or on axial skeleton.  Abdomen: Soft nontender  Neuro: Cranial nerves II through XII are intact, neurovascularly intact in all extremities with 2+ DTRs and 2+ pulses.  Lymph: No lymphadenopathy of posterior or anterior cervical chain or axillae bilaterally.  Gait normal with good balance and coordination.  MSK:  Non tender with full range of motion and good stability and symmetric strength and tone of  elbows, wrist, hip, knee and ankles bilaterally.  Shoulder: Right Inspection reveals  no abnormalities, atrophy or asymmetry. Palpation is normal with no tenderness over AC joint or bicipital groove. ROM is full in all planes passively. Rotator cuff strength normal throughout. signs of impingement with positive Neer and Hawkin's tests, but negative empty can sign. Speeds and Yergason's tests normal. No labral pathology noted with negative Obrien's, negative clunk and good stability.  Positive crossover Normal scapular function observed. No painful arc and no drop arm sign. No apprehension sign  MSK US performed of: Right This study was ordered, performed, and interpreted by Charlann Boxer D.O.  Shoulder:   Supraspinatus:  Appears normal on long and transverse views, Bursal bulge seen with shoulder abduction on impingement view. Infraspinatus:  Appears normal on long and transverse views. Significant increase in Doppler flow Subscapularis:  Appears normal on long and transverse views. Positive bursa Teres Minor:  Appears normal on long and transverse views. AC joint: Moderate arthritic changes with significant distention of the capsulitis. Glenohumeral Joint:  Appears normal without effusion. Glenoid Labrum:  Intact without visualized tears. Biceps Tendon:  Appears normal on long and transverse views, no fraying of tendon, tendon located in intertubercular groove, no subluxation with shoulder internal or external rotation.  Impression: Acromial clavicular arthritis  Procedure: Real-time Ultrasound Guided Injection of right ac joint  Device: GE Logiq E  Ultrasound guided injection is preferred based studies that show increased duration, increased effect, greater accuracy, decreased procedural pain, increased response rate with ultrasound guided versus blind injection.  Verbal informed consent obtained.  Time-out conducted.  Noted no overlying erythema, induration, or other signs of local infection.  Skin prepped in a sterile fashion.  Local anesthesia: Topical Ethyl  chloride.  With sterile technique and under real time ultrasound guidance:  Joint visualized.  23g 1  inch needle inserted superiorapproach. Pictures taken for needle placement. Patient did have injection of0.5 cc of 0.5% Marcaine, and 0.5 cc of Kenalog 40 mg/dL. Completed without difficulty  Pain immediately resolved suggesting accurate placement of the medication.  Advised to call if fevers/chills, erythema, induration, drainage, or persistent bleeding.  Images permanently stored and available for review in the ultrasound unit.  Impression: Technically successful ultrasound guided injection.   [id number]; 15 additional minutes spent for Therapeutic exercises as stated in above notes.  This included exercises focusing on stretching, strengthening, with significant focus on eccentric aspects.   Long term goals include an improvement in range of motion, strength, endurance as well as avoiding reinjury. Patient's frequency would include in 1-2 times a day, 3-5 times a week for a duration of 6-12 weeks. Shoulder Exercises that included:  Basic scapular stabilization to include adduction and depression of scapula Scaption, focusing on proper movement and good control Internal and External rotation utilizing a theraband, with elbow tucked at side  entire time Rows with theraband   Proper technique shown and discussed handout in great detail with ATC.  All questions were discussed and answered.     Impression and Recommendations:     This case required medical decision making of moderate complexity.      Note: This dictation was prepared with Dragon dictation along with smaller phrase technology. Any transcriptional errors that result from this process are unintentional.

## 2017-10-29 ENCOUNTER — Other Ambulatory Visit: Payer: Self-pay | Admitting: Internal Medicine

## 2017-10-29 NOTE — Telephone Encounter (Signed)
Control database checked last refill: 08/07/2017 LOV: 03/26/2017

## 2017-11-24 ENCOUNTER — Telehealth: Payer: Self-pay | Admitting: Internal Medicine

## 2017-11-24 NOTE — Telephone Encounter (Signed)
Spoke to pt, gave her medical records phone number.

## 2017-11-24 NOTE — Telephone Encounter (Signed)
Copied from [place] [phone]. Topic: Quick Communication - See Telephone Encounter >> Nov 24, 2017  9:04 AM Cleaster Corin, NT wrote: CRM for notification. See Telephone encounter for: 11/24/17.  Pt. Would like to come office to pick up ultra sound on a disk of right shoulder pt. Is going to see a specialist on Friday and was seeing if she could pick it up by then.pt. Can be reached at  [phone]

## 2017-12-14 ENCOUNTER — Telehealth: Payer: Self-pay | Admitting: Internal Medicine

## 2017-12-14 NOTE — Telephone Encounter (Signed)
Please advise thanks.

## 2017-12-14 NOTE — Telephone Encounter (Signed)
Left patient VM to call back to give [place] more information in regard to what she is asking for.  Patient seen Cheryl Lawson on 4/17.

## 2017-12-14 NOTE — Telephone Encounter (Signed)
Copied from [place] [id number]. Topic: General - Other >> Dec 14, 2017 12:33 PM Keene Breath wrote: Reason for CRM: Patient called to request the provider to fill out a Description of Service or Supplies for [employer] in order for them to accept her claim.  Date of service was 09/30/17.  Patient's call back # [phone].  [employer] # [phone].

## 2018-01-10 ENCOUNTER — Other Ambulatory Visit: Payer: Self-pay | Admitting: Internal Medicine

## 2018-01-11 NOTE — Telephone Encounter (Signed)
Control database checked last refill: 10/30/2017 LOV: 03/26/2017 for Pre-op exam

## 2018-02-19 ENCOUNTER — Other Ambulatory Visit: Payer: Self-pay | Admitting: Internal Medicine

## 2018-02-22 NOTE — Telephone Encounter (Signed)
Control database checked last refill: 01/11/2018 LOV: acute 09/20/17  NOV: NONE

## 2018-03-19 ENCOUNTER — Ambulatory Visit (INDEPENDENT_AMBULATORY_CARE_PROVIDER_SITE_OTHER): Payer: BLUE CROSS/BLUE SHIELD | Admitting: Internal Medicine

## 2018-03-19 ENCOUNTER — Encounter: Payer: Self-pay | Admitting: Internal Medicine

## 2018-03-19 ENCOUNTER — Other Ambulatory Visit (INDEPENDENT_AMBULATORY_CARE_PROVIDER_SITE_OTHER): Payer: BLUE CROSS/BLUE SHIELD

## 2018-03-19 VITALS — BP 100/70 | HR 76 | Temp 97.8°F | Ht 65.0 in | Wt 100.0 lb

## 2018-03-19 DIAGNOSIS — Z853 Personal history of malignant neoplasm of breast: Secondary | ICD-10-CM

## 2018-03-19 DIAGNOSIS — Z0000 Encounter for general adult medical examination without abnormal findings: Secondary | ICD-10-CM

## 2018-03-19 DIAGNOSIS — E063 Autoimmune thyroiditis: Secondary | ICD-10-CM

## 2018-03-19 DIAGNOSIS — Z01818 Encounter for other preprocedural examination: Secondary | ICD-10-CM

## 2018-03-19 DIAGNOSIS — Z01810 Encounter for preprocedural cardiovascular examination: Secondary | ICD-10-CM

## 2018-03-19 DIAGNOSIS — F418 Other specified anxiety disorders: Secondary | ICD-10-CM

## 2018-03-19 LAB — LIPID PANEL
CHOL/HDL RATIO: 3
Cholesterol: 192 mg/dL (ref 0–200)
HDL: 68.5 mg/dL (ref >39.00)
LDL Cholesterol: 105 mg/dL — ABNORMAL HIGH (ref 0–99)
NONHDL: 123.33
TRIGLYCERIDES: 93 mg/dL (ref 0.0–149.0)
VLDL: 18.6 mg/dL (ref 0.0–40.0)

## 2018-03-19 LAB — COMPREHENSIVE METABOLIC PANEL
ALBUMIN: 4.5 g/dL (ref 3.5–5.2)
ALK PHOS: 52 U/L (ref 39–117)
ALT: 20 U/L (ref 0–35)
AST: 35 U/L (ref 0–37)
BILIRUBIN TOTAL: 0.5 mg/dL (ref 0.2–1.2)
BUN: 14 mg/dL (ref 6–23)
CALCIUM: 9.8 mg/dL (ref 8.4–10.5)
CO2: 30 meq/L (ref 19–32)
Chloride: 104 mEq/L (ref 96–112)
Creatinine, Ser: 0.96 mg/dL (ref 0.40–1.20)
GFR: 62.01 mL/min (ref >60.00)
Glucose, Bld: 87 mg/dL (ref 70–99)
Potassium: 4.2 mEq/L (ref 3.5–5.1)
Sodium: 141 mEq/L (ref 135–145)
Total Protein: 7.1 g/dL (ref 6.0–8.3)

## 2018-03-19 LAB — CBC
HCT: 43.5 % (ref 36.0–46.0)
HEMOGLOBIN: 14.7 g/dL (ref 12.0–15.0)
MCHC: 33.9 g/dL (ref 30.0–36.0)
MCV: 91.5 fl (ref 78.0–100.0)
PLATELETS: 195 10*3/uL (ref 150.0–400.0)
RBC: 4.75 Mil/uL (ref 3.87–5.11)
RDW: 13.4 % (ref 11.5–15.5)
WBC: 4.6 10*3/uL (ref 4.0–10.5)

## 2018-03-19 LAB — TSH: TSH: 1.91 u[IU]/mL (ref 0.35–4.50)

## 2018-03-19 LAB — T4, FREE: FREE T4: 0.91 ng/dL (ref 0.60–1.60)

## 2018-03-19 MED ORDER — ALPRAZOLAM 0.25 MG PO TABS: 0.2500 mg | ORAL_TABLET | Freq: Every evening | ORAL | 1 refills | Status: DC | PRN

## 2018-03-19 NOTE — Progress Notes (Signed)
   Subjective:    Patient ID: Cheryl Lawson, female    DOB: 07-Feb-1951, 67 y.o.   MRN: [id number]  HPI The patient is a 67 YO female coming in for physical and pre-op clearance. Can climb 10 flights of stairs without stopping.   PMH, [facility], social history reviewed and updated.   Review of Systems  Constitutional: Negative.   HENT: Negative.   Eyes: Negative.   Respiratory: Negative for cough, chest tightness and shortness of breath.   Cardiovascular: Negative for chest pain, palpitations and leg swelling.  Gastrointestinal: Negative for abdominal distention, abdominal pain, constipation, diarrhea, nausea and vomiting.  Musculoskeletal: Negative.   Skin: Negative.   Neurological: Negative.   Psychiatric/Behavioral: Negative.       Objective:   Physical Exam  Constitutional: She is oriented to person, place, and time. She appears well-developed and well-nourished.  HENT:  Head: Normocephalic and atraumatic.  Eyes: EOM are normal.  Neck: Normal range of motion.  Cardiovascular: Normal rate and regular rhythm.  Pulmonary/Chest: Effort normal and breath sounds normal. No respiratory distress. She has no wheezes. She has no rales.  Abdominal: Soft. Bowel sounds are normal. She exhibits no distension. There is no tenderness. There is no rebound.  Musculoskeletal: She exhibits no edema.  Neurological: She is alert and oriented to person, place, and time. Coordination normal.  Skin: Skin is warm and dry.  Psychiatric: She has a normal mood and affect.   Vitals:   03/19/18 1312  BP: 100/70  Pulse: 76  Temp: 97.8 F (36.6 C)  TempSrc: Oral  SpO2: 98%  Weight: 100 lb (45.4 kg)  Height: 5\' 5"  (1.651 m)   EKG: Rate 67, axis normal, pr short other interval normal, sinus, no st or t wave changes, no change compared with 2018.    Assessment & Plan:

## 2018-03-19 NOTE — Assessment & Plan Note (Signed)
Checking TSH and free T4. No change to thyroid on exam today.

## 2018-03-19 NOTE — Assessment & Plan Note (Signed)
Flu shot declines. Pneumonia not indicated. Shingrix declines. Tetanus declines. Colonoscopy up to date. Mammogram up to date, pap smear up to date and dexa up to date. Counseled about sun safety and mole surveillance. Counseled about the dangers of distracted driving. Given 10 year screening recommendations.

## 2018-03-19 NOTE — Assessment & Plan Note (Signed)
EKG done today along with CBC, CMP. Low risk to proceed to surgery elective at this time.

## 2018-03-19 NOTE — Assessment & Plan Note (Signed)
Uses xanax rarely, refilled today.

## 2018-03-19 NOTE — Patient Instructions (Addendum)

## 2018-03-19 NOTE — Assessment & Plan Note (Signed)
Recent mammogram normal and gets yearly.

## 2018-06-02 DIAGNOSIS — M25561 Pain in right knee: Secondary | ICD-10-CM | POA: Diagnosis not present

## 2018-07-16 ENCOUNTER — Ambulatory Visit: Payer: Self-pay | Admitting: Internal Medicine

## 2018-07-16 NOTE — Telephone Encounter (Signed)
Pt. Is currently on a cruise and has been seeing bright red blood mixed in with stools and in the toilet water x 2 days. Having rectal pain. Went to the infirmary on the ship and was examined. No hemorrhoids detected. No fever or abdominal pain.Will be in port tomorrow in [place], and will drive home.Per [place], [place] to schedule pt. For Monday morning at 10:00. Instructed if symptoms worsen instructed to go to ED. Verbalizes understanding.  Reason for Disposition . MILD rectal bleeding (more than just a few drops or streaks)  Answer Assessment - Initial Assessment Questions 1. APPEARANCE of BLOOD: "What color is it?" "Is it passed separately, on the surface of the stool, or mixed in with the stool?"      Bright red blood 2. AMOUNT: "How much blood was passed?"      Unsure 3. FREQUENCY: "How many times has blood been passed with the stools?"      5 4. ONSET: "When was the blood first seen in the stools?" (Days or weeks)      2-3 DAYS ago 5. DIARRHEA: "Is there also some diarrhea?" If so, ask: "How many diarrhea stools were passed in past 24 hours?"      No 6. CONSTIPATION: "Do you have constipation?" If so, "How bad is it?"     No 7. RECURRENT SYMPTOMS: "Have you had blood in your stools before?" If so, ask: "When was the last time?" and "What happened that time?"      No 8. BLOOD THINNERS: "Do you take any blood thinners?" (e.g., Coumadin/warfarin, Pradaxa/dabigatran, aspirin)     No 9. OTHER SYMPTOMS: "Do you have any other symptoms?"  (e.g., abdominal pain, vomiting, dizziness, fever)     Rectal pain 10. PREGNANCY: "Is there any chance you are pregnant?" "When was your last menstrual period?"       No  Protocols used: RECTAL BLEEDING-A-AH

## 2018-07-16 NOTE — Telephone Encounter (Signed)
fyi

## 2018-07-17 ENCOUNTER — Telehealth: Payer: Self-pay | Admitting: Nurse Practitioner

## 2018-07-17 DIAGNOSIS — R109 Unspecified abdominal pain: Secondary | ICD-10-CM | POA: Diagnosis not present

## 2018-07-17 DIAGNOSIS — K6289 Other specified diseases of anus and rectum: Secondary | ICD-10-CM | POA: Diagnosis not present

## 2018-07-17 DIAGNOSIS — R195 Other fecal abnormalities: Secondary | ICD-10-CM | POA: Diagnosis not present

## 2018-07-17 NOTE — Telephone Encounter (Signed)
Patient known to Dr. Ardis Hughs.  She had a normal colonoscopy December 2013.  Patient called the answering service today with complaints of rectal bleeding.  I returned patient's call.  She is on the way back from [place], having blood in stools.  Typically has 1 bowel movement a day now now having urge to go more often.  Stools are red.  Not having any abdominal pain, nausea or vomiting.  Does not take anti-inflammatories on a regular basis.  Patient currently feels okay without dizziness, shortness of breath.   I am concerned about hemodynamic instability in this patient.  She will not be in [place] until tonight.  I advised her to come to the ED when she arrives in [place] BUT anytime during her travels if she feels dizzy, lightheaded, short of breath, or if the frequency of hematochezia increases then she will need to go to the nearest ED for stabilization.  I will forward this to patient's primary GI, Dr. Ardis Hughs.  I will also forward to my office nurse skin her to please call patient on Monday, assuming patient does not present to [facility] or [facility] the ED

## 2018-07-19 NOTE — Telephone Encounter (Signed)
Left message on machine to call back  

## 2018-07-19 NOTE — Telephone Encounter (Signed)
I spoke with the pt and she states she was seen in a [facility] ED and no issue were seen.  She states she was told that it was the beets she ate while on her cruise.  The red stools have since resolved.  She had CBC while in the ED and was told her Hgb was 12.  She will call if she has any further concerns

## 2018-07-19 NOTE — Telephone Encounter (Signed)
Cheryl Lawson, thanks  [place], can you reach out to her. See how she is doing. She'll at least need a CBC today.

## 2018-07-27 DIAGNOSIS — Z124 Encounter for screening for malignant neoplasm of cervix: Secondary | ICD-10-CM | POA: Diagnosis not present

## 2018-09-02 ENCOUNTER — Other Ambulatory Visit (INDEPENDENT_AMBULATORY_CARE_PROVIDER_SITE_OTHER): Payer: Medicare Other

## 2018-09-02 ENCOUNTER — Other Ambulatory Visit: Payer: Self-pay

## 2018-09-02 ENCOUNTER — Encounter: Payer: Self-pay | Admitting: Internal Medicine

## 2018-09-02 ENCOUNTER — Ambulatory Visit (INDEPENDENT_AMBULATORY_CARE_PROVIDER_SITE_OTHER): Payer: Medicare Other | Admitting: Internal Medicine

## 2018-09-02 VITALS — BP 90/64 | HR 69 | Temp 97.6°F | Ht 65.0 in | Wt 99.0 lb

## 2018-09-02 DIAGNOSIS — Z01810 Encounter for preprocedural cardiovascular examination: Secondary | ICD-10-CM | POA: Diagnosis not present

## 2018-09-02 LAB — COMPREHENSIVE METABOLIC PANEL
ALBUMIN: 4.5 g/dL (ref 3.5–5.2)
ALK PHOS: 54 U/L (ref 39–117)
ALT: 17 U/L (ref 0–35)
AST: 26 U/L (ref 0–37)
BUN: 16 mg/dL (ref 6–23)
CO2: 30 mEq/L (ref 19–32)
Calcium: 9.5 mg/dL (ref 8.4–10.5)
Chloride: 103 mEq/L (ref 96–112)
Creatinine, Ser: 0.97 mg/dL (ref 0.40–1.20)
GFR: 57.57 mL/min — ABNORMAL LOW (ref >60.00)
GLUCOSE: 63 mg/dL — AB (ref 70–99)
POTASSIUM: 3.8 meq/L (ref 3.5–5.1)
Sodium: 140 mEq/L (ref 135–145)
TOTAL PROTEIN: 6.8 g/dL (ref 6.0–8.3)
Total Bilirubin: 0.8 mg/dL (ref 0.2–1.2)

## 2018-09-02 LAB — CBC
HEMATOCRIT: 45.3 % (ref 36.0–46.0)
HEMOGLOBIN: 15.4 g/dL — AB (ref 12.0–15.0)
MCHC: 34 g/dL (ref 30.0–36.0)
MCV: 92 fl (ref 78.0–100.0)
PLATELETS: 194 10*3/uL (ref 150.0–400.0)
RBC: 4.92 Mil/uL (ref 3.87–5.11)
RDW: 13.2 % (ref 11.5–15.5)
WBC: 7.8 10*3/uL (ref 4.0–10.5)

## 2018-09-02 NOTE — Progress Notes (Signed)
   Subjective:   Patient ID: Cheryl Lawson, female    DOB: 21-Jun-1950, 68 y.o.   MRN: [id number]  HPI The patient is 68 YO female coming in for pre-op evaluation. She is planning on getting cheek adjustment due to chronic pain from prior problems with left cheek implant on April 6th. She denies chest pains or SOB. She denies current cough. Denies recent travel or exposure to known coronavirus. She does not have respiratory symptoms. No fevers or chills. Walking 50 minutes daily without symptoms.   PMH, [facility], social history reviewed and updated  Review of Systems  Constitutional: Negative.   HENT: Negative.   Eyes: Negative.   Respiratory: Negative for cough, chest tightness and shortness of breath.   Cardiovascular: Negative for chest pain, palpitations and leg swelling.  Gastrointestinal: Negative for abdominal distention, abdominal pain, constipation, diarrhea, nausea and vomiting.  Musculoskeletal: Positive for myalgias.  Skin: Negative.   Neurological: Negative.   Psychiatric/Behavioral: Negative.     Objective:  Physical Exam Constitutional:      Appearance: She is well-developed.     Comments: thin  HENT:     Head: Normocephalic and atraumatic.  Neck:     Musculoskeletal: Normal range of motion.  Cardiovascular:     Rate and Rhythm: Normal rate and regular rhythm.  Pulmonary:     Effort: Pulmonary effort is normal. No respiratory distress.     Breath sounds: Normal breath sounds. No wheezing or rales.  Abdominal:     General: Bowel sounds are normal. There is no distension.     Palpations: Abdomen is soft.     Tenderness: There is no abdominal tenderness. There is no rebound.  Skin:    General: Skin is warm and dry.  Neurological:     Mental Status: She is alert and oriented to person, place, and time.     Coordination: Coordination normal.     Vitals:   09/02/18 0903  BP: 90/64  Pulse: 69  Temp: 97.6 F (36.4 C)  TempSrc: Oral  SpO2: 99%  Weight: 99 lb  (44.9 kg)  Height: 5\' 5"  (1.651 m)   EKG: Rate 68, short PR, sinus, axis normal, no st or t wave changes, when compared to prior 03/2018 no significant change  Assessment & Plan:

## 2018-09-02 NOTE — Assessment & Plan Note (Signed)
Overall low risk for surgical procedure. EKG done in office and unchanged. Checking CBC and CMP and will fax to her surgeon. Advised her about the likelihood that her procedure will be postponed due to coronavirus outbreak. She does not have symptoms of coronavirus currently. She is in moderate risk category. Age >69 but no cardiac disease or other chronic illnesses.

## 2018-09-02 NOTE — Patient Instructions (Signed)
We will get the blood work today and clear you for surgery.

## 2018-09-13 ENCOUNTER — Other Ambulatory Visit: Payer: Self-pay | Admitting: Internal Medicine

## 2018-09-13 NOTE — Telephone Encounter (Signed)
Called pharmacy last refill 06/15/2018 90tabs LOV: 03/19/2018 cpe NOV: none

## 2018-11-03 ENCOUNTER — Ambulatory Visit (INDEPENDENT_AMBULATORY_CARE_PROVIDER_SITE_OTHER): Payer: Medicare Other | Admitting: Internal Medicine

## 2018-11-03 ENCOUNTER — Encounter: Payer: Self-pay | Admitting: Internal Medicine

## 2018-11-03 DIAGNOSIS — R21 Rash and other nonspecific skin eruption: Secondary | ICD-10-CM | POA: Diagnosis not present

## 2018-11-03 MED ORDER — DOXYCYCLINE HYCLATE 100 MG PO TABS: 200.0000 mg | ORAL_TABLET | Freq: Once | ORAL | 0 refills | Status: AC

## 2018-11-03 NOTE — Progress Notes (Signed)
Virtual Visit via Video Note  I connected with Cheryl Lawson on 11/03/18 at 10:20 AM EDT by a video enabled telemedicine application and verified that I am speaking with the correct person using two identifiers.  The patient and the provider were at separate locations throughout the entire encounter.   I discussed the limitations of evaluation and management by telemedicine and the availability of in person appointments. The patient expressed understanding and agreed to proceed.  History of Present Illness: The patient is a 68 y.o. female with visit for tick bite and rash. 5/17 tick was pulled off and she is not sure when it got on. [place] some. Started rash since pulling off the tick. Denies fevers or chills. Has no target look to the lesion. Overall it is worsening. Has tried nothing  Observations/Objective: Appearance: normal, breathing appears normal, casual grooming, abdomen does not appear distended, throat normal, tick bite with red rash surrounding, not target looking, mental status is A and O times 3  Assessment and Plan: See problem oriented charting  Follow Up Instructions: rx doxycycline 200 mg once, with extra pills as she is likely to get more tick bites through the season  I discussed the assessment and treatment plan with the patient. The patient was provided an opportunity to ask questions and all were answered. The patient agreed with the plan and demonstrated an understanding of the instructions.   The patient was advised to call back or seek an in-person evaluation if the symptoms worsen or if the condition fails to improve as anticipated.  Hoyt Koch, MD

## 2018-11-03 NOTE — Assessment & Plan Note (Signed)
Due to tick bite and given unknown duration of attachment and removal within 72 hours will give ppx doxycycline 200 mg once. Rx for 14 pills and instructions for when to take as she is in high risk environment.

## 2018-11-09 ENCOUNTER — Ambulatory Visit (INDEPENDENT_AMBULATORY_CARE_PROVIDER_SITE_OTHER): Payer: Medicare Other | Admitting: Internal Medicine

## 2018-11-09 ENCOUNTER — Telehealth: Payer: Self-pay | Admitting: Internal Medicine

## 2018-11-09 ENCOUNTER — Encounter: Payer: Self-pay | Admitting: Internal Medicine

## 2018-11-09 ENCOUNTER — Other Ambulatory Visit: Payer: Self-pay

## 2018-11-09 DIAGNOSIS — K6289 Other specified diseases of anus and rectum: Secondary | ICD-10-CM | POA: Diagnosis not present

## 2018-11-09 MED ORDER — HYDROCORTISONE (PERIANAL) 2.5 % EX CREA: 1.0000 "application " | TOPICAL_CREAM | Freq: Two times a day (BID) | CUTANEOUS | 0 refills | Status: DC

## 2018-11-09 NOTE — Telephone Encounter (Signed)
Patient called [facility] 5/23 at 6:36pm.  States she had a piece of stool stuck in her anus a few days ago and it irritated her and she removed it.  Then patient discovered she has 2 raised bumps on her anus which she thought did not look right.  States it was puffy and red.  Bumps are about 1 cm in diameter.  Patient was advised to see PCP in 24 hours.  Please advise.

## 2018-11-09 NOTE — Telephone Encounter (Signed)
Patient is getting scheduled by carson

## 2018-11-09 NOTE — Progress Notes (Signed)
   Subjective:   Patient ID: Cheryl Lawson, female    DOB: 1951/01/13, 68 y.o.   MRN: [id number]  HPI The patient is a 68 YO female coming in for anal irritation and bumps. Started about 1-2 weeks ago. Denies trauma. Did have some hard piece of feces which was stuck in the anus and she removed at onset. She denies blood in stool or pain with defecation. She is having discomfort with sitting and walking since that time about 1-2 weeks ago. Overall is stable. Denies constipation or diarrhea. Has tried neosporin which did not help. Denies fevers or chills or cough or SOB. Last colonoscopy 2013 without findings and not due until 2023.  Review of Systems  Constitutional: Negative.   HENT: Negative.   Eyes: Negative.   Respiratory: Negative for cough, chest tightness and shortness of breath.   Cardiovascular: Negative for chest pain, palpitations and leg swelling.  Gastrointestinal: Positive for rectal pain. Negative for abdominal distention, abdominal pain, anal bleeding, blood in stool, constipation, diarrhea, nausea and vomiting.  Musculoskeletal: Negative.   Skin: Negative.   Neurological: Negative.     Objective:  Physical Exam Constitutional:      Appearance: She is well-developed.  HENT:     Head: Normocephalic and atraumatic.  Neck:     Musculoskeletal: Normal range of motion.  Cardiovascular:     Rate and Rhythm: Normal rate and regular rhythm.  Pulmonary:     Effort: Pulmonary effort is normal. No respiratory distress.     Breath sounds: Normal breath sounds. No wheezing or rales.  Abdominal:     General: Bowel sounds are normal. There is no distension.     Palpations: Abdomen is soft.     Tenderness: There is no abdominal tenderness. There is no rebound.  Genitourinary:    Comments: Anus with some irritation at the 12 o clock, no tear appreciated, no abscess appreciated or cellulitis Skin:    General: Skin is warm and dry.  Neurological:     Mental Status: She is alert  and oriented to person, place, and time.     Coordination: Coordination normal.     Vitals:   11/09/18 1035  BP: 100/80  Pulse: 80  Temp: 98.3 F (36.8 C)  TempSrc: Oral  SpO2: 99%  Weight: 102 lb (46.3 kg)  Height: 5\' 5"  (1.651 m)   Assessment & Plan:

## 2018-11-09 NOTE — Patient Instructions (Addendum)
We have sent in ointment to use twice a day

## 2018-11-09 NOTE — Assessment & Plan Note (Signed)
Rx anu-sol to use for irritation. No antibiotic indicated at this time. No hemorrhoids on last colonoscopy and no concerning features.

## 2018-11-15 ENCOUNTER — Encounter: Payer: Self-pay | Admitting: Internal Medicine

## 2018-11-15 ENCOUNTER — Telehealth: Payer: Self-pay | Admitting: Internal Medicine

## 2018-11-15 NOTE — Telephone Encounter (Signed)
Copied from [place] [id number]. Topic: Quick Communication - Rx Refill/Question >> Nov 15, 2018  9:52 AM Erick Blinks wrote: Medication: hydrocortisone (ANUSOL-HC) 2.5 % rectal cream [[id number]]  Pt called to report that medication has not relieved her symptoms/discomfort. She is requesting call back from Nurse or PCP. Please advise.

## 2018-11-15 NOTE — Telephone Encounter (Signed)
Addressed via mychart

## 2018-11-18 ENCOUNTER — Encounter: Payer: Self-pay | Admitting: Internal Medicine

## 2018-11-18 DIAGNOSIS — K6289 Other specified diseases of anus and rectum: Secondary | ICD-10-CM

## 2018-12-07 ENCOUNTER — Ambulatory Visit (INDEPENDENT_AMBULATORY_CARE_PROVIDER_SITE_OTHER): Payer: Medicare Other | Admitting: Nurse Practitioner

## 2018-12-07 ENCOUNTER — Encounter: Payer: Self-pay | Admitting: Nurse Practitioner

## 2018-12-07 VITALS — BP 114/76 | HR 96 | Temp 98.4°F | Ht 65.0 in | Wt 100.2 lb

## 2018-12-07 DIAGNOSIS — L290 Pruritus ani: Secondary | ICD-10-CM

## 2018-12-07 NOTE — Patient Instructions (Signed)
If you are age 66 or older, your body mass index should be between 23-30. Your Body mass index is 16.68 kg/m. If this is out of the aforementioned range listed, please consider follow up with your Primary Care Provider.  If you are age 99 or younger, your body mass index should be between 19-25. Your Body mass index is 16.68 kg/m. If this is out of the aformentioned range listed, please consider follow up with your Primary Care Provider.   Follow up as needed.  Thank you for choosing me and [facility].   Tye Savoy, NP

## 2018-12-07 NOTE — Progress Notes (Signed)
ASSESSMENT / PLAN:   68 year old female with perianal irritation over the last month.  She tried steroid cream for couple of weeks without improvement.  Minimal perianal erythema,  possibly a very superficial perianal fissure near the posterior midline seen on exam today. -At this point I do not think she needs any specific treatment other than to keep the area clean and dry.  I told the patient I was happy to see her back at any time if she continued to have perianal discomfort and/or develops any perianal lesions   HPI:    Chief Complaint:  Perianal rash   Patient is a 68 year old female known remotely to Dr. Ardis Hughs from screening colonoscopy in 2013.  She is here with 1 month history of perianal irritation.  This all started about a month ago when she noted some mild perianal irritation.  She subsequently felt a small perianal bump and upon further evaluation realized that it was a small piece of stool which had gotten trapped in 1 of the perianal skin folds.  There were some small raised bumps around the area.  Because of discomfort her PCP prescribed steroid cream.  Patient did not see any improvement so she discontinued after 2 weeks.   Patient's bowel movements have been normal.  She has not had any rectal bleeding.  She feels like her perianal area has not gotten back to normal due to some lingering raised areas and burning.  No other GI complaints.  No general medical complaints.  Past Medical History:  Diagnosis Date  . Anxiety   . Breast cancer ([place])   . Depression     Past Surgical History:  Procedure Laterality Date  . BREAST LUMPECTOMY     right  . FACIAL COSMETIC SURGERY    . infertility surgery    . NASAL SEPTUM SURGERY    . NECK SURGERY     lift   . TONSILLECTOMY     Family History  Problem Relation Age of Onset  . Ovarian cancer Mother   . Colon cancer Mother    Social History   Tobacco Use  . Smoking status: Never Smoker  . Smokeless  tobacco: Never Used  Substance Use Topics  . Alcohol use: Yes    Comment: rare  . Drug use: No   Current Outpatient Medications  Medication Sig Dispense Refill  . ALPRAZolam (XANAX) 0.25 MG tablet TAKE 1 TABLET BY MOUTH AT BEDTIME AS NEEDED FOR SLEEP OR ANXIETY (Patient taking differently: 0.25 mg as needed. ) 30 tablet 3  . Calcium Carbonate-Vit D-Min (CALCIUM 1200 PO) Take by mouth.    . diphenhydrAMINE HCl, Sleep, (ZZZQUIL PO) Take 1 tablet by mouth at bedtime.    . minoxidil (ROGAINE) 2 % external solution Apply 1 application topically at bedtime. Put in 3 places    . Multiple Vitamin (MULTIVITAMIN WITH MINERALS) TABS tablet Take 1 tablet by mouth daily. (Patient taking differently: Take 1 tablet by mouth daily. Celebrate)    . Multiple Vitamins-Minerals (HAIR VITAMINS PO) Take 2 tablets by mouth daily.     No current facility-administered medications for this visit.    No Known Allergies   Review of Systems: All systems reviewed and negative except where noted in HPI.   Creatinine clearance cannot be calculated (Patient's most recent lab result is older than the maximum 21 days allowed.)   Physical Exam:    Wt  Readings from Last 3 Encounters:  12/07/18 100 lb 4 oz (45.5 kg)  11/09/18 102 lb (46.3 kg)  09/02/18 99 lb (44.9 kg)    BP 114/76   Pulse 96   Temp 98.4 F (36.9 C)   Ht 5\' 5"  (1.651 m)   Wt 100 lb 4 oz (45.5 kg)   BMI 16.68 kg/m  Constitutional:  Pleasant thin emale in no acute distress. Psychiatric: Normal mood and affect. Behavior is normal. EENT: Pupils normal.  Conjunctivae are normal. No scleral icterus. Abdominal: Soft, nondistended, nontender. Bowel sounds active throughout. There are no masses palpable.  Rectal: Very minimal perianal irritation, possibly a superficial fissure near but not a posterior midline.No fistulas.  No lesions on DRE. On anoscopy there was a very small internal hemorrhoid Neurological: Alert and oriented to person place and  time. Skin: Skin is warm and dry. No rashes noted.  Tye Savoy, NP  12/07/2018, 5:31 PM  Cc: Hoyt Koch, *

## 2018-12-08 NOTE — Progress Notes (Signed)
I agree with the above note, plan 

## 2018-12-13 ENCOUNTER — Telehealth: Payer: Self-pay | Admitting: Nurse Practitioner

## 2018-12-13 NOTE — Telephone Encounter (Signed)
Pt stated that her anal irritation is getting worse and is concerned.  She requested a call back to advise.

## 2018-12-13 NOTE — Telephone Encounter (Signed)
Left message on machine to call back  

## 2018-12-14 ENCOUNTER — Encounter: Payer: Self-pay | Admitting: Nurse Practitioner

## 2018-12-14 ENCOUNTER — Telehealth: Payer: Self-pay

## 2018-12-14 ENCOUNTER — Ambulatory Visit (INDEPENDENT_AMBULATORY_CARE_PROVIDER_SITE_OTHER): Payer: Medicare Other | Admitting: Nurse Practitioner

## 2018-12-14 VITALS — BP 102/78 | HR 87 | Temp 97.9°F | Ht 65.0 in | Wt 99.0 lb

## 2018-12-14 DIAGNOSIS — L290 Pruritus ani: Secondary | ICD-10-CM | POA: Diagnosis not present

## 2018-12-14 DIAGNOSIS — K602 Anal fissure, unspecified: Secondary | ICD-10-CM | POA: Diagnosis not present

## 2018-12-14 MED ORDER — AMBULATORY NON FORMULARY MEDICATION: 1 refills | Status: DC

## 2018-12-14 NOTE — Progress Notes (Signed)
       Chief Complaint:   Ongoing rectal discomfort and "bumps" around rectum   IMPRESSION and PLAN:    68 yo female with several week history of perianal irritation / discomfort. Nothing on DRE or anoscopy except what appeared to be a very superficial anterior midline tear.  -Start daily Benefiber -Trial of Diltiazem gel 2% TID x 6 weeks. Apply as directed in clinic today. -Kegel exercise information given -She should call with update in a few weeks.      HPI:     Patient is a 68 yo female who I saw a few days ago with complaints of perianal discomfort and perianal 'bumps". [place] [facility] [place] [facility] [place] but apparently no reason for discomfort was found. Then saw PCP who described some irritation at  12 o'clock but no tear or other abnormalities appreciated. She was prescribed topical steroid cream with didn't help. I was unable to appreciate anything on exam except mild irritation in anterior midline area. On anoscopy there was a very small internal hemorrhoid. I asked her to monitor things for now, keep area clean and dry. She has called back with ongoing irritation and concerns about "bumps".    Past Medical History:  Diagnosis Date  . Anxiety   . Breast cancer ([place])   . Depression     Patient's surgical history, family medical history, social history, medications and allergies were all reviewed in Epic   Creatinine clearance cannot be calculated (Patient's most recent lab result is older than the maximum 21 days allowed.)  Current Outpatient Medications  Medication Sig Dispense Refill  . ALPRAZolam (XANAX) 0.25 MG tablet TAKE 1 TABLET BY MOUTH AT BEDTIME AS NEEDED FOR SLEEP OR ANXIETY (Patient taking differently: 0.25 mg as needed. ) 30 tablet 3  . Calcium Carbonate-Vit D-Min (CALCIUM 1200 PO) Take 2 tablets by mouth daily.     . diphenhydrAMINE HCl, Sleep, (ZZZQUIL PO) Take 1 tablet by mouth at bedtime. With melatonin    . minoxidil (ROGAINE) 2 %  external solution Apply 1 application topically at bedtime. Put in 3 places    . Multiple Vitamin (MULTIVITAMIN WITH MINERALS) TABS tablet Take 1 tablet by mouth daily. (Patient taking differently: Take 1 tablet by mouth daily. Celebrate)    . Multiple Vitamins-Minerals (HAIR VITAMINS PO) Take 2 tablets by mouth daily.    . AMBULATORY NON FORMULARY MEDICATION Diltiazem Gel 2 % Apply pea sized amount three times daily as directed.  Use for 6 weeks. 30 g 1   No current facility-administered medications for this visit.     Physical Exam:     BP 102/78   Pulse 87   Temp 97.9 F (36.6 C)   Ht 5\' 5"  (1.651 m)   Wt 99 lb (44.9 kg)   BMI 16.47 kg/m   GENERAL:  Pleasant female in NAD PSYCH: : Cooperative, normal affect RECTAL. Small perianal fleshy skin tag. A very superficial anterior midline fissure with mild surrounding irritation. I asked Dr. Silverio Decamp to examine patient as I felt like symptoms didn't correlate with exam. Rectal exam with anoscopy by Dr. Silverio Decamp was basically normal except for mild anterior midline fissure.   Tye Savoy , NP 12/14/2018, 2:26 PM

## 2018-12-14 NOTE — Patient Instructions (Signed)
If you are age 66 or older, your body mass index should be between 23-30. Your Body mass index is 16.47 kg/m. If this is out of the aforementioned range listed, please consider follow up with your Primary Care Provider.  If you are age 93 or younger, your body mass index should be between 19-25. Your Body mass index is 16.47 kg/m. If this is out of the aformentioned range listed, please consider follow up with your Primary Care Provider.   We have sent a prescription for Diltiazem Gel  2 % to [facility]. You should apply a pea size amount to your rectum three times daily x 6-8 weeks.  [facility]'s information is below: Address: [address], [place], [place] [address]  [phone]  *Please DO NOT go directly from our office to pick up this medication! Give the pharmacy 1 day to process the prescription as this is compounded at takes time to make.  Start Benefiber daily.  Follow up if not improving.  Thank you for choosing me and [facility].   Tye Savoy, NP

## 2018-12-14 NOTE — Telephone Encounter (Signed)
Covid-19 screening questions   Do you now or have you had a fever in the last 14 days? No  Do you have any respiratory symptoms of shortness of breath or cough now or in the last 14 days? No  Do you have any family members or close contacts with diagnosed or suspected Covid-19 in the past 14 days? no  Have you been tested for Covid-19 and found to be positive? No        

## 2018-12-16 NOTE — Telephone Encounter (Signed)
Left message on machine to call back  

## 2019-01-06 NOTE — Progress Notes (Signed)
Reviewed and agree with documentation and assessment and plan. K. Veena Thera Basden , MD   

## 2019-01-10 DIAGNOSIS — S0512XA Contusion of eyeball and orbital tissues, left eye, initial encounter: Secondary | ICD-10-CM | POA: Diagnosis not present

## 2019-01-28 ENCOUNTER — Telehealth: Payer: Self-pay | Admitting: Nurse Practitioner

## 2019-01-28 NOTE — Telephone Encounter (Signed)
Pt was seen recently for anal fissure.  She would like to be referred to [facility] "to have it lasered off."

## 2019-01-31 NOTE — Telephone Encounter (Signed)
That is reasonable, thanks

## 2019-01-31 NOTE — Telephone Encounter (Signed)
Referral faxed to [facility] 

## 2019-01-31 NOTE — Telephone Encounter (Signed)
Pt calling requesting referral to [facility] for perianal fissure/skin tags. Please advise if ok to refer to [facility].

## 2019-03-21 ENCOUNTER — Other Ambulatory Visit: Payer: Self-pay | Admitting: Internal Medicine

## 2019-03-21 DIAGNOSIS — K6289 Other specified diseases of anus and rectum: Secondary | ICD-10-CM | POA: Diagnosis not present

## 2019-03-21 NOTE — Telephone Encounter (Signed)
Control database checked last refill: 12/09/2018 30 tabs [id number] 11/09/2018 [id number]

## 2019-07-11 DIAGNOSIS — Z1231 Encounter for screening mammogram for malignant neoplasm of breast: Secondary | ICD-10-CM | POA: Diagnosis not present

## 2019-07-11 DIAGNOSIS — Z923 Personal history of irradiation: Secondary | ICD-10-CM | POA: Diagnosis not present

## 2019-07-11 DIAGNOSIS — Z08 Encounter for follow-up examination after completed treatment for malignant neoplasm: Secondary | ICD-10-CM | POA: Diagnosis not present

## 2019-07-11 DIAGNOSIS — Z853 Personal history of malignant neoplasm of breast: Secondary | ICD-10-CM | POA: Diagnosis not present

## 2019-07-11 DIAGNOSIS — Z9882 Breast implant status: Secondary | ICD-10-CM | POA: Diagnosis not present

## 2019-07-11 DIAGNOSIS — Z9223 Personal history of estrogen therapy: Secondary | ICD-10-CM | POA: Diagnosis not present

## 2019-08-03 ENCOUNTER — Encounter: Payer: Self-pay | Admitting: Internal Medicine

## 2019-08-03 ENCOUNTER — Other Ambulatory Visit: Payer: Self-pay

## 2019-08-03 ENCOUNTER — Ambulatory Visit (INDEPENDENT_AMBULATORY_CARE_PROVIDER_SITE_OTHER): Payer: Medicare Other | Admitting: Internal Medicine

## 2019-08-03 VITALS — BP 126/84 | HR 84 | Temp 97.8°F | Ht 65.0 in | Wt 100.0 lb

## 2019-08-03 DIAGNOSIS — Z13220 Encounter for screening for lipoid disorders: Secondary | ICD-10-CM

## 2019-08-03 DIAGNOSIS — Z01810 Encounter for preprocedural cardiovascular examination: Secondary | ICD-10-CM

## 2019-08-03 LAB — LIPID PANEL
Cholesterol: 188 mg/dL (ref 0–200)
HDL: 67.4 mg/dL (ref >39.00)
LDL Cholesterol: 101 mg/dL — ABNORMAL HIGH (ref 0–99)
NonHDL: 120.93
Total CHOL/HDL Ratio: 3
Triglycerides: 102 mg/dL (ref 0.0–149.0)
VLDL: 20.4 mg/dL (ref 0.0–40.0)

## 2019-08-03 LAB — COMPREHENSIVE METABOLIC PANEL
ALT: 18 U/L (ref 0–35)
AST: 34 U/L (ref 0–37)
Albumin: 4.5 g/dL (ref 3.5–5.2)
Alkaline Phosphatase: 56 U/L (ref 39–117)
BUN: 10 mg/dL (ref 6–23)
CO2: 32 mEq/L (ref 19–32)
Calcium: 9.9 mg/dL (ref 8.4–10.5)
Chloride: 103 mEq/L (ref 96–112)
Creatinine, Ser: 0.9 mg/dL (ref 0.40–1.20)
GFR: 62.59 mL/min (ref >60.00)
Glucose, Bld: 102 mg/dL — ABNORMAL HIGH (ref 70–99)
Potassium: 4.9 mEq/L (ref 3.5–5.1)
Sodium: 141 mEq/L (ref 135–145)
Total Bilirubin: 0.8 mg/dL (ref 0.2–1.2)
Total Protein: 6.7 g/dL (ref 6.0–8.3)

## 2019-08-03 LAB — CBC
HCT: 44.4 % (ref 36.0–46.0)
Hemoglobin: 15.1 g/dL — ABNORMAL HIGH (ref 12.0–15.0)
MCHC: 34 g/dL (ref 30.0–36.0)
MCV: 92.3 fl (ref 78.0–100.0)
Platelets: 158 10*3/uL (ref 150.0–400.0)
RBC: 4.81 Mil/uL (ref 3.87–5.11)
RDW: 13 % (ref 11.5–15.5)
WBC: 3.1 10*3/uL — ABNORMAL LOW (ref 4.0–10.5)

## 2019-08-03 NOTE — Patient Instructions (Addendum)
COVID-19 Vaccine Information can be found at: [email or web address] For questions related to vaccine distribution or appointments, please email [email or web address] or call [phone].    

## 2019-08-03 NOTE — Progress Notes (Addendum)
   Subjective:   Patient ID: Cheryl Lawson, female    DOB: January 19, 1951, 69 y.o.   MRN: [id number]  HPI The patient is a 69 YO female coming in for pre-op cardiovascular exam. She is having some plastic surgery done with general anaesthesia. Denies chest pains or SOB. Does exercise still about 20-30 minutes per day. Denies SOB with that or chest pains. Denies abdominal pain, diarrhea, constipation. Denies change in medical history. Overall more stressed than usual due to poor health currently of her spouse.   PMH, [facility], social history reviewed and updated.   Review of Systems  Constitutional: Negative.   HENT: Negative.   Eyes: Negative.   Respiratory: Negative for cough, chest tightness and shortness of breath.   Cardiovascular: Negative for chest pain, palpitations and leg swelling.  Gastrointestinal: Negative for abdominal distention, abdominal pain, constipation, diarrhea, nausea and vomiting.  Musculoskeletal: Negative.   Skin: Negative.   Neurological: Negative.   Psychiatric/Behavioral: Negative.     Objective:  Physical Exam Constitutional:      Appearance: She is well-developed.  HENT:     Head: Normocephalic and atraumatic.  Cardiovascular:     Rate and Rhythm: Normal rate and regular rhythm.  Pulmonary:     Effort: Pulmonary effort is normal. No respiratory distress.     Breath sounds: Normal breath sounds. No wheezing or rales.  Abdominal:     General: Bowel sounds are normal. There is no distension.     Palpations: Abdomen is soft.     Tenderness: There is no abdominal tenderness. There is no rebound.  Musculoskeletal:     Cervical back: Normal range of motion.  Skin:    General: Skin is warm and dry.  Neurological:     Mental Status: She is alert and oriented to person, place, and time.     Coordination: Coordination normal.     Vitals:   08/03/19 0933  BP: 126/84  Pulse: 84  Temp: 97.8 F (36.6 C)  TempSrc: Oral  SpO2: 97%  Weight: 100 lb (45.4 kg)    Height: 5\' 5"  (1.651 m)   EKG: Rate 65, axis normal, intervals normal, sinus, no st or t wave changes, no significant change from 2020.   This visit occurred during the SARS-CoV-2 public health emergency.  Safety protocols were in place, including screening questions prior to the visit, additional usage of staff PPE, and extensive cleaning of exam room while observing appropriate contact time as indicated for disinfecting solutions.   Assessment & Plan:  Visit time 20 minutes in face to face communication with patient and coordination of care, additional 10 minutes spent in record review, coordination or care, ordering tests, communicating/referring to other healthcare professionals, documenting in medical records all on the same day of the visit for total time 30 minutes spent on the visit.

## 2019-08-04 NOTE — Assessment & Plan Note (Signed)
EKG done without changes from prior, labs will be ordered per request from surgeon. Forms and letter of clearance to be done and faxed in when lab results return. She is deemed low risk for surgical procedure upcoming and no further cardiac testing is recommended.

## 2019-08-07 ENCOUNTER — Ambulatory Visit: Payer: Medicare Other | Attending: Internal Medicine

## 2019-08-07 DIAGNOSIS — Z23 Encounter for immunization: Secondary | ICD-10-CM | POA: Insufficient documentation

## 2019-08-07 NOTE — Progress Notes (Signed)
   Covid-19 Vaccination Clinic  Name:  VONA RAFANAN    MRN: [id number] DOB: [birth year]  08/07/2019  Ms. Maceda was observed post Covid-19 immunization for 15 minutes without incidence. She was provided with Vaccine Information Sheet and instruction to access the V-Safe system.   Ms. Corne was instructed to call 911 with any severe reactions post vaccine: Marland Kitchen Difficulty breathing  . Swelling of your face and throat  . A fast heartbeat  . A bad rash all over your body  . Dizziness and weakness    Immunizations Administered    Name Date Dose VIS Date Route   Pfizer COVID-19 Vaccine 08/07/2019 11:41 AM 0.3 mL 05/27/2019 Intramuscular   Manufacturer: [place]   Lot: [id number]   [place]: [id number]

## 2019-08-17 ENCOUNTER — Telehealth: Payer: Self-pay | Admitting: Nurse Practitioner

## 2019-08-17 NOTE — Telephone Encounter (Signed)
Patient is calling states she was previously referred to [facility] and she would like to get referred to them again to get re-evaluated.

## 2019-08-18 NOTE — Telephone Encounter (Signed)
Patient states the area of the fissure has become a "cavity" and becomes sore at times. She is unable to clean the area. It has become very concerning to her. She last saw Dr Marcello Moores. I have helped her arrange an appointment to go back to discuss with the surgeon on 08/30/19 at 11:00 am.

## 2019-08-22 DIAGNOSIS — Z1152 Encounter for screening for COVID-19: Secondary | ICD-10-CM | POA: Diagnosis not present

## 2019-08-31 ENCOUNTER — Ambulatory Visit: Payer: Self-pay

## 2019-08-31 ENCOUNTER — Ambulatory Visit: Payer: Medicare Other | Attending: Internal Medicine

## 2019-08-31 DIAGNOSIS — Z23 Encounter for immunization: Secondary | ICD-10-CM

## 2019-08-31 NOTE — Progress Notes (Signed)
   Covid-19 Vaccination Clinic  Name:  SONORA MCCLENNEY    MRN: [id number] DOB: 1950/08/06  08/31/2019  Ms. Berkland was observed post Covid-19 immunization for 15 minutes without incident. She was provided with Vaccine Information Sheet and instruction to access the V-Safe system.   Ms. Bowen was instructed to call 911 with any severe reactions post vaccine: Marland Kitchen Difficulty breathing  . Swelling of face and throat  . A fast heartbeat  . A bad rash all over body  . Dizziness and weakness   Immunizations Administered    Name Date Dose VIS Date Route   Pfizer COVID-19 Vaccine 08/31/2019  8:40 AM 0.3 mL 05/27/2019 Intramuscular   Manufacturer: [place]   Lot: [id number]   [place]: [id number]

## 2019-09-06 ENCOUNTER — Ambulatory Visit: Payer: Self-pay

## 2019-09-12 DIAGNOSIS — K6289 Other specified diseases of anus and rectum: Secondary | ICD-10-CM | POA: Diagnosis not present

## 2019-09-22 ENCOUNTER — Other Ambulatory Visit: Payer: Self-pay | Admitting: Internal Medicine

## 2019-10-25 ENCOUNTER — Encounter: Payer: Self-pay | Admitting: Internal Medicine

## 2019-10-25 ENCOUNTER — Other Ambulatory Visit: Payer: Self-pay

## 2019-10-25 ENCOUNTER — Ambulatory Visit (INDEPENDENT_AMBULATORY_CARE_PROVIDER_SITE_OTHER): Payer: Medicare Other | Admitting: Internal Medicine

## 2019-10-25 VITALS — BP 110/76 | HR 78 | Temp 98.5°F | Ht 65.0 in | Wt 97.0 lb

## 2019-10-25 DIAGNOSIS — F418 Other specified anxiety disorders: Secondary | ICD-10-CM | POA: Diagnosis not present

## 2019-10-25 MED ORDER — CLOBETASOL PROPIONATE 0.05 % EX SOLN: 1.0000 "application " | Freq: Two times a day (BID) | CUTANEOUS | 0 refills | Status: DC

## 2019-10-25 MED ORDER — DULOXETINE HCL 30 MG PO CPEP: 30.0000 mg | ORAL_CAPSULE | Freq: Every day | ORAL | 3 refills | Status: DC

## 2019-10-25 NOTE — Patient Instructions (Signed)
We have sent in the cymbalta to take 1 pill daily.

## 2019-10-25 NOTE — Progress Notes (Signed)
   Subjective:   Patient ID: Cheryl Lawson, female    DOB: [birth year], 69 y.o.   MRN: [id number]  HPI The patient is a 68 YO female coming in for concerns about depression/anxiety. 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 Had a surgery on April 5th and this has had problems in the last 2 weeks. This is very disappointing to her as she was hoping that this would be able to help fix the muscle problem with the implant. She does have some pain in the area. Her relative thinks she should start cymbalta. She does have a lot of crying spells still and related to these issues.   Review of Systems  Constitutional: Negative.   HENT: Negative.   Eyes: Negative.   Respiratory: Negative for cough, chest tightness and shortness of breath.   Cardiovascular: Negative for chest pain, palpitations and leg swelling.  Gastrointestinal: Negative for abdominal distention, abdominal pain, constipation, diarrhea, nausea and vomiting.  Musculoskeletal: Negative.   Skin: Negative.   Neurological: Negative.   Psychiatric/Behavioral: Positive for decreased concentration and sleep disturbance.    Objective:  Physical Exam Constitutional:      Appearance: She is well-developed.  HENT:     Head: Normocephalic and atraumatic.  Cardiovascular:     Rate and Rhythm: Normal rate and regular rhythm.  Pulmonary:     Effort: Pulmonary effort is normal. No respiratory distress.     Breath sounds: Normal breath sounds. No wheezing or rales.  Abdominal:     General: Bowel sounds are normal. There is no distension.     Palpations: Abdomen is soft.     Tenderness: There is no abdominal tenderness. There is no rebound.  Musculoskeletal:     Cervical back: Normal range of motion.  Skin:    General: Skin is warm and dry.  Neurological:     Mental Status: She is alert and oriented to person, place, and time.     Coordination: Coordination normal.     Vitals:   10/25/19 1334  BP: 110/76  Pulse: 78  Temp: 98.5 F (36.9 C)  SpO2: 99%  Weight: 97 lb (44 kg)  Height: 5\' 5"  (1.651 m)    This visit occurred during the SARS-CoV-2 public health emergency.  Safety protocols were in place, including screening questions prior to the visit, additional usage of staff PPE, and extensive cleaning of exam room while observing appropriate contact time as indicated for disinfecting solutions.   Assessment & Plan:

## 2019-10-26 NOTE — Assessment & Plan Note (Signed)
Rx cymbalta 30 mg daily to try for symptoms.

## 2019-10-28 ENCOUNTER — Other Ambulatory Visit: Payer: Self-pay | Admitting: Internal Medicine

## 2019-10-28 NOTE — Telephone Encounter (Signed)
Check [place] registry last filled 09/22/2019. MD is out of the office today pls advise.Marland KitchenJohny Chess

## 2019-11-15 DIAGNOSIS — K644 Residual hemorrhoidal skin tags: Secondary | ICD-10-CM | POA: Diagnosis not present

## 2019-11-16 ENCOUNTER — Other Ambulatory Visit: Payer: Self-pay | Admitting: Internal Medicine

## 2019-11-18 NOTE — Telephone Encounter (Signed)
Should not have 90 day. She is supposed to contact us after 30 days and dosage would be likely changed at that time as this is starting dose and not typical dose.

## 2019-11-22 DIAGNOSIS — L820 Inflamed seborrheic keratosis: Secondary | ICD-10-CM | POA: Diagnosis not present

## 2019-11-22 DIAGNOSIS — L905 Scar conditions and fibrosis of skin: Secondary | ICD-10-CM | POA: Diagnosis not present

## 2019-11-22 DIAGNOSIS — D1801 Hemangioma of skin and subcutaneous tissue: Secondary | ICD-10-CM | POA: Diagnosis not present

## 2019-11-22 DIAGNOSIS — L821 Other seborrheic keratosis: Secondary | ICD-10-CM | POA: Diagnosis not present

## 2019-11-22 DIAGNOSIS — D225 Melanocytic nevi of trunk: Secondary | ICD-10-CM | POA: Diagnosis not present

## 2019-11-22 DIAGNOSIS — Z85828 Personal history of other malignant neoplasm of skin: Secondary | ICD-10-CM | POA: Diagnosis not present

## 2019-12-12 ENCOUNTER — Emergency Department (HOSPITAL_BASED_OUTPATIENT_CLINIC_OR_DEPARTMENT_OTHER): Payer: Medicare Other

## 2019-12-12 ENCOUNTER — Emergency Department (HOSPITAL_BASED_OUTPATIENT_CLINIC_OR_DEPARTMENT_OTHER)
Admission: EM | Admit: 2019-12-12 | Discharge: 2019-12-12 | Disposition: A | Discharge status: Home / Self Care | Payer: Medicare Other | Attending: Emergency Medicine | Admitting: Emergency Medicine

## 2019-12-12 ENCOUNTER — Encounter (HOSPITAL_BASED_OUTPATIENT_CLINIC_OR_DEPARTMENT_OTHER): Payer: Self-pay | Admitting: *Deleted

## 2019-12-12 ENCOUNTER — Other Ambulatory Visit: Payer: Self-pay

## 2019-12-12 DIAGNOSIS — N644 Mastodynia: Secondary | ICD-10-CM | POA: Diagnosis present

## 2019-12-12 DIAGNOSIS — Z853 Personal history of malignant neoplasm of breast: Secondary | ICD-10-CM | POA: Diagnosis not present

## 2019-12-12 LAB — BASIC METABOLIC PANEL
Anion gap: 8 (ref 5–15)
BUN: 15 mg/dL (ref 8–23)
CO2: 30 mmol/L (ref 22–32)
Calcium: 9.2 mg/dL (ref 8.9–10.3)
Chloride: 104 mmol/L (ref 98–111)
Creatinine, Ser: 0.85 mg/dL (ref 0.44–1.00)
GFR calc Af Amer: >60 mL/min (ref >60)
GFR calc non Af Amer: >60 mL/min (ref >60)
Glucose, Bld: 90 mg/dL (ref 70–99)
Potassium: 5.1 mmol/L (ref 3.5–5.1)
Sodium: 142 mmol/L (ref 135–145)

## 2019-12-12 LAB — CBC WITH DIFFERENTIAL/PLATELET
Abs Immature Granulocytes: 0.01 10*3/uL (ref 0.00–0.07)
Basophils Absolute: 0 10*3/uL (ref 0.0–0.1)
Basophils Relative: 1 %
Eosinophils Absolute: 0.2 10*3/uL (ref 0.0–0.5)
Eosinophils Relative: 5 %
HCT: 43.9 % (ref 36.0–46.0)
Hemoglobin: 14.2 g/dL (ref 12.0–15.0)
Immature Granulocytes: 0 %
Lymphocytes Relative: 20 %
Lymphs Abs: 0.8 10*3/uL (ref 0.7–4.0)
MCH: 30.8 pg (ref 26.0–34.0)
MCHC: 32.3 g/dL (ref 30.0–36.0)
MCV: 95.2 fL (ref 80.0–100.0)
Monocytes Absolute: 0.4 10*3/uL (ref 0.1–1.0)
Monocytes Relative: 10 %
Neutro Abs: 2.6 10*3/uL (ref 1.7–7.7)
Neutrophils Relative %: 64 %
Platelets: 181 10*3/uL (ref 150–400)
RBC: 4.61 MIL/uL (ref 3.87–5.11)
RDW: 12.8 % (ref 11.5–15.5)
WBC: 4 10*3/uL (ref 4.0–10.5)
nRBC: 0 % (ref 0.0–0.2)

## 2019-12-12 LAB — TROPONIN I (HIGH SENSITIVITY): Troponin I (High Sensitivity): 4 ng/L (ref <18)

## 2019-12-12 NOTE — Discharge Instructions (Addendum)
Your laboratory results were within normal limits. The xray of your chest showed no abnormality.   You may alternate ibuprofen or tylenol for your pain.   Follow up with your primary care physician as needed.

## 2019-12-12 NOTE — ED Triage Notes (Signed)
Pain under her left breast x 2 days. She is supposed to go to the beach tomorrow and wanted to have it checked out.

## 2019-12-12 NOTE — ED Provider Notes (Signed)
[facility] Provider Note   CSN: [id number] Arrival date & time: 12/12/19  1148     History Chief Complaint  Patient presents with  . Breast Pain    Cheryl Lawson is a 68 y.o. female.  69 y.o female with a PMH of Anxiety, Depression, Breast CA presents to the ED with a chief complaint of left breast pain x 2 days ago. Patient describes a constant pulsating pain along her left breast which gets sharper and then may radiate to her back.  He does report being fairly active, does upper body exercises but has been doing this for a while.  No alleviating or exacerbating factors.  Does not endorse any shortness of breath but does report "having a hard time taking a deep breath, unsure whether was my anxiety ", ports taking a Xanax earlier this morning to help with symptoms.Reports pain is more so "internal" not reproducible with palpation. She does report taking advil a couple of days ago, husband "told me it could be upper GI issues". No fever, no shortness of breath, no n/v/d but feels somewhat constipated. No urinary symptoms.     The history is provided by the patient.       Past Medical History:  Diagnosis Date  . Anxiety   . Breast cancer ([place])   . Depression     Patient Active Problem List   Diagnosis Date Noted  . Anal irritation 11/09/2018  . Rash 11/03/2018  . Routine general medical examination at a health care facility 03/19/2018  . AC (acromioclavicular) arthritis 09/30/2017  . Pre-operative cardiovascular examination 07/11/2015  . Nodule of left external ear 05/31/2014  . Hashimoto's disease 11/08/2013  . Depression with anxiety 07/07/2013  . Right thyroid nodule 12/27/2012  . OSTEOPOROSIS 10/09/2009  . ADENOCARCINOMA, BREAST, HX OF 10/06/2007    Past Surgical History:  Procedure Laterality Date  . BREAST LUMPECTOMY     right  . FACIAL COSMETIC SURGERY    . infertility surgery    . NASAL SEPTUM SURGERY    . NECK SURGERY      lift   . TONSILLECTOMY       OB History   No obstetric history on file.     Family History  Problem Relation Age of Onset  . Ovarian cancer Mother   . Colon cancer Mother     Social History   Tobacco Use  . Smoking status: Never Smoker  . Smokeless tobacco: Never Used  Vaping Use  . Vaping Use: Never used  Substance Use Topics  . Alcohol use: Yes    Comment: rare  . Drug use: No    Home Medications Prior to Admission medications   Medication Sig Start Date End Date Taking? Authorizing Provider  ALPRAZolam Duanne Moron) 0.25 MG tablet TAKE 1 TABLET BY MOUTH AT BEDTIME AS NEEDED FOR SLEEP OR ANXIETY 10/28/19   Marrian Salvage, FNP  Calcium Carbonate-Vit D-Min (CALCIUM 1200 PO) Take 2 tablets by mouth daily.     [provider]  clobetasol (TEMOVATE) 0.05 % external solution Apply 1 application topically 2 (two) times daily. 10/25/19   Hoyt Koch, MD  diphenhydrAMINE HCl, Sleep, (ZZZQUIL PO) Take 1 tablet by mouth at bedtime. With melatonin    [provider]  DULoxetine (CYMBALTA) 30 MG capsule Take 1 capsule (30 mg total) by mouth daily. 10/25/19   Hoyt Koch, MD  minoxidil (ROGAINE) 2 % external solution Apply 1 application topically at bedtime. Put  in 3 places    [provider]  Multiple Vitamin (MULTIVITAMIN WITH MINERALS) TABS tablet Take 1 tablet by mouth daily. Patient taking differently: Take 1 tablet by mouth daily. Celebrate 02/24/14   Patrecia Pour, NP  Multiple Vitamins-Minerals (HAIR VITAMINS PO) Take 2 tablets by mouth daily.    [provider]    Allergies    Patient has no known allergies.  Review of Systems   Review of Systems  Constitutional: Negative for chills and fever.  HENT: Negative for sore throat.   Respiratory: Negative for shortness of breath.   Cardiovascular: Positive for chest pain. Negative for leg swelling.  Gastrointestinal: Negative for abdominal pain, diarrhea, nausea and  vomiting.  Genitourinary: Negative for flank pain.  Neurological: Negative for light-headedness and headaches.    Physical Exam Updated Vital Signs BP 116/75   Pulse 77   Temp 98.2 F (36.8 C) (Oral)   Resp 15   Ht 5\' 5"  (1.651 m)   Wt 43.1 kg   SpO2 100%   BMI 15.81 kg/m   Physical Exam Vitals and nursing note reviewed.  Constitutional:      Appearance: Normal appearance. She is not ill-appearing or toxic-appearing.  HENT:     Head: Normocephalic and atraumatic.     Mouth/Throat:     Mouth: Mucous membranes are moist.  Cardiovascular:     Rate and Rhythm: Normal rate.  Pulmonary:     Effort: Pulmonary effort is normal.     Breath sounds: No wheezing or rales.     Comments: Lungs are clear to auscultation, no wheezing, rhonchi or rales.  Abdominal:     General: Abdomen is flat.     Palpations: Abdomen is soft.     Tenderness: There is no abdominal tenderness. There is no right CVA tenderness or left CVA tenderness.     Comments: Abdomen is soft, non tender to palpation, no erythema or rashes, no CVA. Normal bowel sounds.   Musculoskeletal:     Cervical back: Normal range of motion and neck supple.  Skin:    General: Skin is warm and dry.  Neurological:     Mental Status: She is alert and oriented to person, place, and time.     ED Results / Procedures / Treatments   Labs (all labs ordered are listed, but only abnormal results are displayed) Labs Reviewed  BASIC METABOLIC PANEL  CBC WITH DIFFERENTIAL/PLATELET  TROPONIN I (HIGH SENSITIVITY)  TROPONIN I (HIGH SENSITIVITY)    EKG EKG Interpretation  Date/Time:  Monday December 12 2019 13:36:38 EDT Ventricular Rate:  76 PR Interval:    QRS Duration: 88 QT Interval:  378 QTC Calculation: 425 R Axis:   88 Text Interpretation: Sinus rhythm Borderline short PR interval Biatrial enlargement Borderline right axis deviation No significant change since 05/10/2008 Confirmed by Veryl Speak [phone]) on 12/12/2019 1:41:38  PM   Radiology DG Chest 2 View  Result Date: 12/12/2019 CLINICAL DATA:  Left breast pain for 2 days EXAM: CHEST - 2 VIEW COMPARISON:  07/02/2016 FINDINGS: The lungs are hyperinflated likely secondary to COPD. There is no focal consolidation. There is no pleural effusion or pneumothorax. The heart and mediastinal contours are unremarkable. 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 There is no acute osseous abnormality. IMPRESSION: No active cardiopulmonary disease. Electronically Signed   By: Kathreen Devoid   On: 12/12/2019 14:22    Procedures Procedures (including critical care time)  Medications Ordered in ED Medications - No data  to display  ED Course  I have reviewed the triage vital signs and the nursing notes.  Pertinent labs & imaging results that were available during my care of the patient were reviewed by me and considered in my medical decision making (see chart for details).    MDM Rules/Calculators/A&P   Patient with a past medical history of anxiety presents to the ED with complaints of left under breast constant pain for the past 2 days.  No alleviating or exacerbating factors.  Endorses a fairly active lifestyle, reports she does upper body exercises and has not had any new routines.  Differential diagnoses included but not limited to shingles, muscle strain, ACS, pneumonia versus constipation.   Primary evaluation patient is well-appearing, nontoxic, vitals are within normal limits, no signs of tachycardia, no signs of hypoxia.  Currently not on any OCPs, no prior history of blood clots.  Does have a history of breast CA to the right breast with a lumpectomy.  Does take medication to help with anxiety, states she took Xanax prior to coming into the ED today.  Vitals are fairly stable.  No visible rash, erythema, signs of shingles at this time.  EKG remains unchanged from her previous without any signs of ST elevations or changes consistent with infarct.   Chest  x-ray without any pneumonia, pneumothorax, pleural effusion.  Potation of her labs by me, revealed a CBC without any leukocytosis, hemoglobin is stable with no signs of anemia.  BMP is within normal limits.  Creatinine level is unremarkable.  First troponin is 4, does not have any prior history of CAD.  HEART SCORE 2  I have a lower suspicion for ACS.  There is no tachycardia, no hypoxia, no risks factors lower suspicion for Pulmonary embolism. We discussed treatment with ibuprofen or tylenol along with follow up with PCP. Return precautions discussed at length.    Portions of this note were generated with [employer]. Dictation errors may occur despite best attempts at proofreading.  Final Clinical Impression(s) / ED Diagnoses Final diagnoses:  Breast pain, left    Rx / DC Orders ED Discharge Orders    None       Janeece Fitting, PA-C 12/12/19 1617    Veryl Speak, MD 12/13/19 [phone]

## 2019-12-13 ENCOUNTER — Telehealth: Payer: Self-pay | Admitting: Internal Medicine

## 2019-12-13 MED ORDER — VALACYCLOVIR HCL 1 G PO TABS
1000.0000 mg | ORAL_TABLET | Freq: Three times a day (TID) | ORAL | 0 refills | Status: AC
Start: 2019-12-13 — End: 2019-12-20

## 2019-12-13 NOTE — Addendum Note (Signed)
Addended by: Hoyt Koch on: 12/13/2019 08:44 AM   Modules accepted: Orders

## 2019-12-13 NOTE — Telephone Encounter (Signed)
Per [employer], patient is leaving to go out of town on vacation today at 1:00pm.

## 2019-12-13 NOTE — Telephone Encounter (Signed)
Sent in valtrex, take 1 pill 3 times a day for 1 week to treat possible shingles. If no improvement needs visit.

## 2019-12-13 NOTE — Telephone Encounter (Signed)
New message:    Pt is calling and states that she went to the ER last night and they have diagnosed her with Shingles. She states she would like for Dr. Sharlet Salina to prescribe her and anti-viral for the shingles. Please advise.    [id number] - [place], [place] [address]

## 2019-12-13 NOTE — Telephone Encounter (Signed)
Pt has been informed.

## 2019-12-21 ENCOUNTER — Telehealth: Payer: Self-pay | Admitting: Internal Medicine

## 2019-12-21 NOTE — Telephone Encounter (Signed)
    Patient requesting pain medication for shingles, under breast and back.

## 2019-12-22 NOTE — Telephone Encounter (Signed)
Was advised at last phone note if no improvement needs visit.

## 2019-12-23 ENCOUNTER — Ambulatory Visit: Payer: Medicare Other | Admitting: Internal Medicine

## 2020-01-02 ENCOUNTER — Other Ambulatory Visit: Payer: Self-pay | Admitting: Family

## 2020-02-10 ENCOUNTER — Other Ambulatory Visit: Payer: Self-pay | Admitting: Family

## 2020-02-28 NOTE — Progress Notes (Signed)
Subjective:    Patient ID: Cheryl Lawson, female    DOB: 08-19-1950, 69 y.o.   MRN: [id number]  HPI  She is here for pre-operative clearance at the request of Dr Maris Berger for cosmetic surgery scheduled for 04/02/2020.  Per patient she will have twilight anesthesia.  She has had surgeries in the past without difficulty.   She denies any personal or family history of problems with anesthesia or bleeding/blood clot problems.    She has no concerns.  She is taking all her medication as prescribed.   She is exercising regularly.  She walks/runs 50 min 4 times a week.  Also does yoga and weights. .  With her daily activities she denies chest pain, palpitations, SOB and lightheadedness.       Medications and allergies reviewed with patient and updated if appropriate.  Patient Active Problem List   Diagnosis Date Noted  . Anal irritation 11/09/2018  . Rash 11/03/2018  . Routine general medical examination at a health care facility 03/19/2018  . AC (acromioclavicular) arthritis 09/30/2017  . Pre-operative cardiovascular examination 07/11/2015  . Nodule of left external ear 05/31/2014  . Hashimoto's disease 11/08/2013  . Depression with anxiety 07/07/2013  . Right thyroid nodule 12/27/2012  . OSTEOPOROSIS 10/09/2009  . ADENOCARCINOMA, BREAST, HX OF 10/06/2007    Current Outpatient Medications on File Prior to Visit  Medication Sig Dispense Refill  . ALPRAZolam (XANAX) 0.25 MG tablet TAKE 1 TABLET BY MOUTH AT BEDTIME AS NEEDED FOR SLEEP OR ANXIETY 30 tablet 0  . Calcium Carbonate-Vit D-Min (CALCIUM 1200 PO) Take 2 tablets by mouth daily.     . clobetasol (TEMOVATE) 0.05 % external solution Apply 1 application topically 2 (two) times daily. (Patient taking differently: Apply 1 application topically. Use twice a week as needed) 50 mL 0  . diphenhydrAMINE HCl, Sleep, (ZZZQUIL PO) Take 1 tablet by mouth at bedtime. With melatonin    . minoxidil (ROGAINE) 2 % external solution  Apply 1 application topically at bedtime. Put in 3 places    . Multiple Vitamin (MULTIVITAMIN WITH MINERALS) TABS tablet Take 1 tablet by mouth daily. (Patient taking differently: Take 1 tablet by mouth daily. Celebrate)    . Multiple Vitamins-Minerals (HAIR VITAMINS PO) Take 2 tablets by mouth daily.     No current facility-administered medications on file prior to visit.    Past Medical History:  Diagnosis Date  . Anxiety   . Breast cancer ([place])   . Depression     Past Surgical History:  Procedure Laterality Date  . BREAST LUMPECTOMY     right  . FACIAL COSMETIC SURGERY    . infertility surgery    . NASAL SEPTUM SURGERY    . NECK SURGERY     lift   . TONSILLECTOMY      Social History   Socioeconomic History  . Marital status: Married    Spouse name: Not on file  . Number of children: 1  . Years of education: 38  . Highest education level: Not on file  Occupational History    Employer: [employer]  Tobacco Use  . Smoking status: Never Smoker  . Smokeless tobacco: Never Used  Vaping Use  . Vaping Use: Never used  Substance and Sexual Activity  . Alcohol use: Yes    Comment: rare  . Drug use: No  . Sexual activity: Not on file  Other Topics Concern  . Not on file  Social History Narrative  HSG. Married - '77. great niece lives with her. 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 no history of physical or sexual abuse.    Social Determinants of Health   Financial Resource Strain:   . Difficulty of Paying Living Expenses: Not on file  Food Insecurity:   . Worried About Charity fundraiser in the Last Year: Not on file  . Ran Out of Food in the Last Year: Not on file  Transportation Needs:   . Lack of Transportation (Medical): Not on file  . Lack of Transportation (Non-Medical): Not on file  Physical Activity:   . Days of Exercise per Week: Not on file  . Minutes of Exercise per Session: Not on file  Stress:   . Feeling  of Stress : Not on file  Social Connections:   . Frequency of Communication with Friends and Family: Not on file  . Frequency of Social Gatherings with Friends and Family: Not on file  . Attends Religious Services: Not on file  . Active Member of Clubs or Organizations: Not on file  . Attends Archivist Meetings: Not on file  . Marital Status: Not on file    Family History  Problem Relation Age of Onset  . Ovarian cancer Mother   . Colon cancer Mother     Review of Systems  Constitutional: Negative for chills and fever.  Eyes: Negative for visual disturbance.  Respiratory: Negative for cough, shortness of breath and wheezing.   Cardiovascular: Negative for chest pain, palpitations and leg swelling.  Gastrointestinal: Negative for abdominal pain, blood in stool, constipation, diarrhea and nausea.  Genitourinary: Negative for dysuria and hematuria.  Musculoskeletal: Negative for arthralgias and back pain.  Skin: Negative for rash.  Neurological: Negative for dizziness, light-headedness and headaches.       Objective:   Vitals:   02/29/20 1050  BP: 100/72  Pulse: 80  Temp: 98.1 F (36.7 C)  SpO2: 95%   Filed Weights   02/29/20 1050  Weight: 96 lb 12.8 oz (43.9 kg)   Body mass index is 16.11 kg/m.  BP Readings from Last 3 Encounters:  02/29/20 100/72  12/12/19 121/83  10/25/19 110/76    Wt Readings from Last 3 Encounters:  02/29/20 96 lb 12.8 oz (43.9 kg)  12/12/19 95 lb (43.1 kg)  10/25/19 97 lb (44 kg)     Physical Exam Constitutional: She appears well-developed and well-nourished. No distress.  HENT:  Head: Normocephalic and atraumatic.  Right Ear: External ear normal. Normal ear canal and TM Left Ear: External ear normal.  Normal ear canal and TM Mouth/Throat: Oropharynx is clear and moist.  Eyes: Conjunctivae and EOM are normal.  Neck: Neck supple. No tracheal deviation present. No thyromegaly present.  No carotid bruit  Cardiovascular:  Normal rate, regular rhythm and normal heart sounds.   No murmur heard.  No edema. Pulmonary/Chest: Effort normal and breath sounds normal. No respiratory distress. She has no wheezes. She has no rales.  Abdominal: Soft. She exhibits no distension. There is no tenderness.  Lymphadenopathy: She has no cervical adenopathy.  Skin: Skin is warm and dry. She is not diaphoretic.  Psychiatric: She has a normal mood and affect. Her behavior is normal.        Assessment & Plan:   Preoperative evaluation: Here for preoperative evaluation for surgery planned on 04/02/2020 for cosmetic surgery EKG, CBC, CMP requested by surgery EKG last done within 6 months of surgery-June and we  will send that EKG.  No changes in health or cardiac status so no need to repeat CBC and CMP today Overall low risk for low risk procedure She is healthy and has no concerning symptoms Will clear for surgery  See Problem List for Assessment and Plan of chronic medical problems.   This visit occurred during the SARS-CoV-2 public health emergency.  Safety protocols were in place, including screening questions prior to the visit, additional usage of staff PPE, and extensive cleaning of exam room while observing appropriate contact time as indicated for disinfecting solutions.

## 2020-02-29 ENCOUNTER — Other Ambulatory Visit: Payer: Self-pay

## 2020-02-29 ENCOUNTER — Ambulatory Visit (INDEPENDENT_AMBULATORY_CARE_PROVIDER_SITE_OTHER): Payer: Medicare Other | Admitting: Internal Medicine

## 2020-02-29 ENCOUNTER — Encounter: Payer: Self-pay | Admitting: Internal Medicine

## 2020-02-29 VITALS — BP 100/72 | HR 80 | Temp 98.1°F | Wt 96.8 lb

## 2020-02-29 DIAGNOSIS — E875 Hyperkalemia: Secondary | ICD-10-CM | POA: Diagnosis not present

## 2020-02-29 DIAGNOSIS — Z01818 Encounter for other preprocedural examination: Secondary | ICD-10-CM | POA: Diagnosis not present

## 2020-02-29 NOTE — Patient Instructions (Addendum)
  Blood work was ordered.     Medications reviewed and updated.  Changes include :   none   We will send a clearance form to your surgeon.

## 2020-03-01 LAB — COMPLETE METABOLIC PANEL WITH GFR
AG Ratio: 2.1 (calc) (ref 1.0–2.5)
ALT: 18 U/L (ref 6–29)
AST: 38 U/L — ABNORMAL HIGH (ref 10–35)
Albumin: 4.7 g/dL (ref 3.6–5.1)
Alkaline phosphatase (APISO): 60 U/L (ref 37–153)
BUN/Creatinine Ratio: 15 (calc) (ref 6–22)
BUN: 16 mg/dL (ref 7–25)
CO2: 27 mmol/L (ref 20–32)
Calcium: 10.5 mg/dL — ABNORMAL HIGH (ref 8.6–10.4)
Chloride: 102 mmol/L (ref 98–110)
Creat: 1.05 mg/dL — ABNORMAL HIGH (ref 0.50–0.99)
GFR, Est African American: 64 mL/min/{1.73_m2} (ref >=60)
GFR, Est Non African American: 55 mL/min/{1.73_m2} — ABNORMAL LOW (ref >=60)
Globulin: 2.2 g/dL (calc) (ref 1.9–3.7)
Glucose, Bld: 91 mg/dL (ref 65–99)
Potassium: 5.7 mmol/L — ABNORMAL HIGH (ref 3.5–5.3)
Sodium: 139 mmol/L (ref 135–146)
Total Bilirubin: 0.6 mg/dL (ref 0.2–1.2)
Total Protein: 6.9 g/dL (ref 6.1–8.1)

## 2020-03-01 LAB — CBC WITH DIFFERENTIAL/PLATELET
Absolute Monocytes: 391 cells/uL (ref 200–950)
Basophils Absolute: 59 cells/uL (ref 0–200)
Basophils Relative: 1.4 %
Eosinophils Absolute: 151 cells/uL (ref 15–500)
Eosinophils Relative: 3.6 %
HCT: 45.8 % — ABNORMAL HIGH (ref 35.0–45.0)
Hemoglobin: 15.4 g/dL (ref 11.7–15.5)
Lymphs Abs: 890 cells/uL (ref 850–3900)
MCH: 31.5 pg (ref 27.0–33.0)
MCHC: 33.6 g/dL (ref 32.0–36.0)
MCV: 93.7 fL (ref 80.0–100.0)
MPV: 10.3 fL (ref 7.5–12.5)
Monocytes Relative: 9.3 %
Neutro Abs: 2709 cells/uL (ref 1500–7800)
Neutrophils Relative %: 64.5 %
Platelets: 183 10*3/uL (ref 140–400)
RBC: 4.89 10*6/uL (ref 3.80–5.10)
RDW: 12.3 % (ref 11.0–15.0)
Total Lymphocyte: 21.2 %
WBC: 4.2 10*3/uL (ref 3.8–10.8)

## 2020-03-01 NOTE — Addendum Note (Signed)
Addended by: Binnie Rail on: 03/01/2020 07:13 AM   Modules accepted: Orders

## 2020-03-02 ENCOUNTER — Other Ambulatory Visit: Payer: Medicare Other

## 2020-03-02 ENCOUNTER — Other Ambulatory Visit: Payer: Self-pay

## 2020-03-02 DIAGNOSIS — E875 Hyperkalemia: Secondary | ICD-10-CM | POA: Diagnosis not present

## 2020-03-03 LAB — COMPLETE METABOLIC PANEL WITH GFR
AG Ratio: 2.3 (calc) (ref 1.0–2.5)
ALT: 17 U/L (ref 6–29)
AST: 29 U/L (ref 10–35)
Albumin: 4.4 g/dL (ref 3.6–5.1)
Alkaline phosphatase (APISO): 54 U/L (ref 37–153)
BUN: 13 mg/dL (ref 7–25)
CO2: 28 mmol/L (ref 20–32)
Calcium: 9.7 mg/dL (ref 8.6–10.4)
Chloride: 103 mmol/L (ref 98–110)
Creat: 0.93 mg/dL (ref 0.50–0.99)
GFR, Est African American: 74 mL/min/{1.73_m2} (ref >=60)
GFR, Est Non African American: 64 mL/min/{1.73_m2} (ref >=60)
Globulin: 1.9 g/dL (calc) (ref 1.9–3.7)
Glucose, Bld: 94 mg/dL (ref 65–99)
Potassium: 4.6 mmol/L (ref 3.5–5.3)
Sodium: 139 mmol/L (ref 135–146)
Total Bilirubin: 0.6 mg/dL (ref 0.2–1.2)
Total Protein: 6.3 g/dL (ref 6.1–8.1)

## 2020-03-13 ENCOUNTER — Other Ambulatory Visit: Payer: Self-pay | Admitting: Family

## 2020-03-13 NOTE — Telephone Encounter (Signed)
Please go ahead and get her to schedule a follow-up with Dr. Sharlet Salina; has not seen her since May- would recommend OV in December.

## 2020-03-13 NOTE — Telephone Encounter (Signed)
Spoke with patient today and info given. She will call back and make appointment for December to follow up with Dr. Sharlet Salina.

## 2020-04-11 DIAGNOSIS — J069 Acute upper respiratory infection, unspecified: Secondary | ICD-10-CM | POA: Diagnosis not present

## 2020-04-11 DIAGNOSIS — Z20822 Contact with and (suspected) exposure to covid-19: Secondary | ICD-10-CM | POA: Diagnosis not present

## 2020-05-07 ENCOUNTER — Other Ambulatory Visit: Payer: Self-pay | Admitting: Family

## 2020-05-16 ENCOUNTER — Other Ambulatory Visit: Payer: Self-pay

## 2020-05-16 ENCOUNTER — Ambulatory Visit (INDEPENDENT_AMBULATORY_CARE_PROVIDER_SITE_OTHER): Payer: Medicare Other | Admitting: Internal Medicine

## 2020-05-16 ENCOUNTER — Encounter: Payer: Self-pay | Admitting: Internal Medicine

## 2020-05-16 DIAGNOSIS — F418 Other specified anxiety disorders: Secondary | ICD-10-CM | POA: Diagnosis not present

## 2020-05-16 NOTE — Patient Instructions (Signed)
I am so sorry you are still dealing with this.

## 2020-05-16 NOTE — Progress Notes (Signed)
   Subjective:   Patient ID: Cheryl Lawson, female    DOB: 07-21-1950, 69 y.o.   MRN: [id number]  HPI The patient is a 69 YO female coming in for concerns about recent plastic surgeries. She has had 2 reparative surgeries on left cheek implant which were not successful. She is still dealing with chronic pain and movement in that area which is emotionally difficult for her. She generally copes okay but this has been going on for about 3 years now. She uses alprazolam rarely for sleep or anxiety. She has been more isolated due to being self conscious of the area. Covid-19 masking has helped slightly. Overall struggling some.   Review of Systems  Constitutional: Negative.   HENT: Negative.   Eyes: Negative.   Respiratory: Negative for cough, chest tightness and shortness of breath.   Cardiovascular: Negative for chest pain, palpitations and leg swelling.  Gastrointestinal: Negative for abdominal distention, abdominal pain, constipation, diarrhea, nausea and vomiting.  Musculoskeletal: Negative.   Skin: Negative.   Neurological: Negative.   Psychiatric/Behavioral: Positive for dysphoric mood. The patient is nervous/anxious.     Objective:  Physical Exam Constitutional:      Appearance: She is well-developed.  HENT:     Head: Normocephalic and atraumatic.  Cardiovascular:     Rate and Rhythm: Normal rate and regular rhythm.  Pulmonary:     Effort: Pulmonary effort is normal. No respiratory distress.     Breath sounds: Normal breath sounds. No wheezing or rales.  Abdominal:     General: Bowel sounds are normal. There is no distension.     Palpations: Abdomen is soft.     Tenderness: There is no abdominal tenderness. There is no rebound.  Musculoskeletal:     Cervical back: Normal range of motion.  Skin:    General: Skin is warm and dry.  Neurological:     Mental Status: She is alert and oriented to person, place, and time.     Coordination: Coordination normal.     Vitals:    05/16/20 1053  BP: 110/70  Pulse: 73  Temp: 98.8 F (37.1 C)  TempSrc: Oral  SpO2: 98%  Weight: 97 lb 12.8 oz (44.4 kg)  Height: 5\' 5"  (1.651 m)    This visit occurred during the SARS-CoV-2 public health emergency.  Safety protocols were in place, including screening questions prior to the visit, additional usage of staff PPE, and extensive cleaning of exam room while observing appropriate contact time as indicated for disinfecting solutions.   Assessment & Plan:

## 2020-05-17 ENCOUNTER — Encounter: Payer: Self-pay | Admitting: Internal Medicine

## 2020-05-17 NOTE — Assessment & Plan Note (Signed)
Uses alprazolam judiciously and will refill as needed. She is coping with some problems related to a surgery and this has been ongoing without clear resolution for several years now.

## 2020-06-04 DIAGNOSIS — Z23 Encounter for immunization: Secondary | ICD-10-CM | POA: Diagnosis not present

## 2020-06-13 ENCOUNTER — Other Ambulatory Visit: Payer: Self-pay | Admitting: Internal Medicine

## 2020-06-13 NOTE — Telephone Encounter (Signed)
Last refill per controlled substance database: 05/08/20 Last OV: 05/16/20 Next OV: none scheduled

## 2020-07-31 DIAGNOSIS — Z01419 Encounter for gynecological examination (general) (routine) without abnormal findings: Secondary | ICD-10-CM | POA: Diagnosis not present

## 2020-08-10 DIAGNOSIS — Z9229 Personal history of other drug therapy: Secondary | ICD-10-CM | POA: Diagnosis not present

## 2020-08-10 DIAGNOSIS — C50911 Malignant neoplasm of unspecified site of right female breast: Secondary | ICD-10-CM | POA: Diagnosis not present

## 2020-08-10 DIAGNOSIS — Z170 Estrogen receptor positive status [ER+]: Secondary | ICD-10-CM | POA: Diagnosis not present

## 2020-08-10 DIAGNOSIS — Z1231 Encounter for screening mammogram for malignant neoplasm of breast: Secondary | ICD-10-CM | POA: Diagnosis not present

## 2020-08-10 DIAGNOSIS — Z923 Personal history of irradiation: Secondary | ICD-10-CM | POA: Diagnosis not present

## 2020-08-20 ENCOUNTER — Encounter: Payer: Self-pay | Admitting: Internal Medicine

## 2020-08-20 DIAGNOSIS — F418 Other specified anxiety disorders: Secondary | ICD-10-CM

## 2020-08-22 ENCOUNTER — Encounter: Payer: Self-pay | Admitting: Internal Medicine

## 2020-08-23 NOTE — Telephone Encounter (Signed)
See below

## 2020-09-19 ENCOUNTER — Other Ambulatory Visit: Payer: Self-pay | Admitting: Internal Medicine

## 2020-09-19 ENCOUNTER — Encounter: Payer: Self-pay | Admitting: Internal Medicine

## 2020-09-20 ENCOUNTER — Encounter: Payer: Self-pay | Admitting: Internal Medicine

## 2020-09-20 ENCOUNTER — Other Ambulatory Visit: Payer: Self-pay

## 2020-09-20 ENCOUNTER — Ambulatory Visit (INDEPENDENT_AMBULATORY_CARE_PROVIDER_SITE_OTHER): Payer: Medicare Other | Admitting: Internal Medicine

## 2020-09-20 VITALS — BP 108/84 | HR 84 | Temp 98.2°F | Resp 18 | Ht 65.0 in | Wt 99.4 lb

## 2020-09-20 DIAGNOSIS — F418 Other specified anxiety disorders: Secondary | ICD-10-CM

## 2020-09-20 DIAGNOSIS — R5383 Other fatigue: Secondary | ICD-10-CM | POA: Diagnosis not present

## 2020-09-20 DIAGNOSIS — E559 Vitamin D deficiency, unspecified: Secondary | ICD-10-CM

## 2020-09-20 DIAGNOSIS — E538 Deficiency of other specified B group vitamins: Secondary | ICD-10-CM | POA: Diagnosis not present

## 2020-09-20 LAB — COMPREHENSIVE METABOLIC PANEL
ALT: 28 U/L (ref 0–35)
AST: 36 U/L (ref 0–37)
Albumin: 4.8 g/dL (ref 3.5–5.2)
Alkaline Phosphatase: 62 U/L (ref 39–117)
BUN: 14 mg/dL (ref 6–23)
CO2: 29 mEq/L (ref 19–32)
Calcium: 10.3 mg/dL (ref 8.4–10.5)
Chloride: 102 mEq/L (ref 96–112)
Creatinine, Ser: 1.04 mg/dL (ref 0.40–1.20)
GFR: 55.65 mL/min — ABNORMAL LOW (ref >60.00)
Glucose, Bld: 82 mg/dL (ref 70–99)
Potassium: 5.4 mEq/L — ABNORMAL HIGH (ref 3.5–5.1)
Sodium: 139 mEq/L (ref 135–145)
Total Bilirubin: 0.5 mg/dL (ref 0.2–1.2)
Total Protein: 7.1 g/dL (ref 6.0–8.3)

## 2020-09-20 LAB — CBC
HCT: 46.7 % — ABNORMAL HIGH (ref 36.0–46.0)
Hemoglobin: 15.8 g/dL — ABNORMAL HIGH (ref 12.0–15.0)
MCHC: 33.7 g/dL (ref 30.0–36.0)
MCV: 90.6 fl (ref 78.0–100.0)
Platelets: 185 10*3/uL (ref 150.0–400.0)
RBC: 5.16 Mil/uL — ABNORMAL HIGH (ref 3.87–5.11)
RDW: 13.2 % (ref 11.5–15.5)
WBC: 5.5 10*3/uL (ref 4.0–10.5)

## 2020-09-20 LAB — VITAMIN D 25 HYDROXY (VIT D DEFICIENCY, FRACTURES): VITD: 79.38 ng/mL (ref 30.00–100.00)

## 2020-09-20 LAB — TSH: TSH: 3.18 u[IU]/mL (ref 0.35–4.50)

## 2020-09-20 LAB — VITAMIN B12: Vitamin B-12: >1506 pg/mL — ABNORMAL HIGH (ref 211–911)

## 2020-09-20 LAB — T4, FREE: Free T4: 0.89 ng/dL (ref 0.60–1.60)

## 2020-09-20 MED ORDER — CLOBETASOL PROPIONATE 0.05 % EX SOLN: 1.0000 "application " | Freq: Two times a day (BID) | CUTANEOUS | 3 refills | Status: DC

## 2020-09-20 NOTE — Progress Notes (Signed)
   Subjective:   Patient ID: Cheryl Lawson, female    DOB: 1950-07-12, 70 y.o.   MRN: [id number]  HPI The patient is a 70 YO female coming in for concerns about her anxiety and struggles with her cheek implant. She is going back to her plastic surgeon soon and is not sure she has confidence in them to help her. She is thinking about seeking another opinion. She does have chronic pain from this and is somewhat regretful about her seeking the initial surgery for the cheek implants. She has had to have several surgeries since that time to fix complications of this which have not been successful.   Review of Systems  Constitutional: Negative.   HENT: Negative.   Eyes: Negative.   Respiratory: Negative for cough, chest tightness and shortness of breath.   Cardiovascular: Negative for chest pain, palpitations and leg swelling.  Gastrointestinal: Negative for abdominal distention, abdominal pain, constipation, diarrhea, nausea and vomiting.  Musculoskeletal: Positive for myalgias.  Skin: Negative.   Neurological: Negative.   Psychiatric/Behavioral: Positive for dysphoric mood.    Objective:  Physical Exam Constitutional:      Appearance: She is well-developed.  HENT:     Head: Normocephalic and atraumatic.  Cardiovascular:     Rate and Rhythm: Normal rate and regular rhythm.  Pulmonary:     Effort: Pulmonary effort is normal. No respiratory distress.     Breath sounds: Normal breath sounds. No wheezing or rales.  Abdominal:     General: Bowel sounds are normal. There is no distension.     Palpations: Abdomen is soft.     Tenderness: There is no abdominal tenderness. There is no rebound.  Musculoskeletal:     Cervical back: Normal range of motion.  Skin:    General: Skin is warm and dry.  Neurological:     Mental Status: She is alert and oriented to person, place, and time.     Coordination: Coordination normal.     Vitals:   09/20/20 1401  BP: 108/84  Pulse: 84  Resp: 18   Temp: 98.2 F (36.8 C)  TempSrc: Oral  SpO2: 94%  Weight: 99 lb 6.4 oz (45.1 kg)  Height: 5\' 5"  (1.651 m)    This visit occurred during the SARS-CoV-2 public health emergency.  Safety protocols were in place, including screening questions prior to the visit, additional usage of staff PPE, and extensive cleaning of exam room while observing appropriate contact time as indicated for disinfecting solutions.   Assessment & Plan:  Visit time 25 minutes in face to face communication with patient and coordination of care, additional 5 minutes spent in record review, coordination or care, ordering tests, communicating/referring to other healthcare professionals, documenting in medical records all on the same day of the visit for total time 30 minutes spent on the visit.

## 2020-09-21 ENCOUNTER — Other Ambulatory Visit: Payer: Self-pay | Admitting: Internal Medicine

## 2020-09-21 ENCOUNTER — Encounter: Payer: Self-pay | Admitting: Internal Medicine

## 2020-09-21 DIAGNOSIS — E875 Hyperkalemia: Secondary | ICD-10-CM

## 2020-09-21 NOTE — Assessment & Plan Note (Signed)
She is struggling with grief about her surgical failure and feeling a little despair about the situation. Working with Social worker. Offered cymbalta which has helped in the past and she wants to hold off for now. Checking labs for metabolic causes.

## 2020-09-24 MED ORDER — TRAZODONE HCL 50 MG PO TABS: 25.0000 mg | ORAL_TABLET | Freq: Every evening | ORAL | 3 refills | Status: DC | PRN

## 2020-09-24 NOTE — Addendum Note (Signed)
Addended by: Pricilla Holm A on: 09/24/2020 03:12 PM   Modules accepted: Orders

## 2020-09-25 ENCOUNTER — Other Ambulatory Visit (INDEPENDENT_AMBULATORY_CARE_PROVIDER_SITE_OTHER): Payer: Medicare Other

## 2020-09-25 DIAGNOSIS — E875 Hyperkalemia: Secondary | ICD-10-CM | POA: Diagnosis not present

## 2020-09-25 LAB — BASIC METABOLIC PANEL
BUN: 18 mg/dL (ref 6–23)
CO2: 29 mEq/L (ref 19–32)
Calcium: 9.5 mg/dL (ref 8.4–10.5)
Chloride: 101 mEq/L (ref 96–112)
Creatinine, Ser: 0.95 mg/dL (ref 0.40–1.20)
GFR: 62.02 mL/min (ref >60.00)
Glucose, Bld: 76 mg/dL (ref 70–99)
Potassium: 4.3 mEq/L (ref 3.5–5.1)
Sodium: 137 mEq/L (ref 135–145)

## 2020-10-11 ENCOUNTER — Other Ambulatory Visit: Payer: Self-pay | Admitting: Internal Medicine

## 2020-10-12 NOTE — Telephone Encounter (Signed)
Patient is requesting a refill of the following medications: Requested Prescriptions   Pending Prescriptions Disp Refills  . ALPRAZolam (XANAX) 0.25 MG tablet [Pharmacy Med Name: ALPRAZOLAM 0.25 MG TABLET] 30 tablet 3    Sig: TAKE 1 TABLET BY MOUTH AT BEDTIME AS NEEDED FOR SLEEP OR ANXIETY    Date of patient request: 10/11/20  Last office visit: 09/20/20  Date of last refill: 06/14/20  Last refill amount: 30, 3 refills Follow up time period per chart: n/a

## 2020-10-16 ENCOUNTER — Other Ambulatory Visit: Payer: Self-pay | Admitting: Internal Medicine

## 2020-10-16 ENCOUNTER — Encounter: Payer: Self-pay | Admitting: Internal Medicine

## 2020-10-18 ENCOUNTER — Encounter: Payer: Self-pay | Admitting: Internal Medicine

## 2020-10-19 MED ORDER — DULOXETINE HCL 20 MG PO CPEP: 20.0000 mg | ORAL_CAPSULE | Freq: Every day | ORAL | 3 refills | Status: DC

## 2020-10-24 ENCOUNTER — Encounter: Payer: Self-pay | Admitting: Internal Medicine

## 2020-11-06 ENCOUNTER — Telehealth: Payer: Self-pay | Admitting: Internal Medicine

## 2020-11-06 NOTE — Progress Notes (Signed)
  Chronic Care Management   Note  11/06/2020 Name: DUSTIN BURRILL MRN: [id number] DOB: 1950-11-16  MONA AYARS is a 70 y.o. year old female who is a primary care patient of Hoyt Koch, MD. I reached out to Berenice Primas by phone today in response to a referral sent by Ms. Donnald Garre Ayre's PCP, Hoyt Koch, MD.   Ms. Cua was given information about Chronic Care Management services today including:  1. CCM service includes personalized support from designated clinical staff supervised by her physician, including individualized plan of care and coordination with other care providers 2. 24/7 contact phone numbers for assistance for urgent and routine care needs. 3. Service will only be billed when office clinical staff spend 20 minutes or more in a month to coordinate care. 4. Only one practitioner may furnish and bill the service in a calendar month. 5. The patient may stop CCM services at any time (effective at the end of the month) by phone call to the office staff.   Patient agreed to services and verbal consent obtained.   Follow up plan:   [place]

## 2020-11-11 ENCOUNTER — Other Ambulatory Visit: Payer: Self-pay | Admitting: Internal Medicine

## 2020-11-18 DIAGNOSIS — B349 Viral infection, unspecified: Secondary | ICD-10-CM | POA: Diagnosis not present

## 2020-11-18 DIAGNOSIS — Z20822 Contact with and (suspected) exposure to covid-19: Secondary | ICD-10-CM | POA: Diagnosis not present

## 2020-11-18 DIAGNOSIS — R067 Sneezing: Secondary | ICD-10-CM | POA: Diagnosis not present

## 2020-11-26 ENCOUNTER — Encounter: Payer: Self-pay | Admitting: Internal Medicine

## 2020-11-29 ENCOUNTER — Encounter: Payer: Self-pay | Admitting: Internal Medicine

## 2020-11-30 MED ORDER — SERTRALINE HCL 50 MG PO TABS: 50.0000 mg | ORAL_TABLET | Freq: Every day | ORAL | 3 refills | Status: DC

## 2020-12-10 NOTE — Progress Notes (Signed)
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 - took sertraline for 1 dose and caused diarrhea, HA, and felt as if she was hyper-aware- stopped taking - AE dissipated in about 2-3 days  -Recently patient has taken duloxetine which made her cheek pain worse, trazodone (for sleep) which caused rapid heart rate, and sertraline which was ineffective for her depression and caused intolerable side effects  - Reports that she has been walking daily which she does feel is beneficial in keeping depression under better control   Recommendations/Changes made from today's visit: - Recommended for patient to start paroxetine 32m daily (will plan to start at lower dose due to past intolerances of antidepressant medications)  Subjective: Cheryl BUCCELLATOis an 70y.o. year old female who is a primary patient of CHoyt Koch MD.  The CCM team was consulted for assistance with disease management and care coordination needs.    Engaged with patient by telephone for initial visit in response to provider referral for pharmacy case management and/or care coordination services.   Consent to Services:  The patient was given the following information about Chronic Care Management services today, agreed to services, and gave verbal consent: 1. CCM service includes personalized support from designated clinical staff supervised by the primary care provider, including individualized plan of care and coordination with other care providers 2. 24/7 contact phone numbers for assistance for urgent and routine care needs. 3. Service will only be billed when office clinical staff spend 20 minutes or more in a month to coordinate care. 4. Only one practitioner may furnish and bill the service in a calendar month. 5.The patient may stop CCM services at any time (effective at  the end of the month) by phone call to the office staff. 6. The patient will be responsible for cost sharing (co-pay) of up to 20% of the service fee (after annual deductible is met). Patient agreed to services and consent obtained.  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   Recent consult visits: 12/10/2020 - Urgent care - COVID positive / pharyngitis - no changes to medications   Hospital visits: None in previous 6 months  Objective:  Lab Results  Component Value Date   CREATININE 0.95 09/25/2020   BUN 18 09/25/2020   GFR 62.02 09/25/2020   GFRNONAA 64 03/02/2020   GFRAA 74 03/02/2020   NA 137 09/25/2020   K 4.3 09/25/2020   CALCIUM 9.5 09/25/2020   CO2 29 09/25/2020   GLUCOSE 76 09/25/2020    Lab Results  Component Value Date/Time   GFR 62.02 09/25/2020 02:55 PM   GFR 55.65 (L) 09/20/2020 02:47 PM    Last diabetic Eye exam:  No results found for: HMDIABEYEEXA  Last diabetic Foot exam:  No results found for: HMDIABFOOTEX   Lab Results  Component Value Date   CHOL 188 08/03/2019   HDL 67.40 08/03/2019   LDLCALC 101 (H) 08/03/2019   TRIG 102.0 08/03/2019  CHOLHDL 3 08/03/2019    Hepatic Function Latest Ref Rng & Units 09/20/2020 03/02/2020 02/29/2020  Total Protein 6.0 - 8.3 g/dL 7.1 6.3 6.9  Albumin 3.5 - 5.2 g/dL 4.8 - -  AST 0 - 37 U/L 36 29 38(H)  ALT 0 - 35 U/L _0 Alk Phosphatase 39 - 117 U/L 62 - -  Total Bilirubin 0.2 - 1.2 mg/dL 0.5 0.6 0.6  Bilirubin, Direct 0.0 - 0.3 mg/dL - - -    Lab Results  Component  Value Date/Time   TSH 3.18 09/20/2020 02:47 PM   TSH 1.91 03/19/2018 01:52 PM   FREET4 0.89 09/20/2020 02:47 PM   FREET4 0.91 03/19/2018 01:52 PM    CBC Latest Ref Rng & Units 09/20/2020 02/29/2020 12/12/2019  WBC 4.0 - 10.5 K/uL 5.5 4.2 4.0  Hemoglobin 12.0 - 15.0 g/dL 15.8(H) 15.4 14.2  Hematocrit 36.0 - 46.0 % 46.7(H) 45.8(H) 43.9  Platelets 150.0 - 400.0 K/uL 185.0 183 181    Lab Results  Component Value Date/Time   VD25OH 79.38 09/20/2020 02:47 PM    Clinical ASCVD: No  The 10-year ASCVD risk score Mikey Bussing DC Jr., et al., 2013) is: 5.7%   Values used to calculate the score:     Age: 70 years     Sex: Female     Is Non-Hispanic African American: No     Diabetic: No     Tobacco smoker: No     Systolic Blood Pressure: 115 mmHg     Is BP treated: No     HDL Cholesterol: 67.4 mg/dL     Total Cholesterol: 188 mg/dL    Depression screen [facility] 2/9 09/20/2020 05/16/2020 08/03/2019  Decreased Interest 0 0 0  Down, Depressed, Hopeless 0 0 0  PHQ - 2 Score 0 0 0  Altered sleeping 0 - 0  Tired, decreased energy 0 - 0  Change in appetite 0 - 0  Feeling bad or failure about yourself  0 - 0  Trouble concentrating 0 - 0  Moving slowly or fidgety/restless 0 - 0  Suicidal thoughts 0 - 0  PHQ-9 Score 0 - 0     Social History   Tobacco Use  Smoking Status Never  Smokeless Tobacco Never   BP Readings from Last 3 Encounters:  09/20/20 108/84  05/16/20 110/70  02/29/20 100/72   Pulse Readings from Last 3 Encounters:  09/20/20 84  05/16/20 73  02/29/20 80   Wt Readings from Last 3 Encounters:  09/20/20 99 lb 6.4 oz (45.1 kg)  05/16/20 97 lb 12.8 oz (44.4 kg)  02/29/20 96 lb 12.8 oz (43.9 kg)   BMI Readings from Last 3 Encounters:  09/20/20 16.54 kg/m  05/16/20 16.27 kg/m  02/29/20 16.11 kg/m    Assessment/Interventions: Review of patient past medical history, allergies, medications, health status, including review of consultants reports, laboratory and other test data, was  performed as part of comprehensive evaluation and provision of chronic care management services.   SDOH:  (Social Determinants of Health) assessments and interventions performed: Yes  SDOH Screenings   Alcohol Screen: Not on file  Depression (PHQ2-9): Low Risk    PHQ-2 Score: 0  Financial Resource Strain: Not on file  Food Insecurity: Not on file  Housing: Not on file  Physical Activity: Not on file  Social Connections: Not on file  Stress: Not on file  Tobacco Use: Low Risk    Smoking Tobacco Use: Never   Smokeless Tobacco Use: Never  Transportation Needs: Not on file    [place]  No Known Allergies  Medications Reviewed Today     Reviewed by Tomasa Blase, [facility] (Pharmacist) on 12/11/20 at 1644  Med List Status: <None>   Medication Order Taking? Sig Documenting Provider Last Dose Status Informant  ALPRAZolam (XANAX) 0.25 MG tablet [id number] Yes TAKE 1 TABLET BY MOUTH AT BEDTIME AS NEEDED FOR SLEEP OR ANXIETY Hoyt Koch, MD Taking Active   Calcium Carbonate-Vit D-Min (CALCIUM 1200 PO) [id number] Yes Take 2 tablets by mouth daily.  [provider] Taking Active   clobetasol (TEMOVATE) 0.05 % external solution [id number] Yes Apply 1 application topically 2 (two) times daily.  Patient taking differently: Apply 1 application topically 2 (two) times daily as needed.   Hoyt Koch, MD Taking Active   ibuprofen (ADVIL) 200 MG tablet [id number] Yes Take 200 mg by mouth 2 (two) times daily as needed. [provider] Taking Active   Ibuprofen-diphenhydrAMINE HCl (ADVIL PM) 200-25 MG CAPS [id number] Yes Take 1 tablet by mouth at bedtime. As needed [provider] Taking Active   minoxidil (ROGAINE) 2 % external solution [id number] Yes Apply 1 application topically at bedtime. Put in 3 places [provider] Taking Active   Multiple Vitamin (MULTIVITAMIN WITH MINERALS) TABS tablet [id number] Yes Take 1 tablet by mouth daily.   Patient taking differently: Take 1 tablet by mouth daily. Lucienne Minks, NP Taking Active   Patient not taking:  Discontinued 12/11/20 1644 (Patient Preference)             Patient Active Problem List   Diagnosis Date Noted   Preop examination 02/29/2020   Routine general medical examination at a health care facility 03/19/2018   [facility] (acromioclavicular) arthritis 09/30/2017   Pre-operative cardiovascular examination 07/11/2015   Nodule of left external ear 05/31/2014   Hashimoto's disease 11/08/2013   Depression with anxiety 07/07/2013   Right thyroid nodule 12/27/2012   OSTEOPOROSIS 10/09/2009   ADENOCARCINOMA, BREAST, HX OF 10/06/2007    Immunization History  Administered Date(s) Administered   PFIZER(Purple Top)SARS-COV-2 Vaccination 08/07/2019, 08/31/2019    Conditions to be addressed/monitored:  Depression, Anxiety, Osteoporosis, and Insomnia  Care Plan : CCM Care Plan  Updates made by Tomasa Blase, [place] since 12/11/2020 12:00 AM     Problem: depression, anxiety, insomnia, and osteoporosis   Priority: High  Onset Date: 12/11/2020     Long-Range Goal: Disease Management   Start Date: 12/11/2020  Expected End Date: 06/12/2021  This Visit's Progress: On track  Priority: High  Note:   Current Barriers:  Unable to independently monitor therapeutic efficacy Unable to achieve control of depression / anxiety    Pharmacist Clinical Goal(s):  Patient will achieve adherence to monitoring guidelines and medication adherence to achieve therapeutic efficacy achieve control of depression and anxiety as evidenced by improved mood / sleep quality through collaboration with PharmD and provider.   Interventions: 1:1 collaboration with Hoyt Koch, MD regarding development and update of comprehensive plan of care as evidenced by provider attestation and co-signature Inter-disciplinary care team collaboration (see longitudinal plan of  care) Comprehensive medication review performed; medication list updated in electronic medical record  Depression/Anxiety/ Insomnia (Goal: Mood control / promotion of quality sleep / prevention of anxiety attacks) -Uncontrolled -Current treatment: Sertraline 37m daily (started 11/29/2020) - no longer taking- reports to diarrhea, HA, became hyper-aware  Alprazolam 0.272m- 1 tablet at bedtime if needed  -Medications previously tried/failed: duloxetine -  caused worsening cheek pains, Sertraline - caused diarrhea, HA/ Mirtazepine - increased appetite, Wellbutrin - flushing, Escitalopram - could not recall  - per notes "caused too many side effects", Trazodone - rapid heart rate  -PHQ9: unable to complete today  -GAD7: unable to complete today  -currently following with psychologist  -Educated on Benefits of medication for symptom control Benefits of cognitive-behavioral therapy with or without medication -Recommended to start paroxetine 53m daily  - Patient educated about mechanism of action and adverse reactions from medication, also discussed that medication can take 4-6 weeks to take full effect   Osteoporosis / Osteopenia (Goal prevention of fractures / prevention of disease progression ) -Controlled -Last DEXA Scan: 2011   T-Score right femoral neck: -2.8  T-Score lumbar spine: -1.25  10-year probability of major osteoporotic fracture: 15.5%  10-year probability of hip fracture: 4.0% -Patient is a candidate for pharmacologic treatment due to T-Score < -2.5 in femoral neck and T-Score -1.0 to -2.5 and 10-year risk of hip fracture > 3% -Current treatment  Calcium/ VitD - 12069m 1000 units - 2 tablets daily  -Medications previously tried: Alendronate - did not tolerate due to GI side effects - not eligible for SERM due to history of hormone receptor positive breast cancer  -Recommended to continue current medication  Health Maintenance -Vaccine gaps: Shingles, COVID, Pneumonia  vaccines  -Current therapy:  Clobestasol 0.05% external solution - applied twice daily  Monoxidil 2% external solution - applied nightly at bedtime  Multivitamin - 1 tablet daily  Ibuprofen 20041m 1 tablet twice daily if needed  Ibuprofen PM - 1 tablet nightly if needed  -Educated on Herbal supplement research is limited and benefits usually cannot be proven Cost vs benefit of each product must be carefully weighed by individual consumer Supplements may interfere with prescription drugs (NSAIDS with antidepressants) - patient voiced understanding  -Patient is satisfied with current therapy and denies issues -Recommended to continue current medication  Patient Goals/Self-Care Activities Patient will:  - take medications as prescribed  Follow Up Plan: Telephone follow up appointment with care management team member scheduled for: The patient has been provided with contact information for the care management team and has been advised to call with any health related questions or concerns.     Medication Assistance: None required.  Patient affirms current coverage meets needs.  Compliance/Adherence/Medication fill history: Care Gaps: Dexa Scan, Mammogram   Patient[facility]  [id number] [id number]: [phone]: [id number]: [phone]: [phone]i[id number]: [phone]: [phone] pill box? No - able to manage without  Pt endorses 100% compliance   Care Plan and Follow Up Patient Decision:  Patient agrees to Care Plan and Follow-up.  Plan: Telephone follow up appointment with care management team member scheduled for:  6 weeks and The patient has been  provided with contact information for the care management team and has been advised to call with any health related questions or concerns.   DaniTomasa BlasearmD Clinical Pharmacist, [place]

## 2020-12-11 ENCOUNTER — Ambulatory Visit (INDEPENDENT_AMBULATORY_CARE_PROVIDER_SITE_OTHER): Payer: Medicare Other

## 2020-12-11 ENCOUNTER — Other Ambulatory Visit: Payer: Self-pay | Admitting: Internal Medicine

## 2020-12-11 ENCOUNTER — Other Ambulatory Visit: Payer: Self-pay

## 2020-12-11 DIAGNOSIS — M810 Age-related osteoporosis without current pathological fracture: Secondary | ICD-10-CM | POA: Diagnosis not present

## 2020-12-11 DIAGNOSIS — F418 Other specified anxiety disorders: Secondary | ICD-10-CM | POA: Diagnosis not present

## 2020-12-11 NOTE — Patient Instructions (Signed)
Visit Information   PATIENT GOALS:   Goals Addressed             This Visit's Progress    Track and Manage My Symptoms-Depression       Timeframe:  Long-Range Goal Priority:  High Start Date: 12/11/2020                            Expected End Date:  06/12/2021                     Follow Up Date 01/22/2021    - avoid negative self-talk - exercise at least 2 to 3 times per week - have a plan for how to handle bad days - journal feelings and what helps to feel better or worse - spend time or talk with others at least 2 to 3 times per week - watch for early signs of feeling worse    Why is this important?   Keeping track of your progress will help your treatment team find the right mix of medicine and therapy for you.  Write in your journal every day.  Day-to-day changes in depression symptoms are normal. It may be more helpful to check your progress at the end of each week instead of every day.            Consent to CCM Services: Cheryl Lawson was given information about Chronic Care Management services today including:  CCM service includes personalized support from designated clinical staff supervised by her physician, including individualized plan of care and coordination with other care providers 24/7 contact phone numbers for assistance for urgent and routine care needs. Service will only be billed when office clinical staff spend 20 minutes or more in a month to coordinate care. Only one practitioner may furnish and bill the service in a calendar month. The patient may stop CCM services at any time (effective at the end of the month) by phone call to the office staff. The patient will be responsible for cost sharing (co-pay) of up to 20% of the service fee (after annual deductible is met).  Patient agreed to services and verbal consent obtained.   Patient verbalizes understanding of instructions provided today and agrees to view in Cheryl Lawson.   Telephone follow up  appointment with care management team member scheduled for: 6 weeks  The patient has been provided with contact information for the care management team and has been advised to call with any health related questions or concerns.   Cheryl Lawson, PharmD Clinical Pharmacist, [facility]   CLINICAL CARE PLAN: Patient Care Plan: CCM Care Plan     Problem Identified: depression, anxiety, insomnia, and osteoporosis   Priority: High  Onset Date: 12/11/2020     Long-Range Goal: Disease Management   Start Date: 12/11/2020  Expected End Date: 06/12/2021  This Visit's Progress: On track  Priority: High  Note:   Current Barriers:  Unable to independently monitor therapeutic efficacy Unable to achieve control of depression / anxiety    Pharmacist Clinical Goal(s):  Patient will achieve adherence to monitoring guidelines and medication adherence to achieve therapeutic efficacy achieve control of depression and anxiety as evidenced by improved mood / sleep quality through collaboration with PharmD and provider.   Interventions: 1:1 collaboration with Hoyt Koch, MD regarding development and update of comprehensive plan of care as evidenced by provider attestation and co-signature Inter-disciplinary care team collaboration (see longitudinal  plan of care) Comprehensive medication review performed; medication list updated in electronic medical record  Depression/Anxiety/ Insomnia (Goal: Mood control / promotion of quality sleep / prevention of anxiety attacks) -Uncontrolled -Current treatment: Sertraline 40m daily (started 11/29/2020) - no longer taking- reports to diarrhea, HA, became hyper-aware  Alprazolam 0.247m- 1 tablet at bedtime if needed  -Medications previously tried/failed: duloxetine - caused worsening cheek pains, Sertraline - caused diarrhea, HA/ Mirtazepine - increased appetite, Wellbutrin - flushing, Escitalopram - could not recall  - per notes "caused too  many side effects", Trazodone - rapid heart rate  -PHQ9: unable to complete today  -GAD7: unable to complete today  -currently following with psychologist  -Educated on Benefits of medication for symptom control Benefits of cognitive-behavioral therapy with or without medication -Recommended to start paroxetine 101maily  - Patient educated about mechanism of action and adverse reactions from medication, also discussed that medication can take 4-6 weeks to take full effect   Osteoporosis / Osteopenia (Goal prevention of fractures / prevention of disease progression ) -Controlled -Last DEXA Scan: 2011   T-Score right femoral neck: -2.8  T-Score lumbar spine: -1.25  10-year probability of major osteoporotic fracture: 15.5%  10-year probability of hip fracture: 4.0% -Patient is a candidate for pharmacologic treatment due to T-Score < -2.5 in femoral neck and T-Score -1.0 to -2.5 and 10-year risk of hip fracture > 3% -Current treatment  Calcium/ VitD - 1200m79m000 units - 2 tablets daily  -Medications previously tried: Alendronate - did not tolerate due to GI side effects - not eligible for SERM due to history of hormone receptor positive breast cancer  -Recommended to continue current medication  Health Maintenance -Vaccine gaps: Shingles, COVID, Pneumonia vaccines  -Current therapy:  Clobestasol 0.05% external solution - applied twice daily  Monoxidil 2% external solution - applied nightly at bedtime  Multivitamin - 1 tablet daily  Ibuprofen 200mg75m tablet twice daily if needed  Ibuprofen PM - 1 tablet nightly if needed  -Educated on Herbal supplement research is limited and benefits usually cannot be proven Cost vs benefit of each product must be carefully weighed by individual consumer Supplements may interfere with prescription drugs (NSAIDS with antidepressants) - patient voiced understanding  -Patient is satisfied with current therapy and denies issues -Recommended to  continue current medication  Patient Goals/Self-Care Activities Patient will:  - take medications as prescribed  Follow Up Plan: Telephone follow up appointment with care management team member scheduled for: The patient has been provided with contact information for the care management team and has been advised to call with any health related questions or concerns.

## 2020-12-14 ENCOUNTER — Other Ambulatory Visit: Payer: Self-pay | Admitting: Internal Medicine

## 2020-12-14 MED ORDER — PAROXETINE HCL 10 MG PO TABS
10.0000 mg | ORAL_TABLET | Freq: Every day | ORAL | 1 refills | Status: DC
Start: 2020-12-14 — End: 2021-02-08

## 2021-01-10 ENCOUNTER — Other Ambulatory Visit: Payer: Self-pay | Admitting: Internal Medicine

## 2021-01-22 ENCOUNTER — Other Ambulatory Visit: Payer: Self-pay

## 2021-01-22 ENCOUNTER — Ambulatory Visit (INDEPENDENT_AMBULATORY_CARE_PROVIDER_SITE_OTHER): Payer: Medicare Other

## 2021-01-22 DIAGNOSIS — M810 Age-related osteoporosis without current pathological fracture: Secondary | ICD-10-CM

## 2021-01-22 DIAGNOSIS — F418 Other specified anxiety disorders: Secondary | ICD-10-CM | POA: Diagnosis not present

## 2021-01-22 NOTE — Patient Instructions (Addendum)
Visit Information  PATIENT GOALS:  Goals Addressed             This Visit's Progress    Track and Manage My Symptoms-Depression   On track    Timeframe:  Long-Range Goal Priority:  High Start Date: 12/11/2020                            Expected End Date:  06/12/2021                     Follow Up Date 01/22/2021    - avoid negative self-talk - exercise at least 2 to 3 times per week - have a plan for how to handle bad days - journal feelings and what helps to feel better or worse - spend time or talk with others at least 2 to 3 times per week - watch for early signs of feeling worse    Why is this important?   Keeping track of your progress will help your treatment team find the right mix of medicine and therapy for you.  Write in your journal every day.  Day-to-day changes in depression symptoms are normal. It may be more helpful to check your progress at the end of each week instead of every day.           Patient verbalizes understanding of instructions provided today and agrees to view in [place].   Telephone follow up appointment with care management team member scheduled for: 3 months The patient has been provided with contact information for the care management team and has been advised to call with any health related questions or concerns.   Tomasa Blase, PharmD Clinical Pharmacist, [place]

## 2021-01-22 NOTE — Progress Notes (Signed)
Chronic Care Management Pharmacy Note  01/22/2021 Name:  Cheryl Lawson MRN:  [id number] DOB:  06-01-1951  Summary: - Patient reports that since starting paroxetine she feels that her depression / mood has been improved, reports she has been taking for about 3 weeks, feels she is continue to improve and adjust to medication  Recommendations/Changes made from today's visit: - Recommending for patient to continue paroxetine 67m daily , patient will reach out should she feel that she wants to increase dose / if she is having any side effects from medication   Subjective: Cheryl Lawson an 70y.o. year old female who is a primary patient of CHoyt Koch MD.  The CCM team was consulted for assistance with disease management and care coordination needs.    Engaged with patient by telephone for initial visit in response to provider referral for pharmacy case management and/or care coordination services.   Consent to Services:  The patient was given the following information about Chronic Care Management services today, agreed to services, and gave verbal consent: 1. CCM service includes personalized support from designated clinical staff supervised by the primary care provider, including individualized plan of care and coordination with other care providers 2. 24/7 contact phone numbers for assistance for urgent and routine care needs. 3. Service will only be billed when office clinical staff spend 20 minutes or more in a month to coordinate care. 4. Only one practitioner may furnish and bill the service in a calendar month. 5.The patient may stop CCM services at any time (effective at the end of the month) by phone call to the office staff. 6. The patient will be responsible for cost sharing (co-pay) of up to 20% of the service fee (after annual deductible is met). Patient agreed to services and consent obtained.  Patient Care Team: CHoyt Koch MD as PCP - General (Internal  Medicine) SDelice BisonDDarnelle Maffucci [facility] Pharmacist (Pharmacist)  Recent office visits: No changes since last visit   Recent consult visits: No changes since last visit   Hospital visits: None in previous 6 months  Objective:  Lab Results  Component Value Date   CREATININE 0.95 09/25/2020   BUN 18 09/25/2020   GFR 62.02 09/25/2020   GFRNONAA 64 03/02/2020   GFRAA 74 03/02/2020   NA 137 09/25/2020   K 4.3 09/25/2020   CALCIUM 9.5 09/25/2020   CO2 29 09/25/2020   GLUCOSE 76 09/25/2020    Lab Results  Component Value Date/Time   GFR 62.02 09/25/2020 02:55 PM   GFR 55.65 (L) 09/20/2020 02:47 PM    Last diabetic Eye exam:  No results found for: HMDIABEYEEXA  Last diabetic Foot exam:  No results found for: HMDIABFOOTEX   Lab Results  Component Value Date   CHOL 188 08/03/2019   HDL 67.40 08/03/2019   LDLCALC 101 (H) 08/03/2019   TRIG 102.0 08/03/2019   CHOLHDL 3 08/03/2019    Hepatic Function Latest Ref Rng & Units 09/20/2020 03/02/2020 02/29/2020  Total Protein 6.0 - 8.3 g/dL 7.1 6.3 6.9  Albumin 3.5 - 5.2 g/dL 4.8 - -  AST 0 - 37 U/L 36 29 38(H)  ALT 0 - 35 U/L _0 Alk Phosphatase 39 - 117 U/L 62 - -  Total Bilirubin 0.2 - 1.2 mg/dL 0.5 0.6 0.6  Bilirubin, Direct 0.0 - 0.3 mg/dL - - -    Lab Results  Component Value Date/Time   TSH 3.18 09/20/2020 02:47 PM  TSH 1.91 03/19/2018 01:52 PM   FREET4 0.89 09/20/2020 02:47 PM   FREET4 0.91 03/19/2018 01:52 PM    CBC Latest Ref Rng & Units 09/20/2020 02/29/2020 12/12/2019  WBC 4.0 - 10.5 K/uL 5.5 4.2 4.0  Hemoglobin 12.0 - 15.0 g/dL 15.8(H) 15.4 14.2  Hematocrit 36.0 - 46.0 % 46.7(H) 45.8(H) 43.9  Platelets 150.0 - 400.0 K/uL 185.0 183 181    Lab Results  Component Value Date/Time   VD25OH 79.38 09/20/2020 02:47 PM    Clinical ASCVD: No  The 10-year ASCVD risk score Mikey Bussing DC Jr., et al., 2013) is: 5.7%   Values used to calculate the score:     Age: 70 years     Sex: Female     Is Non-Hispanic African  American: No     Diabetic: No     Tobacco smoker: No     Systolic Blood Pressure: 379 mmHg     Is BP treated: No     HDL Cholesterol: 67.4 mg/dL     Total Cholesterol: 188 mg/dL    Depression screen [facility] 2/9 09/20/2020 05/16/2020 08/03/2019  Decreased Interest 0 0 0  Down, Depressed, Hopeless 0 0 0  PHQ - 2 Score 0 0 0  Altered sleeping 0 - 0  Tired, decreased energy 0 - 0  Change in appetite 0 - 0  Feeling bad or failure about yourself  0 - 0  Trouble concentrating 0 - 0  Moving slowly or fidgety/restless 0 - 0  Suicidal thoughts 0 - 0  PHQ-9 Score 0 - 0     Social History   Tobacco Use  Smoking Status Never  Smokeless Tobacco Never   BP Readings from Last 3 Encounters:  09/20/20 108/84  05/16/20 110/70  02/29/20 100/72   Pulse Readings from Last 3 Encounters:  09/20/20 84  05/16/20 73  02/29/20 80   Wt Readings from Last 3 Encounters:  09/20/20 99 lb 6.4 oz (45.1 kg)  05/16/20 97 lb 12.8 oz (44.4 kg)  02/29/20 96 lb 12.8 oz (43.9 kg)   BMI Readings from Last 3 Encounters:  09/20/20 16.54 kg/m  05/16/20 16.27 kg/m  02/29/20 16.11 kg/m    Assessment/Interventions: Review of patient past medical history, allergies, medications, health status, including review of consultants reports, laboratory and other test data, was performed as part of comprehensive evaluation and provision of chronic care management services.   SDOH:  (Social Determinants of Health) assessments and interventions performed: Yes  SDOH Screenings   Alcohol Screen: Not on file  Depression (PHQ2-9): Low Risk    PHQ-2 Score: 0  Financial Resource Strain: Not on file  Food Insecurity: Not on file  Housing: Not on file  Physical Activity: Not on file  Social Connections: Not on file  Stress: Not on file  Tobacco Use: Low Risk    Smoking Tobacco Use: Never   Smokeless Tobacco Use: Never  Transportation Needs: Not on file    [place]  No Known Allergies  Medications Reviewed Today      Reviewed by Tomasa Blase, [facility] (Pharmacist) on 01/22/21 at [place] List Status: <None>   Medication Order Taking? Sig Documenting Provider Last Dose Status Informant  ALPRAZolam (XANAX) 0.25 MG tablet [id number] Yes TAKE 1 TABLET BY MOUTH AT BEDTIME AS NEEDED FOR SLEEP OR ANXIETY Hoyt Koch, MD Taking Active   Calcium Carbonate-Vit D-Min (CALCIUM 1200 PO) [id number] Yes Take 2 tablets by mouth daily.  [provider] Taking Active  clobetasol (TEMOVATE) 0.05 % external solution [id number] Yes Apply 1 application topically 2 (two) times daily.  Patient taking differently: Apply 1 application topically 2 (two) times daily as needed.   Hoyt Koch, MD Taking Active   ibuprofen (ADVIL) 200 MG tablet [id number] Yes Take 200 mg by mouth 2 (two) times daily as needed. [provider] Taking Active   Ibuprofen-diphenhydrAMINE HCl (ADVIL PM) 200-25 MG CAPS [id number] Yes Take 1 tablet by mouth at bedtime. As needed [provider] Taking Active   minoxidil (ROGAINE) 2 % external solution [id number] Yes Apply 1 application topically at bedtime. Put in 3 places [provider] Taking Active   Multiple Vitamin (MULTIVITAMIN WITH MINERALS) TABS tablet [id number] Yes Take 1 tablet by mouth daily.  Patient taking differently: Take 1 tablet by mouth daily. Celebrate   Patrecia Pour, NP Taking Active   PARoxetine (PAXIL) 10 MG tablet [id number] Yes Take 1 tablet (10 mg total) by mouth daily. Hoyt Koch, MD Taking Active             Patient Active Problem List   Diagnosis Date Noted   Preop examination 02/29/2020   Routine general medical examination at a health care facility 03/19/2018   [facility] (acromioclavicular) arthritis 09/30/2017   Pre-operative cardiovascular examination 07/11/2015   Nodule of left external ear 05/31/2014   Hashimoto's disease 11/08/2013   Depression with anxiety 07/07/2013   Right thyroid nodule 12/27/2012    Osteoporosis 10/09/2009   ADENOCARCINOMA, BREAST, HX OF 10/06/2007    Immunization History  Administered Date(s) Administered   PFIZER(Purple Top)SARS-COV-2 Vaccination 08/07/2019, 08/31/2019    Conditions to be addressed/monitored:  Depression, Anxiety, Osteoporosis, and Insomnia  Care Plan : CCM Care Plan  Updates made by Tomasa Blase, [place] since 01/22/2021 12:00 AM     Problem: depression, anxiety, insomnia, and osteoporosis   Priority: High  Onset Date: 12/11/2020     Long-Range Goal: Disease Management   Start Date: 12/11/2020  Expected End Date: 06/12/2021  This Visit's Progress: On track  Recent Progress: On track  Priority: High  Note:   Current Barriers:  Unable to independently monitor therapeutic efficacy Unable to achieve control of depression / anxiety    Pharmacist Clinical Goal(s):  Patient will achieve adherence to monitoring guidelines and medication adherence to achieve therapeutic efficacy achieve control of depression and anxiety as evidenced by improved mood / sleep quality through collaboration with PharmD and provider.   Interventions: 1:1 collaboration with Hoyt Koch, MD regarding development and update of comprehensive plan of care as evidenced by provider attestation and co-signature Inter-disciplinary care team collaboration (see longitudinal plan of care) Comprehensive medication review performed; medication list updated in electronic medical record  Depression/Anxiety/ Insomnia (Goal: Mood control / promotion of quality sleep / prevention of anxiety attacks) -improved  -Current treatment: Paroxetine 50m - 1 tablet daily  Alprazolam 0.268m- 1 tablet at bedtime if needed  -Medications previously tried/failed: duloxetine - caused worsening cheek pains, Sertraline - caused diarrhea, HA/ Mirtazepine - increased appetite, Wellbutrin - flushing, Escitalopram - could not recall  - per notes "caused too many side effects", Trazodone -  rapid heart rate  -PHQ9: improved, could not quantify today, but patient reports to less extreme mood swings, able to cope better with situations  -currently following with psychologist  -Educated on Benefits of medication for symptom control Benefits of cognitive-behavioral therapy with or without medication -Recommended to start continue paroxetine 1032maily   Osteoporosis / Osteopenia (Goal  prevention of fractures / prevention of disease progression ) -Controlled -Last DEXA Scan: 2011   T-Score right femoral neck: -2.8  T-Score lumbar spine: -1.25  10-year probability of major osteoporotic fracture: 15.5%  10-year probability of hip fracture: 4.0% -Patient is a candidate for pharmacologic treatment due to T-Score < -2.5 in femoral neck and T-Score -1.0 to -2.5 and 10-year risk of hip fracture > 3% -Current treatment  Calcium/ VitD - 1252m/ 1000 units - 2 tablets daily  -Medications previously tried: Alendronate - did not tolerate due to GI side effects - not eligible for SERM due to history of hormone receptor positive breast cancer  -Recommended to continue current medication  Health Maintenance -Vaccine gaps: Shingles, COVID, Pneumonia vaccines  -Current therapy:  Clobestasol 0.05% external solution - applied twice daily  Monoxidil 2% external solution - applied nightly at bedtime  Multivitamin - 1 tablet daily  Ibuprofen 2038m- 1 tablet twice daily if needed  Ibuprofen PM - 1 tablet nightly if needed  -Educated on Herbal supplement research is limited and benefits usually cannot be proven Cost vs benefit of each product must be carefully weighed by individual consumer Supplements may interfere with prescription drugs (NSAIDS with antidepressants) - patient voiced understanding  -Patient is satisfied with current therapy and denies issues -Recommended to continue current medication  Patient Goals/Self-Care Activities Patient will:  - take medications as  prescribed  Follow Up Plan: Telephone follow up appointment with care management team member scheduled for: The patient has been provided with contact information for the care management team and has been advised to call with any health related questions or concerns.      Medication Assistance: None required.  Patient affirms current coverage meets needs.  Compliance/Adherence/Medication fill history: Care Gaps: Dexa Scan, Mammogram   [facility]  [id number] [place] [id number]: [phone]: [phone] Patient [place]one: [phone]: [phone]vil[place]144one: [phone]: [phone] pill box? No - able to manage without  Pt endorses 100% compliance   Care Plan and Follow Up Patient Decision:  Patient agrees to Care Plan and Follow-up.  Plan: Telephone follow up appointment with care management team member scheduled for:  3 months and The patient has been provided with contact information for the care management team and has been advised to call with any health related questions or concerns.   DanTomasa BlaseharmD Clinical Pharmacist, [place]

## 2021-01-24 ENCOUNTER — Encounter: Payer: Self-pay | Admitting: Internal Medicine

## 2021-01-30 DIAGNOSIS — H02402 Unspecified ptosis of left eyelid: Secondary | ICD-10-CM | POA: Diagnosis not present

## 2021-01-30 DIAGNOSIS — H2513 Age-related nuclear cataract, bilateral: Secondary | ICD-10-CM | POA: Diagnosis not present

## 2021-01-30 DIAGNOSIS — H04123 Dry eye syndrome of bilateral lacrimal glands: Secondary | ICD-10-CM | POA: Diagnosis not present

## 2021-02-08 ENCOUNTER — Other Ambulatory Visit: Payer: Self-pay | Admitting: Internal Medicine

## 2021-02-10 ENCOUNTER — Other Ambulatory Visit: Payer: Self-pay | Admitting: Internal Medicine

## 2021-03-19 ENCOUNTER — Encounter: Payer: Self-pay | Admitting: Internal Medicine

## 2021-03-26 ENCOUNTER — Other Ambulatory Visit: Payer: Self-pay | Admitting: Orthopaedic Surgery

## 2021-03-27 ENCOUNTER — Other Ambulatory Visit: Payer: Self-pay | Admitting: Specialist

## 2021-03-27 DIAGNOSIS — J0100 Acute maxillary sinusitis, unspecified: Secondary | ICD-10-CM

## 2021-03-27 DIAGNOSIS — G501 Atypical facial pain: Secondary | ICD-10-CM

## 2021-03-31 ENCOUNTER — Encounter: Payer: Self-pay | Admitting: Internal Medicine

## 2021-04-06 ENCOUNTER — Ambulatory Visit
Admission: RE | Admit: 2021-04-06 | Discharge: 2021-04-06 | Disposition: A | Discharge status: Home / Self Care | Payer: Medicare Other | Source: Ambulatory Visit | Attending: Specialist | Admitting: Specialist

## 2021-04-06 ENCOUNTER — Other Ambulatory Visit: Payer: Self-pay

## 2021-04-06 DIAGNOSIS — S022XXA Fracture of nasal bones, initial encounter for closed fracture: Secondary | ICD-10-CM | POA: Diagnosis not present

## 2021-04-06 DIAGNOSIS — J0100 Acute maxillary sinusitis, unspecified: Secondary | ICD-10-CM

## 2021-04-06 DIAGNOSIS — G501 Atypical facial pain: Secondary | ICD-10-CM

## 2021-04-06 DIAGNOSIS — Z853 Personal history of malignant neoplasm of breast: Secondary | ICD-10-CM | POA: Diagnosis not present

## 2021-04-17 ENCOUNTER — Other Ambulatory Visit: Payer: Self-pay | Admitting: Internal Medicine

## 2021-04-24 ENCOUNTER — Telehealth: Payer: Medicare Other

## 2021-05-29 ENCOUNTER — Other Ambulatory Visit: Payer: Self-pay

## 2021-05-29 ENCOUNTER — Ambulatory Visit (INDEPENDENT_AMBULATORY_CARE_PROVIDER_SITE_OTHER): Payer: Medicare Other

## 2021-05-29 DIAGNOSIS — M810 Age-related osteoporosis without current pathological fracture: Secondary | ICD-10-CM

## 2021-05-29 NOTE — Progress Notes (Signed)
Chronic Care Management Pharmacy Note  05/29/2021 Name:  Cheryl Lawson MRN:  [id number] DOB:  04-03-1951  Summary: - Patient reports that she feels that she has been doing well since last appointment, has continued paxil 97m daily - discussed with PCP about stopping / increasing medication - notes that she continued on 128mdaily dose and found that nerves and mood has become more stable   Recommendations/Changes made from today's visit: - Recommending for patient to continue paroxetine 1084maily , patient will reach out should she feel that she wants to increase dose / if she is having any side effects from medication   Subjective: Cheryl Lawson an 70 6[age]. year old female who is a primary patient of CraHoyt KochD.  The CCM team was consulted for assistance with disease management and care coordination needs.    Engaged with patient by telephone for follow up visit in response to provider referral for pharmacy case management and/or care coordination services.   Consent to Services:  The patient was given the following information about Chronic Care Management services today, agreed to services, and gave verbal consent: 1. CCM service includes personalized support from designated clinical staff supervised by the primary care provider, including individualized plan of care and coordination with other care providers 2. 24/7 contact phone numbers for assistance for urgent and routine care needs. 3. Service will only be billed when office clinical staff spend 20 minutes or more in a month to coordinate care. 4. Only one practitioner may furnish and bill the service in a calendar month. 5.The patient may stop CCM services at any time (effective at the end of the month) by phone call to the office staff. 6. The patient will be responsible for cost sharing (co-pay) of up to 20% of the service fee (after annual deductible is met). Patient agreed to services and consent  obtained.  Patient Care Team: CraHoyt KochD as PCP - General (Internal Medicine) [facility]zaDelice BisonnDarnelle [facility]PH[facility] Pharmacist (Pharmacist)  Recent office visits: No changes since last visit   Recent consult visits: No changes since last visit   Hospital visits: None in previous 6 months  Objective:  Lab Results  Component Value Date   CREATININE 0.95 09/25/2020   BUN 18 09/25/2020   GFR 62.02 09/25/2020   GFRNONAA 64 03/02/2020   GFRAA 74 03/02/2020   NA 137 09/25/2020   K 4.3 09/25/2020   CALCIUM 9.5 09/25/2020   CO2 29 09/25/2020   GLUCOSE 76 09/25/2020    Lab Results  Component Value Date/Time   GFR 62.02 09/25/2020 02:55 PM   GFR 55.65 (L) 09/20/2020 02:47 PM    Last diabetic Eye exam:  No results found for: HMDIABEYEEXA  Last diabetic Foot exam:  No results found for: HMDIABFOOTEX   Lab Results  Component Value Date   CHOL 188 08/03/2019   HDL 67.40 08/03/2019   LDLCALC 101 (H) 08/03/2019   TRIG 102.0 08/03/2019   CHOLHDL 3 08/03/2019    Hepatic Function Latest Ref Rng & Units 09/20/2020 03/02/2020 02/29/2020  Total Protein 6.0 - 8.3 g/dL 7.1 6.3 6.9  Albumin 3.5 - 5.2 g/dL 4.8 - -  AST 0 - 37 U/L 36 29 38(H)  ALT 0 - 35 U/L _0 Alk Phosphatase 39 - 117 U/L 62 - -  Total Bilirubin 0.2 - 1.2 mg/dL 0.5 0.6 0.6  Bilirubin, Direct 0.0 - 0.3 mg/dL - - -  Lab Results  Component Value Date/Time   TSH 3.18 09/20/2020 02:47 PM   TSH 1.91 03/19/2018 01:52 PM   FREET4 0.89 09/20/2020 02:47 PM   FREET4 0.91 03/19/2018 01:52 PM    CBC Latest Ref Rng & Units 09/20/2020 02/29/2020 12/12/2019  WBC 4.0 - 10.5 K/uL 5.5 4.2 4.0  Hemoglobin 12.0 - 15.0 g/dL 15.8(H) 15.4 14.2  Hematocrit 36.0 - 46.0 % 46.7(H) 45.8(H) 43.9  Platelets 150.0 - 400.0 K/uL 185.0 183 181    Lab Results  Component Value Date/Time   VD25OH 79.38 09/20/2020 02:47 PM    Clinical ASCVD: No  The 10-year ASCVD risk score (Arnett DK, et al., 2019) is: 6.5%   Values used to  calculate the score:     Age: 62 years     Sex: Female     Is Non-Hispanic African American: No     Diabetic: No     Tobacco smoker: No     Systolic Blood Pressure: 465 mmHg     Is BP treated: No     HDL Cholesterol: 67.4 mg/dL     Total Cholesterol: 188 mg/dL    Depression screen [facility] 2/9 09/20/2020 05/16/2020 08/03/2019  Decreased Interest 0 0 0  Down, Depressed, Hopeless 0 0 0  PHQ - 2 Score 0 0 0  Altered sleeping 0 - 0  Tired, decreased energy 0 - 0  Change in appetite 0 - 0  Feeling bad or failure about yourself  0 - 0  Trouble concentrating 0 - 0  Moving slowly or fidgety/restless 0 - 0  Suicidal thoughts 0 - 0  PHQ-9 Score 0 - 0     Social History   Tobacco Use  Smoking Status Never  Smokeless Tobacco Never   BP Readings from Last 3 Encounters:  09/20/20 108/84  05/16/20 110/70  02/29/20 100/72   Pulse Readings from Last 3 Encounters:  09/20/20 84  05/16/20 73  02/29/20 80   Wt Readings from Last 3 Encounters:  09/20/20 99 lb 6.4 oz (45.1 kg)  05/16/20 97 lb 12.8 oz (44.4 kg)  02/29/20 96 lb 12.8 oz (43.9 kg)   BMI Readings from Last 3 Encounters:  09/20/20 16.54 kg/m  05/16/20 16.27 kg/m  02/29/20 16.11 kg/m    Assessment/Interventions: Review of patient past medical history, allergies, medications, health status, including review of consultants reports, laboratory and other test data, was performed as part of comprehensive evaluation and provision of chronic care management services.   SDOH:  (Social Determinants of Health) assessments and interventions performed: Yes  SDOH Screenings   Alcohol Screen: Not on file  Depression (PHQ2-9): Low Risk    PHQ-2 Score: 0  Financial Resource Strain: Not on file  Food Insecurity: Not on file  Housing: Not on file  Physical Activity: Not on file  Social Connections: Not on file  Stress: Not on file  Tobacco Use: Low Risk    Smoking Tobacco Use: Never   Smokeless Tobacco Use: Never   Passive Exposure:  Not on file  Transportation Needs: Not on file    [place]  No Known Allergies  Medications Reviewed Today     Reviewed by Tomasa Blase, [facility] (Pharmacist) on 01/22/21 at [place] List Status: <None>   Medication Order Taking? Sig Documenting Provider Last Dose Status Informant  ALPRAZolam (XANAX) 0.25 MG tablet [id number] Yes TAKE 1 TABLET BY MOUTH AT BEDTIME AS NEEDED FOR SLEEP OR ANXIETY Hoyt Koch, MD Taking Active  Calcium Carbonate-Vit D-Min (CALCIUM 1200 PO) [id number] Yes Take 2 tablets by mouth daily.  [provider] Taking Active   clobetasol (TEMOVATE) 0.05 % external solution [id number] Yes Apply 1 application topically 2 (two) times daily.  Patient taking differently: Apply 1 application topically 2 (two) times daily as needed.   Hoyt Koch, MD Taking Active   ibuprofen (ADVIL) 200 MG tablet [id number] Yes Take 200 mg by mouth 2 (two) times daily as needed. [provider] Taking Active   Ibuprofen-diphenhydrAMINE HCl (ADVIL PM) 200-25 MG CAPS [id number] Yes Take 1 tablet by mouth at bedtime. As needed [provider] Taking Active   minoxidil (ROGAINE) 2 % external solution [id number] Yes Apply 1 application topically at bedtime. Put in 3 places [provider] Taking Active   Multiple Vitamin (MULTIVITAMIN WITH MINERALS) TABS tablet [id number] Yes Take 1 tablet by mouth daily.  Patient taking differently: Take 1 tablet by mouth daily. Celebrate   Patrecia Pour, NP Taking Active   PARoxetine (PAXIL) 10 MG tablet [id number] Yes Take 1 tablet (10 mg total) by mouth daily. Hoyt Koch, MD Taking Active             Patient Active Problem List   Diagnosis Date Noted   Preop examination 02/29/2020   Routine general medical examination at a health care facility 03/19/2018   [facility] (acromioclavicular) arthritis 09/30/2017   Pre-operative cardiovascular examination 07/11/2015   Nodule of left external ear  05/31/2014   Hashimoto's disease 11/08/2013   Depression with anxiety 07/07/2013   Right thyroid nodule 12/27/2012   Osteoporosis 10/09/2009   ADENOCARCINOMA, BREAST, HX OF 10/06/2007    Immunization History  Administered Date(s) Administered   PFIZER(Purple Top)SARS-COV-2 Vaccination 08/07/2019, 08/31/2019    Conditions to be addressed/monitored:  Depression, Anxiety, Osteoporosis, and Insomnia  Care Plan : CCM Care Plan  Updates made by Tomasa Blase, [place] since 05/29/2021 12:00 AM     Problem: depression, anxiety, insomnia, and osteoporosis   Priority: High  Onset Date: 12/11/2020     Long-Range Goal: Disease Management   Start Date: 12/11/2020  Expected End Date: 05/29/2022  This Visit's Progress: On track  Recent Progress: On track  Priority: High  Note:   Current Barriers:  Unable to independently monitor therapeutic efficacy Maintain control of depression / anxiety    Pharmacist Clinical Goal(s):  Patient will achieve adherence to monitoring guidelines and medication adherence to achieve therapeutic efficacy achieve control of depression and anxiety as evidenced by improved mood / sleep quality through collaboration with PharmD and provider.   Interventions: 1:1 collaboration with Hoyt Koch, MD regarding development and update of comprehensive plan of care as evidenced by provider attestation and co-signature Inter-disciplinary care team collaboration (see longitudinal plan of care) Comprehensive medication review performed; medication list updated in electronic medical record  Depression/Anxiety/ Insomnia (Goal: Mood control / promotion of quality sleep / prevention of anxiety attacks) -improved  -Current treatment: Paroxetine 1m - 1 tablet daily  Alprazolam 0.23m- 1 tablet at bedtime if needed  -Medications previously tried/failed: duloxetine - caused worsening cheek pains, Sertraline - caused diarrhea, HA/ Mirtazepine - increased appetite,  Wellbutrin - flushing, Escitalopram - could not recall  - per notes "caused too many side effects", Trazodone - rapid heart rate  -PHQ9: improved, could not quantify today, but patient reports to less extreme mood swings, able to cope better with situations, does not find she is crying for no reason as frequently  -currently following with  psychologist  -Educated on Benefits of medication for symptom control Benefits of cognitive-behavioral therapy with or without medication -Recommended to continue paroxetine 92m daily   Osteoporosis / Osteopenia (Goal prevention of fractures / prevention of disease progression ) -Controlled -Last DEXA Scan: 2011   T-Score right femoral neck: -2.8  T-Score lumbar spine: -1.25  10-year probability of major osteoporotic fracture: 15.5%  10-year probability of hip fracture: 4.0% -Patient is a candidate for pharmacologic treatment due to T-Score < -2.5 in femoral neck and T-Score -1.0 to -2.5 and 10-year risk of hip fracture > 3% -Current treatment  Calcium/ VitD - 1207m 1000 units - 2 tablets daily  -Medications previously tried: Alendronate - did not tolerate due to GI side effects - not eligible for SERM due to history of hormone receptor positive breast cancer  -Recommended to continue current medication  Health Maintenance -Vaccine gaps: Shingles, COVID, Pneumonia vaccines  -Current therapy:  Clobestasol 0.05% external solution - applied twice daily  Monoxidil 2% external solution - applied nightly at bedtime  Multivitamin - 1 tablet daily  Ibuprofen 20077m 1 tablet twice daily if needed  Ibuprofen PM - 1 tablet nightly if needed  -Educated on Herbal supplement research is limited and benefits usually cannot be proven Cost vs benefit of each product must be carefully weighed by individual consumer Supplements may interfere with prescription drugs (NSAIDS with antidepressants) - patient voiced understanding  -Patient is satisfied with current  therapy and denies issues -Recommended to continue current medication  Patient Goals/Self-Care Activities Patient will:  - take medications as prescribed  Follow Up Plan: Telephone follow up appointment with care management team member scheduled for: 6 months The patient has been provided with contact information for the care management team and has been advised to call with any health related questions or concerns.       Medication Assistance: None required.  Patient affirms current coverage meets needs.  Compliance/Adherence/Medication fill history: Care Gaps: Dexa Scan, Mammogram   Patient[facility]  [id number]: [phone]: [phone] [id number]: [phone]: [id number]: [phone]: [phone] pill box? No - able to manage without  Pt endorses 100% compliance   Care Plan and Follow Up Patient Decision:  Patient agrees to Care Plan and Follow-up.  Plan: Telephone follow up appointment with care management team member scheduled for:  6 months and The patient has been provided with contact information for the care management team and has been advised to call with any health related questions or concerns.   DaniTomasa BlasearmD Clinical Pharmacist, [place]

## 2021-05-29 NOTE — Patient Instructions (Signed)
Visit Information  Following are the goals we discussed today:   Track and Manage My Symptoms - Depression   Timeframe:  Long-Range Goal Priority:  High Start Date: 12/11/2020                            Expected End Date:  06/12/2022                     Follow Up Date 11/22/2021   - avoid negative self-talk - exercise at least 2 to 3 times per week - have a plan for how to handle bad days - journal feelings and what helps to feel better or worse - spend time or talk with others at least 2 to 3 times per week - watch for early signs of feeling worse    Why is this important?   Keeping track of your progress will help your treatment team find the right mix of medicine and therapy for you.  Write in your journal every day.  Day-to-day changes in depression symptoms are normal. It may be more helpful to check your progress at the end of each week instead of every day.    Plan: Telephone follow up appointment with care management team member scheduled for:  6 months The patient has been provided with contact information for the care management team and has been advised to call with any health related questions or concerns.   Tomasa Blase, PharmD Clinical Pharmacist, [facility]    Please call the care guide team at [phone] if you need to cancel or reschedule your appointment.   Patient verbalizes understanding of instructions provided today and agrees to view in [place].

## 2021-06-04 ENCOUNTER — Encounter: Payer: Self-pay | Admitting: Internal Medicine

## 2021-06-12 ENCOUNTER — Other Ambulatory Visit: Payer: Self-pay | Admitting: Internal Medicine

## 2021-06-15 DIAGNOSIS — F32A Depression, unspecified: Secondary | ICD-10-CM

## 2021-06-15 DIAGNOSIS — M810 Age-related osteoporosis without current pathological fracture: Secondary | ICD-10-CM

## 2021-07-01 ENCOUNTER — Ambulatory Visit: Payer: Medicare Other | Admitting: Internal Medicine

## 2021-07-25 ENCOUNTER — Encounter: Payer: Self-pay | Admitting: Internal Medicine

## 2021-07-26 MED ORDER — VALACYCLOVIR HCL 1 G PO TABS: 1000.0000 mg | ORAL_TABLET | Freq: Three times a day (TID) | ORAL | 0 refills | Status: AC

## 2021-08-12 ENCOUNTER — Other Ambulatory Visit: Payer: Self-pay | Admitting: Internal Medicine

## 2021-08-15 DIAGNOSIS — C50911 Malignant neoplasm of unspecified site of right female breast: Secondary | ICD-10-CM | POA: Diagnosis not present

## 2021-08-15 DIAGNOSIS — Z170 Estrogen receptor positive status [ER+]: Secondary | ICD-10-CM | POA: Diagnosis not present

## 2021-08-15 DIAGNOSIS — Z9229 Personal history of other drug therapy: Secondary | ICD-10-CM | POA: Diagnosis not present

## 2021-08-15 DIAGNOSIS — Z923 Personal history of irradiation: Secondary | ICD-10-CM | POA: Diagnosis not present

## 2021-08-15 DIAGNOSIS — Z1231 Encounter for screening mammogram for malignant neoplasm of breast: Secondary | ICD-10-CM | POA: Diagnosis not present

## 2021-08-15 LAB — HM MAMMOGRAPHY

## 2021-09-23 DIAGNOSIS — H02402 Unspecified ptosis of left eyelid: Secondary | ICD-10-CM | POA: Diagnosis not present

## 2021-09-23 DIAGNOSIS — H2513 Age-related nuclear cataract, bilateral: Secondary | ICD-10-CM | POA: Diagnosis not present

## 2021-09-23 DIAGNOSIS — H00014 Hordeolum externum left upper eyelid: Secondary | ICD-10-CM | POA: Diagnosis not present

## 2021-09-23 DIAGNOSIS — H04123 Dry eye syndrome of bilateral lacrimal glands: Secondary | ICD-10-CM | POA: Diagnosis not present

## 2021-10-12 ENCOUNTER — Other Ambulatory Visit: Payer: Self-pay | Admitting: Internal Medicine

## 2021-10-23 ENCOUNTER — Telehealth: Payer: Self-pay

## 2021-10-23 NOTE — Progress Notes (Signed)
? ? ?Chronic Care Management ?Pharmacy Assistant  ? ?Name: THARON KITCH  MRN: [id number] DOB: 04-07-1951 ? ? ?Reason for Encounter: Disease State-General  ?  ? ?Recent office visits:  ?None since the last coordination call 05/29/21 ? ?Recent consult visits:  ?None since the last coordination call 05/29/21 ? ? ?Hospital visits:  ?None since the last coordination call 05/29/21 ? ? ?Medications: ?Outpatient Encounter Medications as of 10/23/2021  ?Medication Sig  ? ALPRAZolam (XANAX) 0.25 MG tablet Take 1 tablet (0.25 mg total) by mouth daily as needed for anxiety. Needs visit for refills  ? Calcium Carbonate-Vit D-Min (CALCIUM 1200 PO) Take 2 tablets by mouth daily.   ? clobetasol (TEMOVATE) 0.05 % external solution Apply 1 application topically 2 (two) times daily. (Patient taking differently: Apply 1 application topically 2 (two) times daily as needed.)  ? ibuprofen (ADVIL) 200 MG tablet Take 200 mg by mouth 2 (two) times daily as needed.  ? Ibuprofen-diphenhydrAMINE HCl (ADVIL PM) 200-25 MG CAPS Take 1 tablet by mouth at bedtime. As needed  ? minoxidil (ROGAINE) 2 % external solution Apply 1 application topically at bedtime. Put in 3 places  ? Multiple Vitamin (MULTIVITAMIN WITH MINERALS) TABS tablet Take 1 tablet by mouth daily. (Patient taking differently: Take 1 tablet by mouth daily. Celebrate)  ? PARoxetine (PAXIL) 10 MG tablet TAKE 1 TABLET BY MOUTH EVERY DAY  ? ?No facility-administered encounter medications on file as of 10/23/2021.  ? ?Contacted Berenice Primas for General Review Call ? ? ?Chart Review: ? ?Have there been any documented new, changed, or discontinued medications since last visit? No (If yes, include name, dose, frequency, date) ?Has there been any documented recent hospitalizations or ED visits since last visit with Clinical Pharmacist? No ?Brief Summary (including medication and/or Diagnosis changes): ? ? ?Adherence Review: ? ?Does the Clinical Pharmacist Assistant have access to adherence  rates? Yes ?Adherence rates for STAR metric medications (List medication(s)/day supply/ last 2 fill dates). ?Adherence rates for medications indicated for disease state being reviewed (List medication(s)/day supply/ last 2 fill dates). ?Does the patient have >5 day gap between last estimated fill dates for any of the above medications or other medication gaps? No ?Reason for medication gaps. ? ? ?Disease State Questions: ? ?Able to connect with Patient? Yes ?Did patient have any problems with their health recently? No ?Note problems and Concerns: ?Have you had any admissions or emergency room visits or worsening of your condition(s) since last visit? No ?Details of ED visit, hospital visit and/or worsening condition(s): ?Have you had any visits with new specialists or providers since your last visit? No ?Explain: ?Have you had any new health care problem(s) since your last visit? No ?New problem(s) reported: ?Have you run out of any of your medications since you last spoke with clinical pharmacist? No ?What caused you to run out of your medications? ?Are there any medications you are not taking as prescribed? No ?What kept you from taking your medications as prescribed? ?Are you having any issues or side effects with your medications? No ?Note of issues or side effects: ?Do you have any other health concerns or questions you want to discuss with your Clinical Pharmacist before your next visit? No ?Note additional concerns and questions from Patient. ?Are there any health concerns that you feel we can do a better job addressing? No ?Note Patient's response. ?Are you having any problems with any of the following since the last visit: (select all that apply) ? None ?  Details: ?12. Any falls since last visit? No ? Details: ?13. Any increased or uncontrolled pain since last visit? No ? Details: ?14. Next visit Type: telephone ?      Visit with:Clinical Pharmacist ?       Date:11/27/21 ?       Time:2 pm ? ?15. Additional  Details? No ?   ?Care Gaps: ?Colonoscopy-06/02/12 ?Diabetic Foot Exam-NA ?Mammogram-03/27/18 ?Ophthalmology-NA ?Dexa Scan - NA ?Annual Well Visit - NA ?Micro albumin-NA ?Hemoglobin A1c- NA ? ?Star Rating Drugs: ?None noted ? ? ?Ethelene Hal ?Clinical Pharmacist Assistant ?[phone]  ?

## 2021-11-20 ENCOUNTER — Other Ambulatory Visit: Payer: Self-pay | Admitting: Internal Medicine

## 2021-11-27 ENCOUNTER — Telehealth: Payer: Medicare Other

## 2021-12-04 DIAGNOSIS — H6120 Impacted cerumen, unspecified ear: Secondary | ICD-10-CM | POA: Diagnosis not present

## 2021-12-19 ENCOUNTER — Ambulatory Visit (INDEPENDENT_AMBULATORY_CARE_PROVIDER_SITE_OTHER): Payer: Medicare Other | Admitting: Surgery

## 2021-12-19 ENCOUNTER — Encounter: Payer: Self-pay | Admitting: Surgery

## 2021-12-19 VITALS — Ht 65.0 in | Wt 99.4 lb

## 2021-12-19 DIAGNOSIS — M545 Low back pain, unspecified: Secondary | ICD-10-CM

## 2022-01-14 ENCOUNTER — Ambulatory Visit: Payer: Medicare Other | Admitting: Orthopaedic Surgery

## 2022-03-09 NOTE — Progress Notes (Signed)
Office Visit Note   Patient: Cheryl Lawson           Date of Birth: 1951-05-13           MRN: [id number] Visit Date: 12/19/2021              Requested by: Hoyt Koch, MD [address] [place],  [place] [address] PCP: Hoyt Koch, MD   Assessment & Plan: Visit Diagnoses:  1. Acute low back pain without sciatica, unspecified back pain laterality     Plan: Since patient states that she currently does not have any pain we will continue to manage this conservatively.  She can take oral NSAID if pain returns.  She can also let us know and we can always start formal PT.  I do not think this is indicated right now.  Follow-up as needed.  Follow-Up Instructions: No follow-ups on file.   Orders:  No orders of the defined types were placed in this encounter.  No orders of the defined types were placed in this encounter.     Procedures: No procedures performed   Clinical Data: No additional findings.   Subjective: Chief Complaint  Patient presents with   Lower Back - Pain    HPI 71 year old white female who is new patient to the office comes in with previous episode of back pain.  Patient states that a couple weeks ago she had been doing some bending and cleaning and she felt like she had pressure around her low back.  "Not pain but pressure".  No lower extremity radicular symptoms.  She states currently she is not having any pain or issue. Review of Systems No current complaints of cardiopulmonary GI/GU issues  Objective: Vital Signs: Ht '5\' 5"'$  (1.651 m)   Wt 99 lb 6.4 oz (45.1 kg)   BMI 16.54 kg/m   Physical Exam HENT:     Head: Atraumatic.  Pulmonary:     Effort: No respiratory distress.  Musculoskeletal:     Comments: Gait is normal.  No lumbar paraspinal tenderness.  No pain with lumbar flexion or extension.  Negative logroll bilateral hips.  Negative straight leg raise.  No focal motor deficits.  Neurological:     General: No focal deficit  present.     Mental Status: She is alert and oriented to person, place, and time.     Ortho Exam  Specialty Comments:  No specialty comments available.  Imaging: No results found.   PMFS History: Patient Active Problem List   Diagnosis Date Noted   Preop examination 02/29/2020   Routine general medical examination at a health care facility 03/19/2018   [facility] (acromioclavicular) arthritis 09/30/2017   Pre-operative cardiovascular examination 07/11/2015   Nodule of left external ear 05/31/2014   Hashimoto's disease 11/08/2013   Depression with anxiety 07/07/2013   Right thyroid nodule 12/27/2012   Osteoporosis 10/09/2009   ADENOCARCINOMA, BREAST, HX OF 10/06/2007   Past Medical History:  Diagnosis Date   Anxiety    Breast cancer ([place])    Depression     Family History  Problem Relation Age of Onset   Ovarian cancer Mother    Colon cancer Mother     Past Surgical History:  Procedure Laterality Date   BREAST LUMPECTOMY     right   FACIAL COSMETIC SURGERY     infertility surgery     NASAL SEPTUM SURGERY     NECK SURGERY     lift    TONSILLECTOMY  Social History   Occupational History    Employer: [employer]  Tobacco Use   Smoking status: Never   Smokeless tobacco: Never  Vaping Use   Vaping Use: Never used  Substance and Sexual Activity   Alcohol use: Yes    Comment: rare   Drug use: No   Sexual activity: Not on file

## 2022-03-14 ENCOUNTER — Other Ambulatory Visit: Payer: Self-pay | Admitting: Internal Medicine

## 2022-03-19 NOTE — Telephone Encounter (Signed)
Patient is calling in upset that she hasnt heard from anyone. Advised we are waiting on a response from [place].

## 2022-03-20 NOTE — Telephone Encounter (Signed)
Last OV 09/20/20. Please advise

## 2022-03-21 NOTE — Telephone Encounter (Signed)
She will need to schedule visit then can have up to 30 days since last visit >18 months ago

## 2022-03-28 ENCOUNTER — Encounter: Payer: Self-pay | Admitting: Internal Medicine

## 2022-03-28 ENCOUNTER — Ambulatory Visit (INDEPENDENT_AMBULATORY_CARE_PROVIDER_SITE_OTHER): Payer: Medicare Other | Admitting: Internal Medicine

## 2022-03-28 VITALS — BP 104/68 | HR 82 | Temp 97.7°F | Ht 65.0 in | Wt 109.0 lb

## 2022-03-28 DIAGNOSIS — M810 Age-related osteoporosis without current pathological fracture: Secondary | ICD-10-CM | POA: Diagnosis not present

## 2022-03-28 DIAGNOSIS — Z13220 Encounter for screening for lipoid disorders: Secondary | ICD-10-CM

## 2022-03-28 DIAGNOSIS — Z0000 Encounter for general adult medical examination without abnormal findings: Secondary | ICD-10-CM

## 2022-03-28 DIAGNOSIS — Z853 Personal history of malignant neoplasm of breast: Secondary | ICD-10-CM | POA: Diagnosis not present

## 2022-03-28 DIAGNOSIS — Z1211 Encounter for screening for malignant neoplasm of colon: Secondary | ICD-10-CM

## 2022-03-28 DIAGNOSIS — E063 Autoimmune thyroiditis: Secondary | ICD-10-CM | POA: Diagnosis not present

## 2022-03-28 DIAGNOSIS — F418 Other specified anxiety disorders: Secondary | ICD-10-CM | POA: Diagnosis not present

## 2022-03-28 LAB — LIPID PANEL
Cholesterol: 189 mg/dL (ref 0–200)
HDL: 73 mg/dL (ref >39.00)
LDL Cholesterol: 106 mg/dL — ABNORMAL HIGH (ref 0–99)
NonHDL: 115.81
Total CHOL/HDL Ratio: 3
Triglycerides: 48 mg/dL (ref 0.0–149.0)
VLDL: 9.6 mg/dL (ref 0.0–40.0)

## 2022-03-28 LAB — COMPREHENSIVE METABOLIC PANEL
ALT: 18 U/L (ref 0–35)
AST: 35 U/L (ref 0–37)
Albumin: 4.2 g/dL (ref 3.5–5.2)
Alkaline Phosphatase: 62 U/L (ref 39–117)
BUN: 15 mg/dL (ref 6–23)
CO2: 33 mEq/L — ABNORMAL HIGH (ref 19–32)
Calcium: 9.4 mg/dL (ref 8.4–10.5)
Chloride: 103 mEq/L (ref 96–112)
Creatinine, Ser: 0.95 mg/dL (ref 0.40–1.20)
GFR: 61.37 mL/min (ref >60.00)
Glucose, Bld: 72 mg/dL (ref 70–99)
Potassium: 4.8 mEq/L (ref 3.5–5.1)
Sodium: 139 mEq/L (ref 135–145)
Total Bilirubin: 0.4 mg/dL (ref 0.2–1.2)
Total Protein: 6.4 g/dL (ref 6.0–8.3)

## 2022-03-28 LAB — CBC
HCT: 41.7 % (ref 36.0–46.0)
Hemoglobin: 13.7 g/dL (ref 12.0–15.0)
MCHC: 33 g/dL (ref 30.0–36.0)
MCV: 92.3 fl (ref 78.0–100.0)
Platelets: 186 10*3/uL (ref 150.0–400.0)
RBC: 4.52 Mil/uL (ref 3.87–5.11)
RDW: 13.7 % (ref 11.5–15.5)
WBC: 3.5 10*3/uL — ABNORMAL LOW (ref 4.0–10.5)

## 2022-03-28 LAB — TSH: TSH: 2.99 u[IU]/mL (ref 0.35–5.50)

## 2022-03-28 MED ORDER — ALPRAZOLAM 0.25 MG PO TABS: 0.2500 mg | ORAL_TABLET | Freq: Every evening | ORAL | 5 refills | Status: DC | PRN

## 2022-03-28 NOTE — Assessment & Plan Note (Signed)
Refill paxil 10 mg daily and doing okay. Using exercise to help as well. Checking CBC and CMP to screen for effects of meds. Using alprazolam 0.25 mg qhs for sleep. Refilled today. Checked [place] database.

## 2022-03-28 NOTE — Assessment & Plan Note (Signed)
Flu shot declines. Covid-19 counseled. Pneumonia counseled. Shingrix counseled. Tetanus due counseled. Colonoscopy due Dec 2023 and referral done. Mammogram up to date, pap smear aged out and dexa up to date due 2025. Counseled about sun safety and mole surveillance. Counseled about the dangers of distracted driving. Given 10 year screening recommendations.

## 2022-03-28 NOTE — Progress Notes (Signed)
Subjective:   Patient ID: Cheryl Lawson, female    DOB: 11-03-1950, 71 y.o.   MRN: [id number]  HPI Here for medicare wellness, and follow up. Please see A/P for status and treatment of chronic medical problems.   Diet: heart healthy Physical activity: walks 5 days a week Depression/mood screen: negative Hearing: intact to whispered voice Visual acuity: grossly normal, performs annual eye exam  ADLs: capable Fall risk: none Home safety: good Cognitive evaluation: intact to orientation, naming, recall and repetition EOL planning: adv directives discussed  [place] Visit from 03/28/2022 in [place] at [facility]  PHQ-2 Total Score 2       [place] Office Visit from 03/28/2022 in [place] at [facility]  PHQ-9 Total Score 10         10/25/2019    1:34 PM 12/12/2019   12:01 PM 05/16/2020   10:56 AM 03/28/2022    9:40 AM  [place] in the past year? 0  0 0  Was there an injury with Fall? 0   0  Fall Risk Category Calculator 0   0  Fall Risk Category Low   Low  Patient Fall Risk Level Low fall risk Low fall risk Low fall risk Low fall risk  Patient at Risk for Falls Due to    No Fall Risks  Fall risk Follow up   Falls evaluation completed Falls evaluation completed    I have personally reviewed and have noted 1. The patient's medical and social history - reviewed today no changes 2. Their use of alcohol, tobacco or illicit drugs 3. Their current medications and supplements 4. The patient's functional ability including ADL's, fall risks, home safety risks and hearing or visual impairment. 5. Diet and physical activities 6. Evidence for depression or mood disorders 7. Care team reviewed and updated 8.  The patient is not on an opioid pain medication.  Patient Care Team: Hoyt Koch, MD as PCP - General (Internal Medicine) Szabat, Darnelle Maffucci, [facility] (Inactive) as Pharmacist (Pharmacist) Past Medical History:  Diagnosis  Date   Anxiety    Breast cancer [facility])    Depression    Past Surgical History:  Procedure Laterality Date   BREAST LUMPECTOMY     right   FACIAL COSMETIC SURGERY     infertility surgery     NASAL SEPTUM SURGERY     NECK SURGERY     lift    TONSILLECTOMY     Family History  Problem Relation Age of Onset   Ovarian cancer Mother    Colon cancer Mother    Review of Systems  Constitutional: Negative.   HENT: Negative.    Eyes: Negative.   Respiratory:  Negative for cough, chest tightness and shortness of breath.   Cardiovascular:  Negative for chest pain, palpitations and leg swelling.  Gastrointestinal:  Negative for abdominal distention, abdominal pain, constipation, diarrhea, nausea and vomiting.  Musculoskeletal: Negative.   Skin: Negative.   Neurological: Negative.   Psychiatric/Behavioral: Negative.      Objective:  Physical Exam Constitutional:      Appearance: She is well-developed.  HENT:     Head: Normocephalic and atraumatic.  Cardiovascular:     Rate and Rhythm: Normal rate and regular rhythm.  Pulmonary:     Effort: Pulmonary effort is normal. No respiratory distress.     Breath sounds: Normal breath sounds. No wheezing or rales.  Abdominal:     General: Bowel sounds are  normal. There is no distension.     Palpations: Abdomen is soft.     Tenderness: There is no abdominal tenderness. There is no rebound.  Musculoskeletal:     Cervical back: Normal range of motion.  Skin:    General: Skin is warm and dry.  Neurological:     Mental Status: She is alert and oriented to person, place, and time.     Coordination: Coordination normal.     Vitals:   03/28/22 0936  BP: 104/68  Pulse: 82  Temp: 97.7 F (36.5 C)  TempSrc: Oral  SpO2: 97%  Weight: 109 lb (49.4 kg)  Height: '5\' 5"'$  (1.651 m)    Assessment & Plan:

## 2022-03-28 NOTE — Assessment & Plan Note (Signed)
Checking CBC and CMP. Adjust as needed. No falls. Exercising regularly.

## 2022-03-28 NOTE — Patient Instructions (Signed)
We will check the labs and sent in the refill.

## 2022-03-28 NOTE — Assessment & Plan Note (Signed)
Checking TSH and adjust as needed. Not on meds. 

## 2022-03-28 NOTE — Assessment & Plan Note (Signed)
Gets mammogram yearly at [facility]. Will abstract to our epic. Up to date.

## 2022-03-29 ENCOUNTER — Encounter: Payer: Self-pay | Admitting: Internal Medicine

## 2022-05-12 DIAGNOSIS — L218 Other seborrheic dermatitis: Secondary | ICD-10-CM | POA: Diagnosis not present

## 2022-05-12 DIAGNOSIS — L821 Other seborrheic keratosis: Secondary | ICD-10-CM | POA: Diagnosis not present

## 2022-05-12 DIAGNOSIS — Z85828 Personal history of other malignant neoplasm of skin: Secondary | ICD-10-CM | POA: Diagnosis not present

## 2022-05-12 DIAGNOSIS — D225 Melanocytic nevi of trunk: Secondary | ICD-10-CM | POA: Diagnosis not present

## 2022-06-03 IMAGING — DX DG CHEST 2V
2 series · 2 of 2 positions shown · non-contrast
Comparison: 07/02/2016

CLINICAL DATA: Left breast pain for 2 days

EXAM:
CHEST - 2 VIEW

[chest pa]
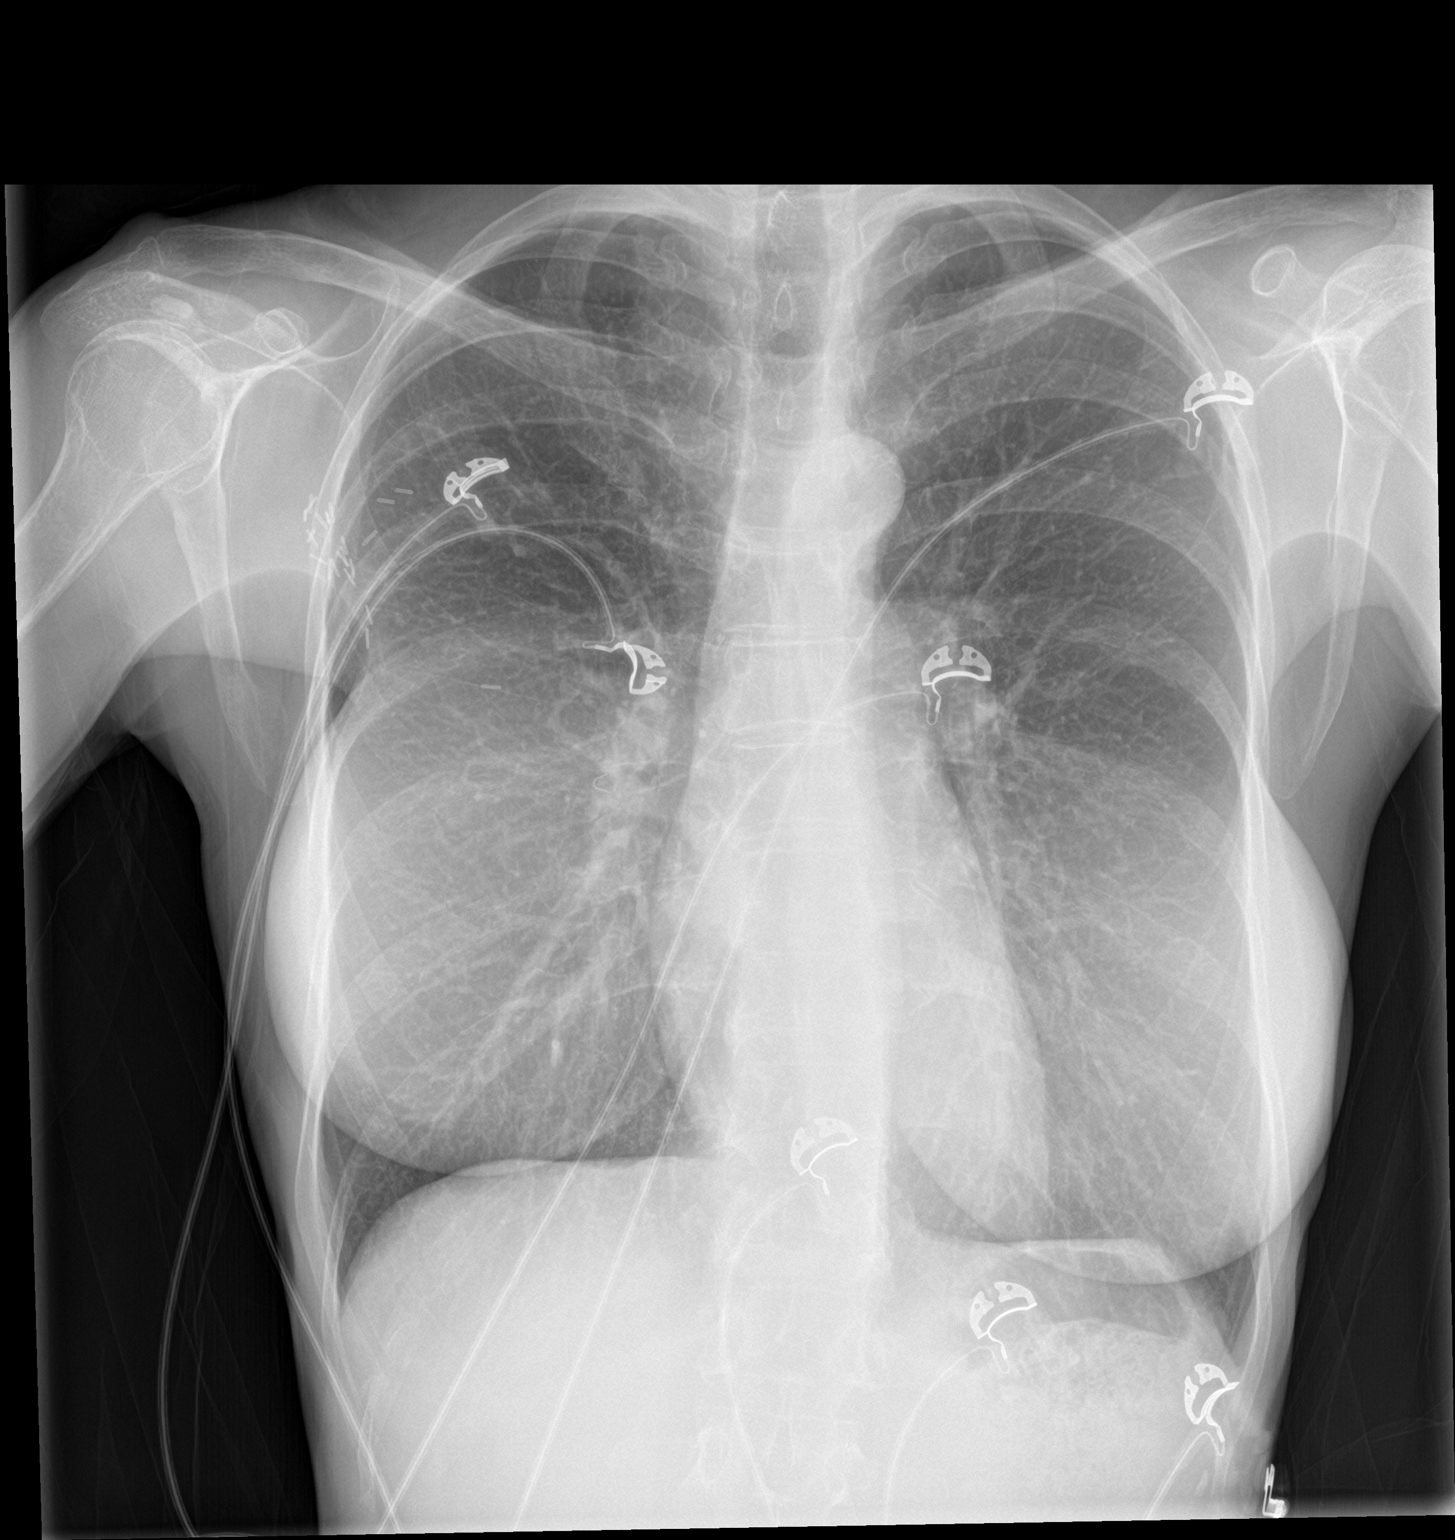

[chest lat]
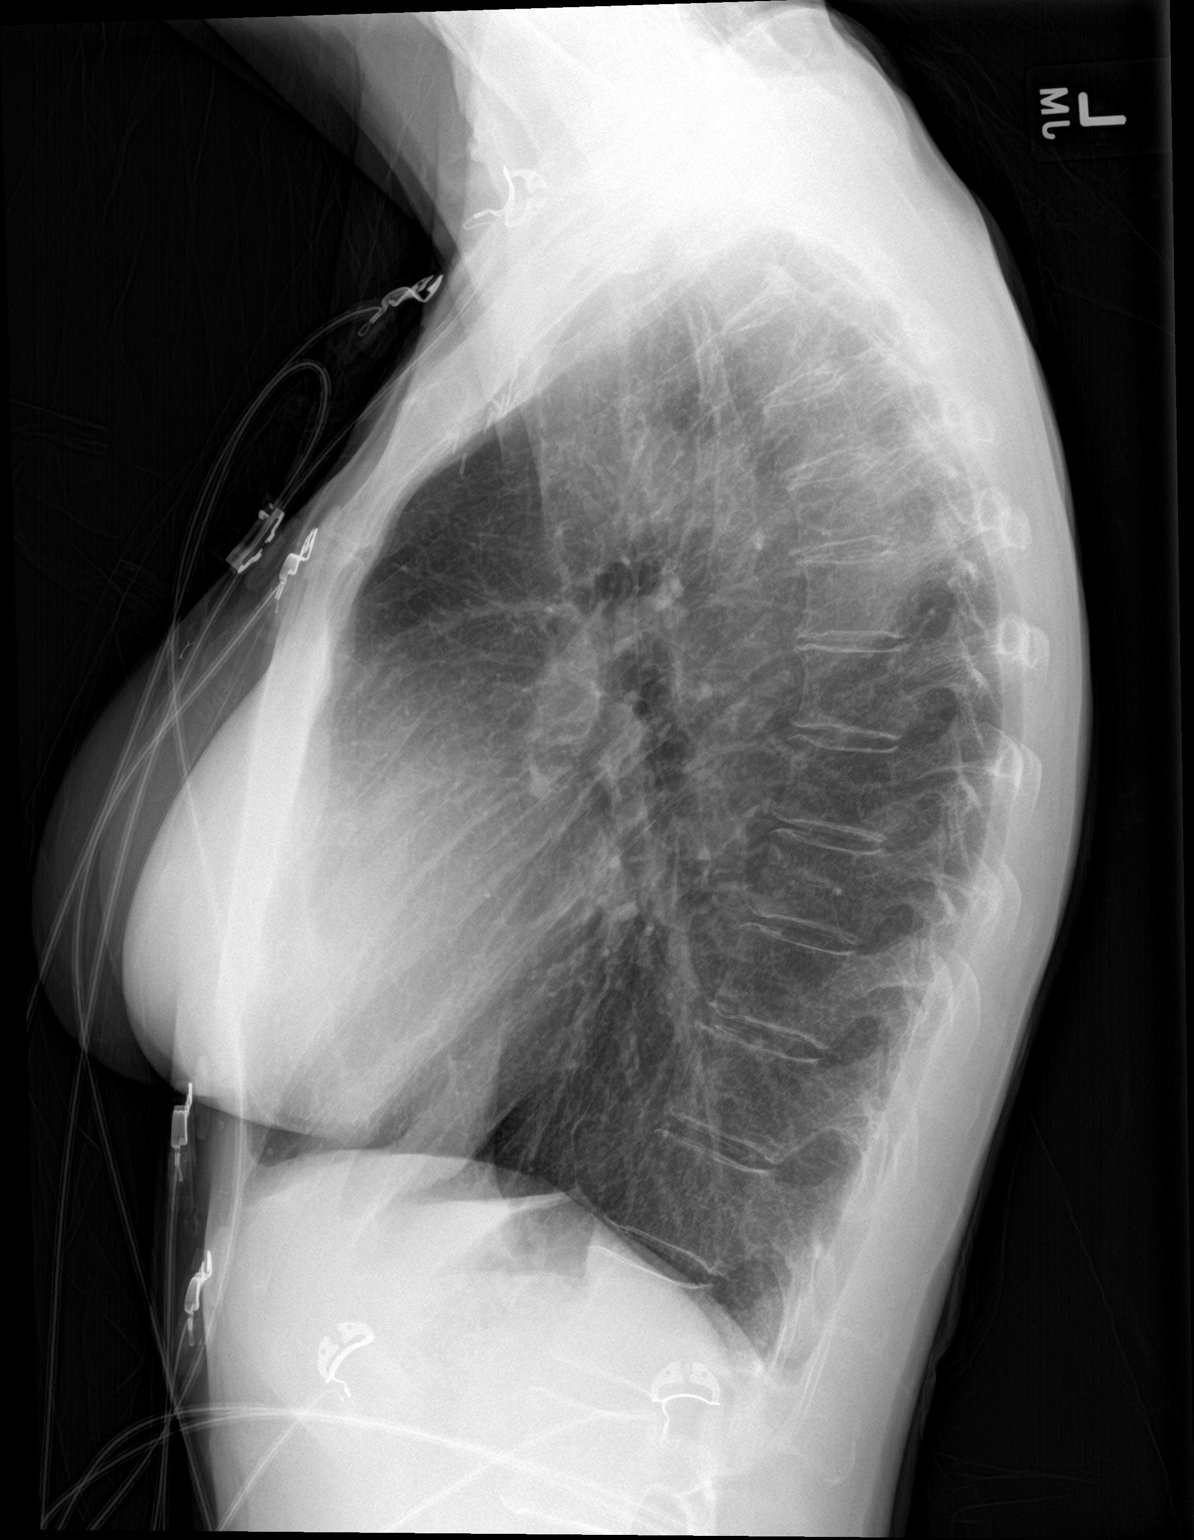

[2 of 2 positions shown; findings below may reference images not displayed]

FINDINGS: The lungs are hyperinflated likely secondary to COPD. There is no
focal consolidation. There is no pleural effusion or pneumothorax.
The heart and mediastinal contours are unremarkable.

There is evidence of prior right axillary dissection.

There is no acute osseous abnormality.
IMPRESSION: No active cardiopulmonary disease.

## 2022-07-06 ENCOUNTER — Other Ambulatory Visit: Payer: Self-pay | Admitting: Internal Medicine

## 2022-08-21 DIAGNOSIS — Z08 Encounter for follow-up examination after completed treatment for malignant neoplasm: Secondary | ICD-10-CM | POA: Diagnosis not present

## 2022-08-21 DIAGNOSIS — Z9229 Personal history of other drug therapy: Secondary | ICD-10-CM | POA: Diagnosis not present

## 2022-08-21 DIAGNOSIS — Z1231 Encounter for screening mammogram for malignant neoplasm of breast: Secondary | ICD-10-CM | POA: Diagnosis not present

## 2022-08-21 DIAGNOSIS — Z23 Encounter for immunization: Secondary | ICD-10-CM | POA: Diagnosis not present

## 2022-08-21 DIAGNOSIS — Z923 Personal history of irradiation: Secondary | ICD-10-CM | POA: Diagnosis not present

## 2022-08-21 DIAGNOSIS — Z9889 Other specified postprocedural states: Secondary | ICD-10-CM | POA: Diagnosis not present

## 2022-08-21 DIAGNOSIS — Z79899 Other long term (current) drug therapy: Secondary | ICD-10-CM | POA: Diagnosis not present

## 2022-08-21 DIAGNOSIS — R92333 Mammographic heterogeneous density, bilateral breasts: Secondary | ICD-10-CM | POA: Diagnosis not present

## 2022-08-21 DIAGNOSIS — Z853 Personal history of malignant neoplasm of breast: Secondary | ICD-10-CM | POA: Diagnosis not present

## 2022-08-27 ENCOUNTER — Encounter: Payer: Self-pay | Admitting: Gastroenterology

## 2022-11-16 ENCOUNTER — Other Ambulatory Visit: Payer: Self-pay | Admitting: Internal Medicine

## 2023-01-12 ENCOUNTER — Encounter: Payer: Self-pay | Admitting: Gastroenterology

## 2023-02-23 ENCOUNTER — Ambulatory Visit (AMBULATORY_SURGERY_CENTER): Payer: Medicare Other

## 2023-02-23 VITALS — Ht 65.0 in | Wt 104.0 lb

## 2023-02-23 DIAGNOSIS — Z1211 Encounter for screening for malignant neoplasm of colon: Secondary | ICD-10-CM

## 2023-02-23 MED ORDER — PEG 3350-KCL-NA BICARB-NACL 420 G PO SOLR: 4000.0000 mL | Freq: Once | ORAL | 0 refills | Status: AC

## 2023-02-23 NOTE — Progress Notes (Signed)
Pre visit completed via phone call; Patient verified name, DOB, and address; No egg or soy allergy known to patient;  No issues known to pt with past sedation with any surgeries or procedures; Patient denies ever being told they had issues or difficulty with intubation;  No FH of Malignant Hyperthermia; Pt is not on diet pills; Pt is not on home 02;  Pt is not on blood thinners;  Pt denies issues with constipation;  No A fib or A flutter; Have any cardiac testing pending--NO Insurance verified during PV appt--- Medicare A/B Pt can ambulate without assistance;  Pt denies use of chewing tobacco Discussed diabetic/weight loss medication holds; Discussed NSAID holds; Checked BMI to be less than 50; Pt instructed to use Singlecare.com or GoodRx for a price reduction on prep; Patient's chart reviewed by Cathlyn Parsons CNRA prior to previsit and patient appropriate for the [facility].  Pre visit completed and red dot placed by patient's name on their procedure day (on provider's schedule).    Instructions printed and mailed to the patient per her request;

## 2023-03-15 ENCOUNTER — Other Ambulatory Visit: Payer: Self-pay | Admitting: Internal Medicine

## 2023-03-17 ENCOUNTER — Encounter: Payer: Medicare Other | Admitting: Gastroenterology

## 2023-03-30 ENCOUNTER — Encounter: Payer: Self-pay | Admitting: Internal Medicine

## 2023-03-30 ENCOUNTER — Ambulatory Visit (INDEPENDENT_AMBULATORY_CARE_PROVIDER_SITE_OTHER): Payer: Medicare Other

## 2023-03-30 ENCOUNTER — Ambulatory Visit (INDEPENDENT_AMBULATORY_CARE_PROVIDER_SITE_OTHER): Payer: Medicare Other | Admitting: Internal Medicine

## 2023-03-30 VITALS — BP 98/70 | HR 70 | Ht 64.25 in | Wt 106.0 lb

## 2023-03-30 VITALS — BP 98/70 | HR 70 | Ht 64.25 in | Wt 106.4 lb

## 2023-03-30 DIAGNOSIS — D72818 Other decreased white blood cell count: Secondary | ICD-10-CM

## 2023-03-30 DIAGNOSIS — E063 Autoimmune thyroiditis: Secondary | ICD-10-CM | POA: Diagnosis not present

## 2023-03-30 DIAGNOSIS — Z853 Personal history of malignant neoplasm of breast: Secondary | ICD-10-CM | POA: Diagnosis not present

## 2023-03-30 DIAGNOSIS — Z1211 Encounter for screening for malignant neoplasm of colon: Secondary | ICD-10-CM

## 2023-03-30 DIAGNOSIS — F418 Other specified anxiety disorders: Secondary | ICD-10-CM | POA: Diagnosis not present

## 2023-03-30 DIAGNOSIS — Z13220 Encounter for screening for lipoid disorders: Secondary | ICD-10-CM | POA: Diagnosis not present

## 2023-03-30 DIAGNOSIS — Z0000 Encounter for general adult medical examination without abnormal findings: Secondary | ICD-10-CM | POA: Diagnosis not present

## 2023-03-30 DIAGNOSIS — M810 Age-related osteoporosis without current pathological fracture: Secondary | ICD-10-CM | POA: Diagnosis not present

## 2023-03-30 DIAGNOSIS — S0034XS External constriction of nose, sequela: Secondary | ICD-10-CM | POA: Diagnosis not present

## 2023-03-30 LAB — LIPID PANEL
Cholesterol: 239 mg/dL — ABNORMAL HIGH (ref 0–200)
HDL: 75 mg/dL (ref >39.00)
LDL Cholesterol: 145 mg/dL — ABNORMAL HIGH (ref 0–99)
NonHDL: 163.71
Total CHOL/HDL Ratio: 3
Triglycerides: 92 mg/dL (ref 0.0–149.0)
VLDL: 18.4 mg/dL (ref 0.0–40.0)

## 2023-03-30 LAB — TSH: TSH: 1.92 u[IU]/mL (ref 0.35–5.50)

## 2023-03-30 LAB — COMPREHENSIVE METABOLIC PANEL
ALT: 18 U/L (ref 0–35)
AST: 36 U/L (ref 0–37)
Albumin: 4.5 g/dL (ref 3.5–5.2)
Alkaline Phosphatase: 60 U/L (ref 39–117)
BUN: 15 mg/dL (ref 6–23)
CO2: 29 meq/L (ref 19–32)
Calcium: 9.8 mg/dL (ref 8.4–10.5)
Chloride: 102 meq/L (ref 96–112)
Creatinine, Ser: 1 mg/dL (ref 0.40–1.20)
GFR: 57.3 mL/min — ABNORMAL LOW (ref >60.00)
Glucose, Bld: 86 mg/dL (ref 70–99)
Potassium: 4.5 meq/L (ref 3.5–5.1)
Sodium: 138 meq/L (ref 135–145)
Total Bilirubin: 0.6 mg/dL (ref 0.2–1.2)
Total Protein: 7 g/dL (ref 6.0–8.3)

## 2023-03-30 LAB — CBC WITH DIFFERENTIAL/PLATELET
Basophils Absolute: 0 10*3/uL (ref 0.0–0.1)
Basophils Relative: 1.1 % (ref 0.0–3.0)
Eosinophils Absolute: 0.1 10*3/uL (ref 0.0–0.7)
Eosinophils Relative: 2.7 % (ref 0.0–5.0)
HCT: 45.7 % (ref 36.0–46.0)
Hemoglobin: 15 g/dL (ref 12.0–15.0)
Lymphocytes Relative: 25.4 % (ref 12.0–46.0)
Lymphs Abs: 1.2 10*3/uL (ref 0.7–4.0)
MCHC: 32.9 g/dL (ref 30.0–36.0)
MCV: 90.9 fL (ref 78.0–100.0)
Monocytes Absolute: 0.5 10*3/uL (ref 0.1–1.0)
Monocytes Relative: 10.4 % (ref 3.0–12.0)
Neutro Abs: 2.8 10*3/uL (ref 1.4–7.7)
Neutrophils Relative %: 60.4 % (ref 43.0–77.0)
Platelets: 203 10*3/uL (ref 150.0–400.0)
RBC: 5.02 Mil/uL (ref 3.87–5.11)
RDW: 13 % (ref 11.5–15.5)
WBC: 4.6 10*3/uL (ref 4.0–10.5)

## 2023-03-30 MED ORDER — ALPRAZOLAM 0.25 MG PO TABS: 0.2500 mg | ORAL_TABLET | Freq: Every evening | ORAL | 3 refills | Status: DC | PRN

## 2023-03-30 MED ORDER — CLOBETASOL PROPIONATE 0.05 % EX SOLN: 1.0000 | Freq: Two times a day (BID) | CUTANEOUS | 3 refills | Status: AC

## 2023-03-30 MED ORDER — PAROXETINE HCL 10 MG PO TABS: 10.0000 mg | ORAL_TABLET | Freq: Every day | ORAL | 3 refills | Status: DC

## 2023-03-30 NOTE — Patient Instructions (Addendum)
We will get you in ear nose and throat to check the nose.  We will check the labs today.

## 2023-03-30 NOTE — Patient Instructions (Signed)
Cheryl Lawson , Thank you for taking time to come for your Medicare Wellness Visit. I appreciate your ongoing commitment to your health goals. Please review the following plan we discussed and let me know if I can assist you in the future.   Referrals/Orders/Follow-Ups/Clinician Recommendations: It was nice meeting you today.  Keep up the good work.    This is a list of the screening recommended for you and due dates:  Health Maintenance  Topic Date Due   DTaP/Tdap/Td vaccine (1 - Tdap) Never done   Zoster (Shingles) Vaccine (1 of 2) Never done   Pneumonia Vaccine (1 of 1 - PCV) Never done   DEXA scan (bone density measurement)  Never done   Colon Cancer Screening  06/02/2022   Flu Shot  Never done   Mammogram  08/16/2023   Medicare Annual Wellness Visit  03/29/2024   Hepatitis C Screening  Completed   HPV Vaccine  Aged Out   COVID-19 Vaccine  Discontinued    Advanced directives: (Provided) Advance directive discussed with you today. I have provided a copy for you to complete at home and have notarized. Once this is complete, please bring a copy in to our office so we can scan it into your chart.   Next Medicare Annual Wellness Visit scheduled for next year: No

## 2023-03-30 NOTE — Progress Notes (Signed)
Subjective:   Cheryl Lawson is a 72 y.o. female who presents for Medicare Annual (Subsequent) preventive examination.  Visit Complete: In person   Cardiac Risk Factors include: advanced age (>67men, >14 women);Other (see comment), Risk factor comments: Hashimoto's disease, Osteoporosis     Objective:    Today's Vitals   03/30/23 0942  BP: 98/70  Pulse: 70  SpO2: 100%  Weight: 106 lb 6.4 oz (48.3 kg)  Height: 5' 4.25" (1.632 m)   Body mass index is 18.12 kg/m.     03/30/2023    9:49 AM 12/12/2019   12:00 PM 07/02/2016    3:18 PM 11/11/2015    7:44 PM 04/25/2015    7:05 PM 08/01/2014    3:24 PM 02/23/2014    5:34 PM  Advanced Directives  Does Patient Have a Medical Advance Directive? No No No No No No No  Would patient like information on creating a medical advance directive? Yes (MAU/Ambulatory/Procedural Areas - Information given)   No - patient declined information No - patient declined information  No - patient declined information    Current Medications (verified) Outpatient Encounter Medications as of 03/30/2023  Medication Sig   ALPRAZolam (XANAX) 0.25 MG tablet TAKE 1 TABLET BY MOUTH AT BEDTIME AS NEEDED FOR ANXIETY   clobetasol (TEMOVATE) 0.05 % external solution Apply 1 application topically 2 (two) times daily.   ibuprofen (ADVIL) 200 MG tablet Take 200 mg by mouth 2 (two) times daily as needed.   Ibuprofen-diphenhydrAMINE HCl (ADVIL PM) 200-25 MG CAPS Take 1 tablet by mouth at bedtime. As needed   minoxidil (ROGAINE) 2 % external solution Apply 1 application topically at bedtime. Put in 3 places   Multiple Vitamin (MULTIVITAMIN ADULT PO) Take 1 tablet by mouth daily at 6 (six) AM. Non formulary vitamins/minerals   Multiple Vitamins-Minerals (ZINC PO) Take 1 tablet by mouth daily at 6 (six) AM.   PARoxetine (PAXIL) 10 MG tablet TAKE 1 TABLET BY MOUTH EVERY DAY   TAURINE PO Take 1 tablet by mouth daily at 6 (six) AM.   Biotin 1000 MCG tablet Take 1,000 mcg by  mouth daily. Bio E emulsion- 4 tabs with each meal (Patient not taking: Reported on 03/30/2023)   Magnesium Gluconate (MAGNESIUM 27 PO) Take 1 tablet by mouth daily at 6 (six) AM. (Patient not taking: Reported on 03/30/2023)   Multiple Vitamin (MULTIVITAMIN WITH MINERALS) TABS tablet Take 1 tablet by mouth daily. (Patient not taking: Reported on 03/30/2023)   No facility-administered encounter medications on file as of 03/30/2023.    Allergies (verified) Patient has no known allergies.   History: Past Medical History:  Diagnosis Date   Anxiety    on meds   Breast cancer (HCC) 1998   Depression    on meds   Past Surgical History:  Procedure Laterality Date   BREAST LUMPECTOMY     right   DILATION AND CURETTAGE OF UTERUS  1986   FACIAL COSMETIC SURGERY     infertility surgery     endometriosis   NASAL SEPTUM SURGERY     NECK SURGERY     lift    TONSILLECTOMY     Family History  Problem Relation Age of Onset   Ovarian cancer Mother    Prostate cancer Father 57   Colon polyps Neg Hx    Crohn's disease Neg Hx    Esophageal cancer Neg Hx    Rectal cancer Neg Hx    Stomach cancer Neg Hx  Social History   Socioeconomic History   Marital status: Married    Spouse name: Onalee Hua   Number of children: 1   Years of education: 121   Highest education level: Not on file  Occupational History    Employer: [employer]   Occupation: Retired  Tobacco Use   Smoking status: Never   Smokeless tobacco: Never  Vaping Use   Vaping status: Never Used  Substance and Sexual Activity   Alcohol use: Yes    Comment: rare   Drug use: No   Sexual activity: Not on file  Other Topics Concern   Not on file  Social History Narrative   HSG. Married - '77. great niece lives with her. 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 no history of physical or sexual abuse.    Social Determinants of Health   Financial Resource Strain: Low Risk  (03/30/2023)    Overall Financial Resource Strain (CARDIA)    Difficulty of Paying Living Expenses: Not hard at all  Food Insecurity: No Food Insecurity (03/30/2023)   Hunger Vital Sign    Worried About Running Out of Food in the Last Year: Never true    Ran Out of Food in the Last Year: Never true  Transportation Needs: No Transportation Needs (03/30/2023)   PRAPARE - Administrator, Civil Service (Medical): No    Lack of Transportation (Non-Medical): No  Physical Activity: Sufficiently Active (03/30/2023)   Exercise Vital Sign    Days of Exercise per Week: 5 days    Minutes of Exercise per Session: 60 min  Stress: No Stress Concern Present (03/30/2023)   [employer] of Occupational Health - Occupational Stress Questionnaire    Feeling of Stress : Not at all  Social Connections: Moderately Integrated (03/30/2023)   Social Connection and Isolation Panel [NHANES]    Frequency of Communication with Friends and Family: Three times a week    Frequency of Social Gatherings with Friends and Family: Once a week    Attends Religious Services: Never    Database administrator or Organizations: Yes    Attends Engineer, structural: 1 to 4 times per year    Marital Status: Married    Tobacco Counseling Counseling given: Not Answered   Clinical Intake:  Pre-visit preparation completed: Yes  Pain : No/denies pain     BMI - recorded: 18.12 Nutritional Status: BMI <19  Underweight Nutritional Risks: None Diabetes: No  How often do you need to have someone help you when you read instructions, pamphlets, or other written materials from your doctor or pharmacy?: 1 - Never  Interpreter Needed?: No  Information entered by :: Lawrie Tunks, RMA   Activities of Daily Living    03/30/2023    9:44 AM  In your present state of health, do you have any difficulty performing the following activities:  Hearing? 0  Vision? 0  Difficulty concentrating or making decisions? 0   Walking or climbing stairs? 0  Dressing or bathing? 0  Doing errands, shopping? 0  Preparing Food and eating ? N  Using the Toilet? N  In the past six months, have you accidently leaked urine? N  Do you have problems with loss of bowel control? N  Managing your Medications? N  Managing your Finances? N  Housekeeping or managing your Housekeeping? N    Patient Care Team: Myrlene Broker, MD as PCP - General (Internal Medicine) Ellin Saba, [facility] (Inactive) as  Pharmacist (Pharmacist) [facility]  Indicate any recent Medical Services you may have received from other than [facility] providers in the past year (date may be approximate).     Assessment:   This is a routine wellness examination for [place].  Hearing/Vision screen Hearing Screening - Comments:: Denies hearing difficulties   Vision Screening - Comments:: Wears eyeglasses when driving.   Goals Addressed               This Visit's Progress     Patient Stated (pt-stated)        Would like to stay consistent with exercise.      Depression Screen    03/30/2023    9:55 AM 03/28/2022    9:40 AM 09/20/2020    2:03 PM 05/16/2020   10:57 AM 08/03/2019    9:38 AM 09/02/2018    9:08 AM  PHQ 2/9 Scores  PHQ - 2 Score 0 2 0 0 0 0  PHQ- 9 Score 1 10 0  0     Fall Risk    03/30/2023    9:50 AM 03/28/2022    9:40 AM 05/16/2020   10:56 AM 10/25/2019    1:34 PM  Fall Risk   Falls in the past year? 0 0 0 0  Number falls in past yr: 0 0 0 0  Injury with Fall? 0 0  0  Risk for fall due to : No Fall Risks No Fall Risks    Follow up Falls prevention discussed;Falls evaluation completed Falls evaluation completed Falls evaluation completed     MEDICARE RISK AT HOME: Medicare Risk at Home Any stairs in or around the home?: Yes If so, are there any without handrails?: Yes Home free of loose throw rugs in walkways, pet beds, electrical cords, etc?: Yes Adequate lighting in your home to reduce risk  of falls?: Yes Life alert?: No Use of a cane, walker or w/c?: No Grab bars in the bathroom?: No Shower chair or bench in shower?: Yes Elevated toilet seat or a handicapped toilet?: Yes  TIMED UP AND GO:  Was the test performed?  Yes  Length of time to ambulate 10 feet: 15 sec Gait steady and fast without use of assistive device    Cognitive Function:        03/30/2023    9:51 AM  6CIT Screen  What Year? 0 points  What month? 0 points  What time? 0 points  Count back from 20 0 points  Months in reverse 0 points  Repeat phrase 0 points  Total Score 0 points    Immunizations Immunization History  Administered Date(s) Administered   PFIZER(Purple Top)SARS-COV-2 Vaccination 08/07/2019, 08/31/2019    TDAP status: Due, Education has been provided regarding the importance of this vaccine. Advised may receive this vaccine at local pharmacy or Health Dept. Aware to provide a copy of the vaccination record if obtained from local pharmacy or Health Dept. Verbalized acceptance and understanding.  Flu Vaccine status: Declined, Education has been provided regarding the importance of this vaccine but patient still declined. Advised may receive this vaccine at local pharmacy or Health Dept. Aware to provide a copy of the vaccination record if obtained from local pharmacy or Health Dept. Verbalized acceptance and understanding.  Pneumococcal vaccine status: Declined,  Education has been provided regarding the importance of this vaccine but patient still declined. Advised may receive this vaccine at local pharmacy or Health Dept. Aware to provide a copy of the vaccination record  if obtained from local pharmacy or Health Dept. Verbalized acceptance and understanding.   Covid-19 vaccine status: Declined, Education has been provided regarding the importance of this vaccine but patient still declined. Advised may receive this vaccine at local pharmacy or Health Dept.or vaccine clinic. Aware to  provide a copy of the vaccination record if obtained from local pharmacy or Health Dept. Verbalized acceptance and understanding.  Qualifies for Shingles Vaccine? Yes   Zostavax completed No   Shingrix Completed?: No.    Education has been provided regarding the importance of this vaccine. Patient has been advised to call insurance company to determine out of pocket expense if they have not yet received this vaccine. Advised may also receive vaccine at local pharmacy or Health Dept. Verbalized acceptance and understanding.  Screening Tests Health Maintenance  Topic Date Due   DTaP/Tdap/Td (1 - Tdap) Never done   Zoster Vaccines- Shingrix (1 of 2) Never done   Pneumonia Vaccine 84+ Years old (1 of 1 - PCV) Never done   DEXA SCAN  Never done   Colonoscopy  06/02/2022   INFLUENZA VACCINE  Never done   MAMMOGRAM  08/16/2023   Medicare Annual Wellness (AWV)  03/29/2024   Hepatitis C Screening  Completed   HPV VACCINES  Aged Out   COVID-19 Vaccine  Discontinued    Health Maintenance  Health Maintenance Due  Topic Date Due   DTaP/Tdap/Td (1 - Tdap) Never done   Zoster Vaccines- Shingrix (1 of 2) Never done   Pneumonia Vaccine 31+ Years old (1 of 1 - PCV) Never done   DEXA SCAN  Never done   Colonoscopy  06/02/2022   INFLUENZA VACCINE  Never done    Colorectal cancer screening: Referral to GI placed 03/28/2022. Pt aware the office will call re: appt.  Mammogram status: Completed 08/2022. Repeat every year   Lung Cancer Screening: (Low Dose CT Chest recommended if Age 82-80 years, 20 pack-year currently smoking OR have quit w/in 15years.) does not qualify.   Lung Cancer Screening Referral: N/A  Additional Screening:  Hepatitis C Screening: does qualify; Completed 07/10/2015  Vision Screening: Recommended annual ophthalmology exams for early detection of glaucoma and other disorders of the eye. Is the patient up to date with their annual eye exam?  Yes  Who is the provider or  what is the name of the office in which the patient attends annual eye exams? Dr. Dione Booze If pt is not established with a provider, would they like to be referred to a provider to establish care? No .   Dental Screening: Recommended annual dental exams for proper oral hygiene   Community Resource Referral / Chronic Care Management: CRR required this visit?  No   CCM required this visit?  No     Plan:     I have personally reviewed and noted the following in the patient's chart:   Medical and social history Use of alcohol, tobacco or illicit drugs  Current medications and supplements including opioid prescriptions. Patient is not currently taking opioid prescriptions. Functional ability and status Nutritional status Physical activity Advanced directives List of other physicians Hospitalizations, surgeries, and ER visits in previous 12 months Vitals Screenings to include cognitive, depression, and falls Referrals and appointments  In addition, I have reviewed and discussed with patient certain preventive protocols, quality metrics, and best practice recommendations. A written personalized care plan for preventive services as well as general preventive health recommendations were provided to patient.     Tacey Dimaggio Lawson  Cheryl Lawson, CMA   03/30/2023   After Visit Summary: (MyChart) Due to this being a telephonic visit, the after visit summary with patients personalized plan was offered to patient via MyChart   Nurse Notes: Patient declines all vaccines at this time.  She is due for a DEXA, however she declines that as well.  Patient states that she has had one a long time ago with [facility].   Patient would like to wait until the new year to schedule her next year's AWV.  She has no other concerns to address today.

## 2023-03-30 NOTE — Progress Notes (Unsigned)
Subjective:   Patient ID: Cheryl Lawson, female    DOB: 1951/04/29, 72 y.o.   MRN: [id number]  HPI The patient is a 72 YO female coming in for follow up.  Review of Systems  Objective:  Physical Exam  Vitals:   03/30/23 1032  BP: 98/70  Pulse: 70  TempSrc: Oral  SpO2: 100%  Weight: 106 lb (48.1 kg)  Height: 5' 4.25" (1.632 m)    Assessment & Plan:

## 2023-03-31 ENCOUNTER — Encounter: Payer: Self-pay | Admitting: Internal Medicine

## 2023-03-31 DIAGNOSIS — D72819 Decreased white blood cell count, unspecified: Secondary | ICD-10-CM | POA: Insufficient documentation

## 2023-03-31 DIAGNOSIS — S0034XA External constriction of nose, initial encounter: Secondary | ICD-10-CM | POA: Insufficient documentation

## 2023-03-31 NOTE — Assessment & Plan Note (Signed)
Checking CMP and monitoring. No new falls or fractures. DEXA due and she declines today.

## 2023-03-31 NOTE — Assessment & Plan Note (Signed)
Taking paxil 10 mg daily and will continue. Uses xanax 0.25 mg daily prn and refilled today.

## 2023-03-31 NOTE — Assessment & Plan Note (Signed)
Checking TSH and adjust as needed not on medication currently.

## 2023-03-31 NOTE — Assessment & Plan Note (Signed)
Checking CBC with diff and adjust as needed.

## 2023-03-31 NOTE — Assessment & Plan Note (Signed)
Continue yearly mammograms. No new clinical symptoms or areas of concerns.

## 2023-03-31 NOTE — Assessment & Plan Note (Signed)
Refer to ENT for consideration of procedure. She does have problems with drainage and airflow through the right nostril which has slightly worsened with time.

## 2023-04-11 DIAGNOSIS — Z1211 Encounter for screening for malignant neoplasm of colon: Secondary | ICD-10-CM | POA: Diagnosis not present

## 2023-04-19 LAB — COLOGUARD: COLOGUARD: NEGATIVE

## 2023-04-20 ENCOUNTER — Institutional Professional Consult (permissible substitution) (INDEPENDENT_AMBULATORY_CARE_PROVIDER_SITE_OTHER): Payer: Medicare Other

## 2023-04-20 ENCOUNTER — Encounter: Payer: Self-pay | Admitting: Internal Medicine

## 2023-04-20 LAB — COLOGUARD: Cologuard: NEGATIVE

## 2023-04-28 ENCOUNTER — Institutional Professional Consult (permissible substitution) (INDEPENDENT_AMBULATORY_CARE_PROVIDER_SITE_OTHER): Payer: Medicare Other

## 2023-08-06 ENCOUNTER — Emergency Department (HOSPITAL_BASED_OUTPATIENT_CLINIC_OR_DEPARTMENT_OTHER): Payer: Medicare Other

## 2023-08-06 ENCOUNTER — Other Ambulatory Visit: Payer: Self-pay

## 2023-08-06 ENCOUNTER — Encounter (HOSPITAL_BASED_OUTPATIENT_CLINIC_OR_DEPARTMENT_OTHER): Payer: Self-pay

## 2023-08-06 ENCOUNTER — Emergency Department (HOSPITAL_BASED_OUTPATIENT_CLINIC_OR_DEPARTMENT_OTHER)
Admission: EM | Admit: 2023-08-06 | Discharge: 2023-08-06 | Disposition: A | Discharge status: Home / Self Care | Payer: Medicare Other | Attending: Emergency Medicine | Admitting: Emergency Medicine

## 2023-08-06 DIAGNOSIS — L089 Local infection of the skin and subcutaneous tissue, unspecified: Secondary | ICD-10-CM | POA: Diagnosis not present

## 2023-08-06 DIAGNOSIS — M799 Soft tissue disorder, unspecified: Secondary | ICD-10-CM | POA: Diagnosis not present

## 2023-08-06 DIAGNOSIS — R531 Weakness: Secondary | ICD-10-CM | POA: Diagnosis not present

## 2023-08-06 DIAGNOSIS — R220 Localized swelling, mass and lump, head: Secondary | ICD-10-CM | POA: Diagnosis present

## 2023-08-06 LAB — BASIC METABOLIC PANEL
Anion gap: 8 (ref 5–15)
BUN: 19 mg/dL (ref 8–23)
CO2: 29 mmol/L (ref 22–32)
Calcium: 9.3 mg/dL (ref 8.9–10.3)
Chloride: 100 mmol/L (ref 98–111)
Creatinine, Ser: 1.26 mg/dL — ABNORMAL HIGH (ref 0.44–1.00)
GFR, Estimated: 46 mL/min — ABNORMAL LOW (ref >60)
Glucose, Bld: 83 mg/dL (ref 70–99)
Potassium: 4.5 mmol/L (ref 3.5–5.1)
Sodium: 137 mmol/L (ref 135–145)

## 2023-08-06 LAB — CBC WITH DIFFERENTIAL/PLATELET
Abs Immature Granulocytes: 0.02 10*3/uL (ref 0.00–0.07)
Basophils Absolute: 0.1 10*3/uL (ref 0.0–0.1)
Basophils Relative: 1 %
Eosinophils Absolute: 0.2 10*3/uL (ref 0.0–0.5)
Eosinophils Relative: 3 %
HCT: 45.8 % (ref 36.0–46.0)
Hemoglobin: 15.3 g/dL — ABNORMAL HIGH (ref 12.0–15.0)
Immature Granulocytes: 0 %
Lymphocytes Relative: 21 %
Lymphs Abs: 1.2 10*3/uL (ref 0.7–4.0)
MCH: 30.3 pg (ref 26.0–34.0)
MCHC: 33.4 g/dL (ref 30.0–36.0)
MCV: 90.7 fL (ref 80.0–100.0)
Monocytes Absolute: 0.6 10*3/uL (ref 0.1–1.0)
Monocytes Relative: 10 %
Neutro Abs: 3.8 10*3/uL (ref 1.7–7.7)
Neutrophils Relative %: 65 %
Platelets: 207 10*3/uL (ref 150–400)
RBC: 5.05 MIL/uL (ref 3.87–5.11)
RDW: 12.9 % (ref 11.5–15.5)
WBC: 5.9 10*3/uL (ref 4.0–10.5)
nRBC: 0 % (ref 0.0–0.2)

## 2023-08-06 MED ORDER — KETOROLAC TROMETHAMINE 30 MG/ML IJ SOLN
15.0000 mg | Freq: Once | INTRAMUSCULAR | Status: AC
  Administered 2023-08-06: 15 mg via INTRAVENOUS
  Filled 2023-08-06: qty 1

## 2023-08-06 MED ORDER — AMOXICILLIN-POT CLAVULANATE 875-125 MG PO TABS
1.0000 | ORAL_TABLET | Freq: Once | ORAL | Status: AC
  Administered 2023-08-06: 1 via ORAL
  Filled 2023-08-06: qty 1

## 2023-08-06 MED ORDER — AMOXICILLIN-POT CLAVULANATE 875-125 MG PO TABS: 1.0000 | ORAL_TABLET | Freq: Two times a day (BID) | ORAL | 0 refills | Status: DC

## 2023-08-06 MED ORDER — IOHEXOL 300 MG/ML  SOLN
75.0000 mL | Freq: Once | INTRAMUSCULAR | Status: AC | PRN
  Administered 2023-08-06: 75 mL via INTRAVENOUS

## 2023-08-06 NOTE — Discharge Instructions (Signed)
 Take Augmentin as prescribed for the infection of the left cheek implant. This will need to be rechecked as we discussed. You can see your primary care provider for this to ensure improvement. You may be referred back to your plastic surgeon as recommended by your doctor.

## 2023-08-06 NOTE — ED Notes (Signed)
 Assumed care of patient at this time. Report received from [place], [place].

## 2023-08-06 NOTE — ED Triage Notes (Signed)
 In for eval of facial swelling  and pain about 3 weeks ago. Was seen by Dr in [place] and instructed to go to PCP and to have a CT scan. PMH cheek implants 1995 and surgery to cheek muscles approx 3 years ago.

## 2023-08-06 NOTE — ED Provider Notes (Signed)
 [facility] Provider Note   CSN: [id number] Arrival date & time: 08/06/23  1448     History  Chief Complaint  Patient presents with   Facial Swelling    Cheryl Lawson is a 73 y.o. female.  Left facial swelling that started about 3 weeks ago and has gotten progressively worse. No fever or redness. No injury. She denies dental pain. History of cheek implants remotely and revision 2 years ago in [place]. She was seen by the surgeon who performed the revision this week who reported there was "nothing more for him to do". No fever. She reports intermittent discomfort. No ear pain, sore throat. She denies pain with eye movement but states she feels some pressure now when moving the eye.   The history is provided by the patient. No language interpreter was used.       Home Medications Prior to Admission medications   Medication Sig Start Date End Date Taking? Authorizing Provider  amoxicillin-clavulanate (AUGMENTIN) 875-125 MG tablet Take 1 tablet by mouth every 12 (twelve) hours. 08/06/23  Yes Elpidio Anis, PA-C  ALPRAZolam Prudy Feeler) 0.25 MG tablet Take 1 tablet (0.25 mg total) by mouth at bedtime as needed for anxiety. 03/30/23   Myrlene Broker, MD  clobetasol (TEMOVATE) 0.05 % external solution Apply 1 Application topically 2 (two) times daily. 03/30/23   Myrlene Broker, MD  ibuprofen (ADVIL) 200 MG tablet Take 200 mg by mouth 2 (two) times daily as needed.    [provider]  Ibuprofen-diphenhydrAMINE HCl (ADVIL PM) 200-25 MG CAPS Take 1 tablet by mouth at bedtime. As needed    [provider]  minoxidil (ROGAINE) 2 % external solution Apply 1 application topically at bedtime. Put in 3 places    [provider]  Multiple Vitamin (MULTIVITAMIN ADULT PO) Take 1 tablet by mouth daily at 6 (six) AM. Non formulary vitamins/minerals    [provider]  Multiple Vitamins-Minerals (ZINC PO) Take 1  tablet by mouth daily at 6 (six) AM.    [provider]  PARoxetine (PAXIL) 10 MG tablet Take 1 tablet (10 mg total) by mouth daily. 03/30/23   Myrlene Broker, MD  TAURINE PO Take 1 tablet by mouth daily at 6 (six) AM.    [provider]      Allergies    Patient has no known allergies.    Review of Systems   Review of Systems  Physical Exam Updated Vital Signs BP (!) 127/91 (BP Location: Left Arm)   Pulse 88   Temp 97.9 F (36.6 C) (Oral)   Resp 16   Ht 5' 4.5" (1.638 m)   Wt 49.4 kg   SpO2 100%   BMI 18.42 kg/m  Physical Exam Constitutional:      General: She is not in acute distress.    Appearance: She is well-developed. She is not ill-appearing.  HENT:     Head:     Comments: Left cheek swollen, firm, no fluctuance. No redness. Left ear: TM clear, no canal swelling. Dentition: no significant decay or intraoral swelling.  Pulmonary:     Effort: Pulmonary effort is normal.  Musculoskeletal:        General: Normal range of motion.     Cervical back: Normal range of motion.  Skin:    General: Skin is warm and dry.  Neurological:     Mental Status: She is alert and oriented to person, place, and time.  ED Results / Procedures / Treatments   Labs (all labs ordered are listed, but only abnormal results are displayed) Labs Reviewed  CBC WITH DIFFERENTIAL/PLATELET - Abnormal; Notable for the following components:      Result Value   Hemoglobin 15.3 (*)    All other components within normal limits  BASIC METABOLIC PANEL - Abnormal; Notable for the following components:   Creatinine, Ser 1.26 (*)    GFR, Estimated 46 (*)    All other components within normal limits    EKG None  Radiology CT Maxillofacial W Contrast Result Date: 08/06/2023 CLINICAL DATA:  Facial paralysis and weakness EXAM: CT MAXILLOFACIAL WITH CONTRAST TECHNIQUE: Multidetector CT imaging of the maxillofacial structures was performed with intravenous contrast.  Multiplanar CT image reconstructions were also generated. RADIATION DOSE REDUCTION: This exam was performed according to the departmental dose-optimization program which includes automated exposure control, adjustment of the mA and/or kV according to patient size and/or use of iterative reconstruction technique. CONTRAST:  75mL OMNIPAQUE IOHEXOL 300 MG/ML  SOLN COMPARISON:  None Available. FINDINGS: Osseous: No fracture or mandibular dislocation. No destructive process. Orbits: Negative. No traumatic or inflammatory finding. Sinuses: Clear. Soft tissues: There are bilateral cheek implants. On the left, there is asymmetric soft tissue density material deep to the implant and inflammatory change within the overlying subcutaneous tissues, extending to the left nasolabial fold. There is no drainable fluid collection. Limited intracranial: Normal IMPRESSION: Asymmetric soft tissue density material deep to the left cheek implant and inflammatory change within the overlying subcutaneous tissues, extending to the left nasolabial fold. This may indicate implant infection. No drainable fluid collection. Electronically Signed   By: Deatra Robinson M.D.   On: 08/06/2023 20:49    Procedures Procedures    Medications Ordered in ED Medications  ketorolac (TORADOL) 30 MG/ML injection 15 mg (15 mg Intravenous Given 08/06/23 1853)  iohexol (OMNIPAQUE) 300 MG/ML solution 75 mL (75 mLs Intravenous Contrast Given 08/06/23 1902)  amoxicillin-clavulanate (AUGMENTIN) 875-125 MG per tablet 1 tablet (1 tablet Oral Given 08/06/23 2121)    ED Course/ Medical Decision Making/ A&P Clinical Course as of 08/06/23 2133  Thu Aug 06, 2023  1716 Facial swelling and hx of implants. [CC]  1726 Patient with facial swelling, history of implant (1995) with revision 2 years ago. Symptoms started 3 weeks ago and have become progressively worse. No fever. Labs pending. She will need a CT maxillo to r/o abscess, deep space infection.  [SU]  2129  CT maxillo:   IMPRESSION: Asymmetric soft tissue density material deep to the left cheek implant and inflammatory change within the overlying subcutaneous tissues, extending to the left nasolabial fold. This may indicate implant infection. No drainable fluid collection.  Will start Augmentin. She will need close follow up. She will see her PCP, return to plastics (in [place]) if needed.  [SU]    Clinical Course User Index [CC] Glyn Ade, MD [SU] Elpidio Anis, PA-C                                 Medical Decision Making Amount and/or Complexity of Data Reviewed Labs: ordered. Radiology: ordered.  Risk Prescription drug management.           Final Clinical Impression(s) / ED Diagnoses Final diagnoses:  Facial infection    Rx / DC Orders ED Discharge Orders          Ordered    amoxicillin-clavulanate (AUGMENTIN) 875-125  MG tablet  Every 12 hours        08/06/23 2133              Elpidio Anis, Cordelia Poche 08/06/23 2133    Glyn Ade, MD 08/06/23 2236

## 2023-08-07 ENCOUNTER — Ambulatory Visit: Payer: Medicare Other | Admitting: Internal Medicine

## 2023-08-10 ENCOUNTER — Encounter: Payer: Self-pay | Admitting: Internal Medicine

## 2023-08-10 ENCOUNTER — Ambulatory Visit: Payer: Self-pay | Admitting: Internal Medicine

## 2023-08-10 NOTE — Telephone Encounter (Signed)
 Copied From CRM [id number]. Reason for Triage: Patient seen at the hospital for a facial infection on 2/20, was told to follow up within 4-5 days. No availability until 3/5, pt then states she is not responding well to the amoxicillin and is still feeling pain. Patient states her face is still swollen and she's not sure if she's being inpatient.  Chief Complaint: Facial pain and swelling Symptoms: Facial pain and swelling Frequency: 4-5 days Pertinent Negatives: Patient denies fever Disposition: [] ED /[] Urgent Care (no appt availability in office) / [] Appointment(In office/virtual)/ []  [facility] [facility]/ [x] Home Care/ [] Refused Recommended Disposition /[]  Mobile Bus/ []  Follow-up with PCP Additional Notes: Patient called in to report facial pain and swelling. Patient was seen in the ED on 08/06/23 and treated for a facial infection. Patient has been taking amoxicillin bid for 4-5 days. Patient was prescribed a 7 day supply. Patient stated the symptoms are improving. Patient denied fever. Patient stated she is nervous because the symptoms have still not resolved. This RN advised patient that antibiotics work over the course of several days and it is a good sign that her symptoms are improving. This RN advised patient to call back if symptoms are still present when she finishes the antibiotic. This RN advised patient to call back if symptoms worsen or if a fever starts. This RN attempted to schedule a HFU, but there was no availability with PCP within 14 days of ED visit. Patient would like to see PCP and not another provider. This RN advised that I would route this conversation to clinic, for their discretion on scheduling.   Reason for Disposition  [1] Taking antibiotic AND [2] symptoms are BETTER AND [3] no fever  Answer Assessment - Initial Assessment Questions 1. INFECTION: "What infection is the antibiotic being given for?"     Left cheek, seen in ED on 08/06/23 2. ANTIBIOTIC: "What  antibiotic are you taking" "How many times per day?"     Amoxicillin q12h 3. DURATION: "When was the antibiotic started?"     Started on 08/06/23 in ED 4. MAIN CONCERN OR SYMPTOM:  "What is your main concern right now?"     Pain and swelling is still present, but has improved  5. BETTER-SAME-WORSE: "Are you getting better, staying the same, or getting worse compared to when you first started the antibiotics?" If getting worse, ask: "In what way?"      Pain and swelling is still present, but has improved  6. FEVER: "Do you have a fever?" If Yes, ask: "What is your temperature, how was it measured, and when did it start?"     Denies at this time 7. SYMPTOMS: "Are there any other symptoms you're concerned about?" If Yes, ask: "When did it start?"     Denies 8. FOLLOW-UP APPOINTMENT: "Do you have a follow-up appointment with your doctor?"     Yes, scheduled for 08/19/23, but states she is nervous to wait that long  Protocols used: Infection on Antibiotic Follow-up Call-A-AH

## 2023-08-11 NOTE — Telephone Encounter (Signed)
 Patient Is requesting got be seen with you and no one else your next availability is not until march 5th

## 2023-08-11 NOTE — Telephone Encounter (Signed)
 I see availability on my schedule tomorrow so I am uncertain why this message is to me and not just scheduling her.

## 2023-08-13 ENCOUNTER — Ambulatory Visit: Payer: Self-pay | Admitting: Internal Medicine

## 2023-08-13 NOTE — Telephone Encounter (Signed)
 Copied from CRM [id number]. Topic: Clinical - Red Word Triage >> Aug 13, 2023 10:08 AM Kathryne Eriksson wrote: Red Word that prompted transfer to Nurse Triage: Infection In Left Cheek >> Aug 13, 2023 10:10 AM Kathryne Eriksson wrote: Patient states she has an infection in her left cheek, patient states along the side of her cheek it looks green. Patient states she was given antibiotics but doesn't feel as though it's helping. Patient also mentioned she has implants in her cheeks that may be the cause of her infections.    Chief Complaint: Cheek infection  Symptoms: Swelling of left cheek Frequency: Constant  Pertinent Negatives: Patient denies fever Disposition: [] ED /[] Urgent Care (no appt availability in office) / [x] Appointment(In office/virtual)/ []  [facility]/ [] Home Care/ [] Refused Recommended Disposition /[]  Mobile Bus/ []  Follow-up with PCP Additional Notes: Patient was recently seen in the ED for facial swelling and was given an antibiotic for probably cheek implant infection. She states that she is taking her last dose tonight and is still experiencing swelling of her left cheek. She states that the skin over the swelling also appears to be discolored. She reports the swelling is improved but is concerned because it is not fully resolved. Patient has a follow-up appointment already scheduled for tomorrow. I advised the patient that during her appointment tomorrow they will evaluate her and see if she needs any additional treatment. Patient verbalized understanding iand is agreeable with this plan.      Reason for Disposition  [1] Finished taking antibiotics AND [2] symptoms are BETTER but [3] not completely gone  Answer Assessment - Initial Assessment Questions 1. INFECTION: "What infection is the antibiotic being given for?"     Cheek infection  2. ANTIBIOTIC: "What antibiotic are you taking" "How many times per day?"     Augmentin  3. DURATION: "When was the antibiotic  started?"     08/06/23 4. MAIN CONCERN OR SYMPTOM:  "What is your main concern right now?"     Left cheek swelling  5. BETTER-SAME-WORSE: "Are you getting better, staying the same, or getting worse compared to when you first started the antibiotics?" If getting worse, ask: "In what way?"      Better but still present  6. FEVER: "Do you have a fever?" If Yes, ask: "What is your temperature, how was it measured, and when did it start?"     No 7. SYMPTOMS: "Are there any other symptoms you're concerned about?" If Yes, ask: "When did it start?"     No 8. FOLLOW-UP APPOINTMENT: "Do you have a follow-up appointment with your doctor?"     No  Protocols used: Infection on Antibiotic Follow-up Call-A-AH

## 2023-08-14 ENCOUNTER — Encounter: Payer: Self-pay | Admitting: Family Medicine

## 2023-08-14 ENCOUNTER — Ambulatory Visit (INDEPENDENT_AMBULATORY_CARE_PROVIDER_SITE_OTHER): Payer: Medicare Other | Admitting: Family Medicine

## 2023-08-14 VITALS — BP 116/80 | HR 84 | Temp 98.5°F | Ht 64.5 in | Wt 107.0 lb

## 2023-08-14 DIAGNOSIS — T8579XA Infection and inflammatory reaction due to other internal prosthetic devices, implants and grafts, initial encounter: Secondary | ICD-10-CM | POA: Diagnosis not present

## 2023-08-14 DIAGNOSIS — R519 Headache, unspecified: Secondary | ICD-10-CM

## 2023-08-14 DIAGNOSIS — R220 Localized swelling, mass and lump, head: Secondary | ICD-10-CM | POA: Diagnosis not present

## 2023-08-14 MED ORDER — CIPROFLOXACIN HCL 500 MG PO TABS: 500.0000 mg | ORAL_TABLET | Freq: Two times a day (BID) | ORAL | 0 refills | Status: AC

## 2023-08-14 NOTE — Progress Notes (Signed)
 Acute Office Visit  Subjective:     Patient ID: Cheryl Lawson, female    DOB: [birth year], 73 y.o.   MRN: [id number]  Chief Complaint  Patient presents with   Hospitalization Follow-up    Seen in ED for facial infection, started about 3 weeks ago. Left side face swollen, has cheek implants. Had work on face in 1996, and again 3 years ago    HPI Patient is in today for evaluation of left cheek pain and swelling. Hx surgical implants. Was seen at [facility] in the ER and was treated with Augmentin starting on 08/07/23. Last dose was today, no real progress  ER note and CT report reviewed  by me.  Suspect implant infection per CT Does not have current plastic surgeon Concerned that she may need implants removed. Denies fever, chills, abdominal pain, nausea, vomiting, diarrhea, rash, other symptoms.  ROS Per HPI      Objective:    BP 116/80 (BP Location: Left Arm, Patient Position: Sitting)   Pulse 84   Temp 98.5 F (36.9 C) (Temporal)   Ht 5' 4.5" (1.638 m)   Wt 107 lb (48.5 kg)   SpO2 98%   BMI 18.08 kg/m    Physical Exam Vitals and nursing note reviewed.  Constitutional:      General: She is not in acute distress.    Appearance: Normal appearance. She is normal weight.  HENT:     Head:      Comments: Area of swelling, mild tenderness. Mild discoloration to the area. No discharge noted    Nose: Nose normal.  Eyes:     Extraocular Movements: Extraocular movements intact.     Pupils: Pupils are equal, round, and reactive to light.  Cardiovascular:     Rate and Rhythm: Normal rate and regular rhythm.     Heart sounds: Normal heart sounds.  Pulmonary:     Effort: Pulmonary effort is normal. No respiratory distress.     Breath sounds: Normal breath sounds. No wheezing, rhonchi or rales.  Musculoskeletal:        General: Normal range of motion.     Cervical back: Normal range of motion.  Lymphadenopathy:     Cervical: No cervical adenopathy.  Neurological:      General: No focal deficit present.     Mental Status: She is alert and oriented to person, place, and time.  Psychiatric:        Mood and Affect: Mood normal.        Thought Content: Thought content normal.     No results found for any visits on 08/14/23.      Assessment & Plan:  1. Infection of prosthesis, initial encounter (HCC) (Primary)  - ciprofloxacin (CIPRO) 500 MG tablet; Take 1 tablet (500 mg total) by mouth 2 (two) times daily for 7 days.  Dispense: 14 tablet; Refill: 0 - Ambulatory referral to Infectious Disease  2. Left facial swelling  - ciprofloxacin (CIPRO) 500 MG tablet; Take 1 tablet (500 mg total) by mouth 2 (two) times daily for 7 days.  Dispense: 14 tablet; Refill: 0 - Ambulatory referral to Infectious Disease  3. Left facial pain  - ciprofloxacin (CIPRO) 500 MG tablet; Take 1 tablet (500 mg total) by mouth 2 (two) times daily for 7 days.  Dispense: 14 tablet; Refill: 0 - Ambulatory referral to Infectious Disease   Meds ordered this encounter  Medications   ciprofloxacin (CIPRO) 500 MG tablet    Sig: Take  1 tablet (500 mg total) by mouth 2 (two) times daily for 7 days.    Dispense:  14 tablet    Refill:  0    Return if symptoms worsen or fail to improve.  Moshe Cipro, FNP

## 2023-08-14 NOTE — Patient Instructions (Signed)
 I have sent in a prescription for you to take Cipro 500mg  twice a day for 7 days.   Eat with this medication, it can upset your stomach if you do not.   I have sent in a referral to infectious disease today.  We may also have to get you in for a plastic surgery evaluation as well.   Follow-up with me for new or worsening symptoms.

## 2023-08-19 ENCOUNTER — Encounter: Payer: Self-pay | Admitting: Family Medicine

## 2023-08-21 ENCOUNTER — Other Ambulatory Visit: Payer: Self-pay | Admitting: Family Medicine

## 2023-08-21 DIAGNOSIS — R220 Localized swelling, mass and lump, head: Secondary | ICD-10-CM

## 2023-08-21 DIAGNOSIS — R519 Headache, unspecified: Secondary | ICD-10-CM

## 2023-08-21 DIAGNOSIS — T8579XA Infection and inflammatory reaction due to other internal prosthetic devices, implants and grafts, initial encounter: Secondary | ICD-10-CM

## 2023-08-22 ENCOUNTER — Encounter: Payer: Self-pay | Admitting: Family Medicine

## 2023-08-27 DIAGNOSIS — Z1231 Encounter for screening mammogram for malignant neoplasm of breast: Secondary | ICD-10-CM | POA: Diagnosis not present

## 2023-08-27 DIAGNOSIS — Z923 Personal history of irradiation: Secondary | ICD-10-CM | POA: Diagnosis not present

## 2023-08-27 DIAGNOSIS — Z9889 Other specified postprocedural states: Secondary | ICD-10-CM | POA: Diagnosis not present

## 2023-08-27 DIAGNOSIS — Z9229 Personal history of other drug therapy: Secondary | ICD-10-CM | POA: Diagnosis not present

## 2023-08-27 DIAGNOSIS — R92333 Mammographic heterogeneous density, bilateral breasts: Secondary | ICD-10-CM | POA: Diagnosis not present

## 2023-08-27 DIAGNOSIS — C159 Malignant neoplasm of esophagus, unspecified: Secondary | ICD-10-CM | POA: Diagnosis not present

## 2023-08-27 DIAGNOSIS — Z853 Personal history of malignant neoplasm of breast: Secondary | ICD-10-CM | POA: Diagnosis not present

## 2023-09-01 ENCOUNTER — Ambulatory Visit: Admitting: Infectious Diseases

## 2023-09-26 ENCOUNTER — Other Ambulatory Visit: Payer: Self-pay | Admitting: Internal Medicine

## 2023-09-27 IMAGING — CT CT MAXILLOFACIAL W/O CM
1 series · 15 of 30 positions shown, 19 images · non-contrast
Comparison: CT maxillofacial 04/25/2015

CLINICAL DATA: Atypical facial pain. Left cheek mass marked by
vitamin-E tablet. History of nasal septum surgery, cheek implants,
and face lift. History of breast cancer.

EXAM:
CT MAXILLOFACIAL WITHOUT CONTRAST
TECHNIQUE: Multidetector CT imaging of the maxillofacial structures was
performed. Multiplanar CT image reconstructions were also generated.

[Series 4: maxofacial soft · axial · 0.37mm/px · z∈[-141,-29]mm · 15 of 62 slices shown, 19 images]
[im 3/62  brain]
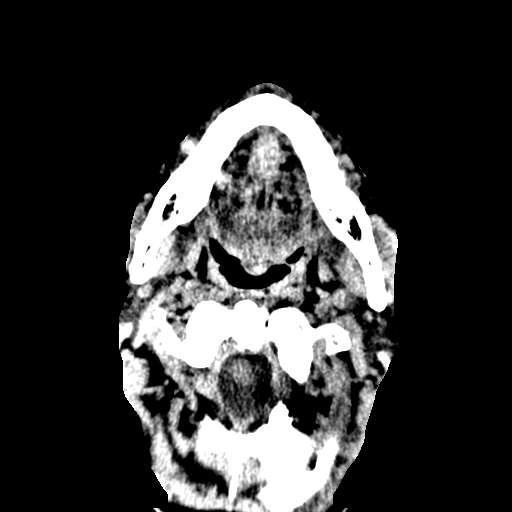
[im 3/62  bone]
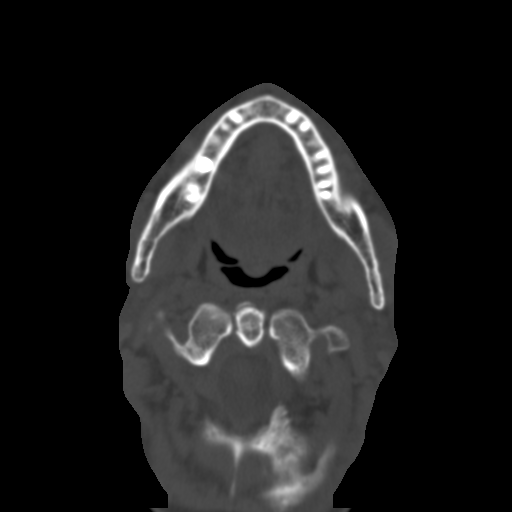
[im 7/62  bone]
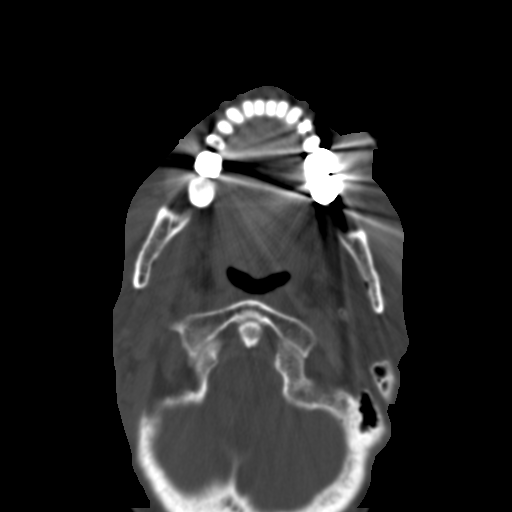
[im 11/62  bone]
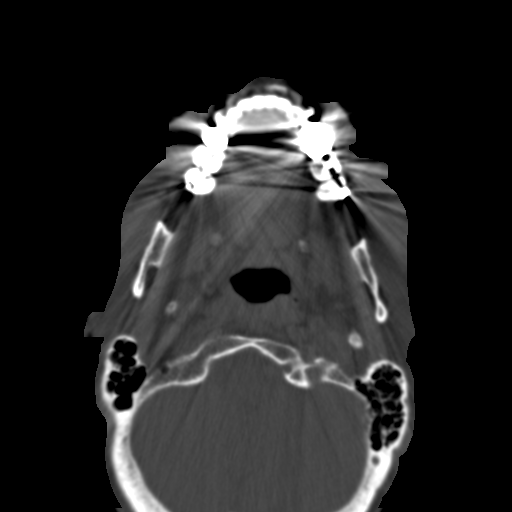
[im 15/62  bone]
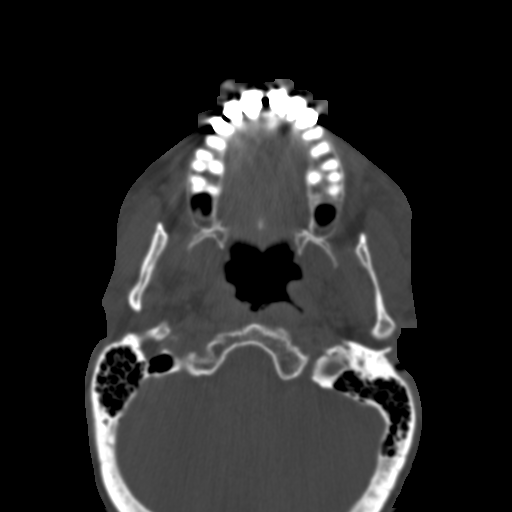
[im 19/62  brain]
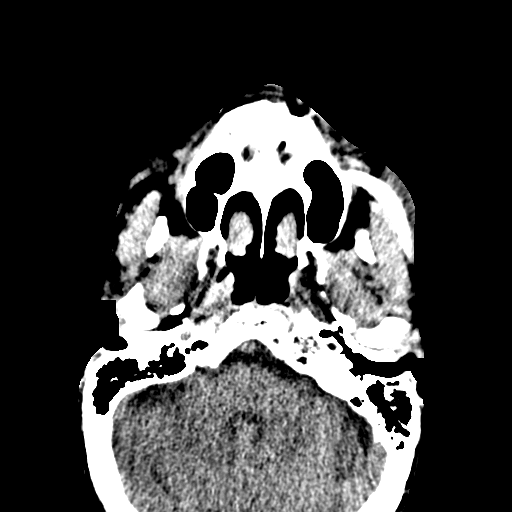
[im 19/62  bone]
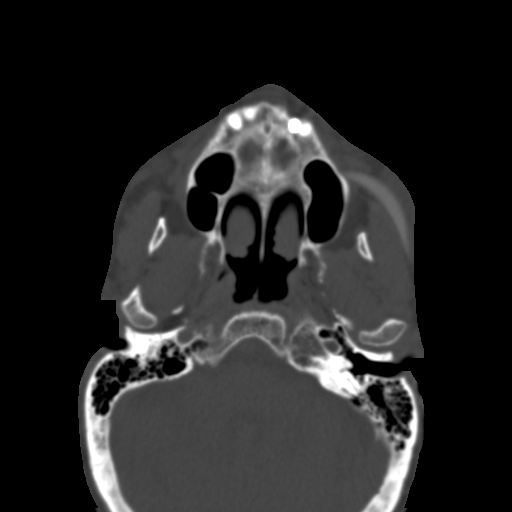
[im 24/62  bone]
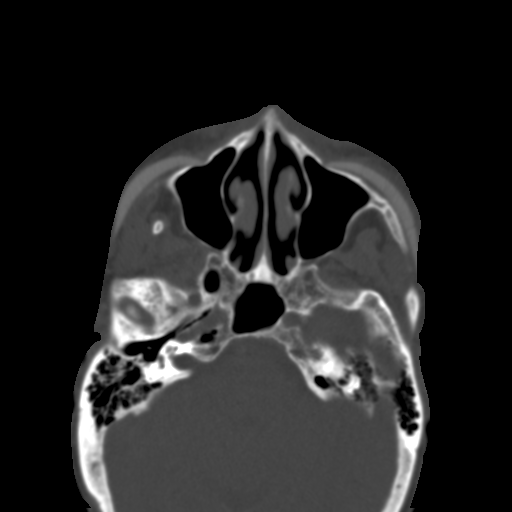
[im 28/62  bone]
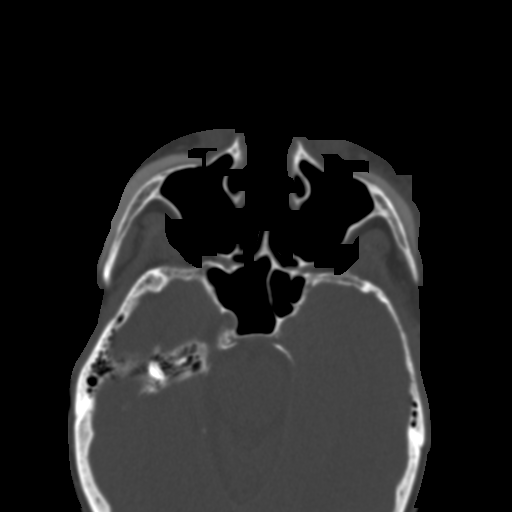
[im 32/62  bone]
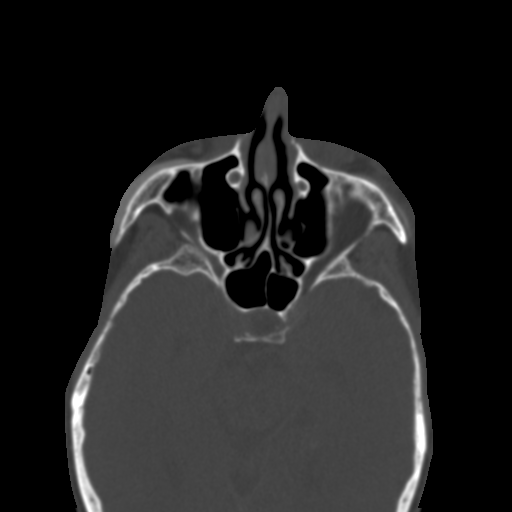
[im 34/62  brain]
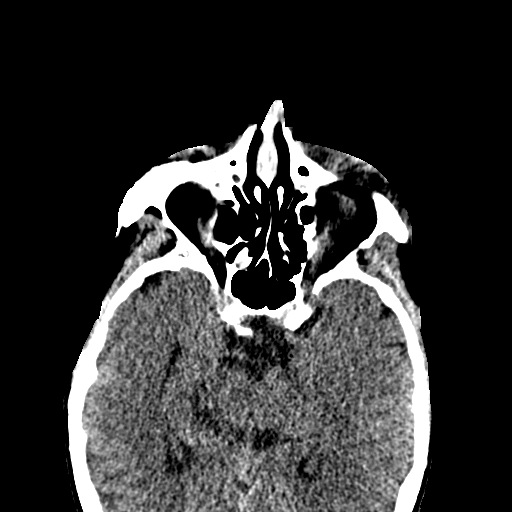
[im 34/62  bone]
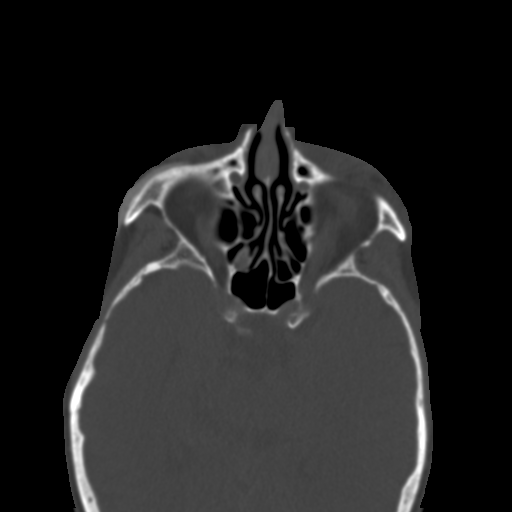
[im 38/62  bone]
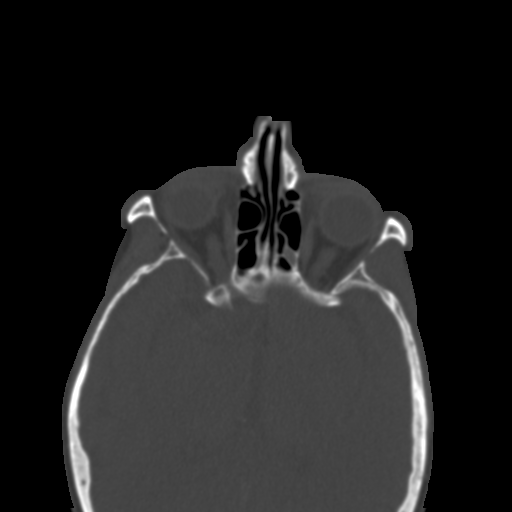
[im 43/62  bone]
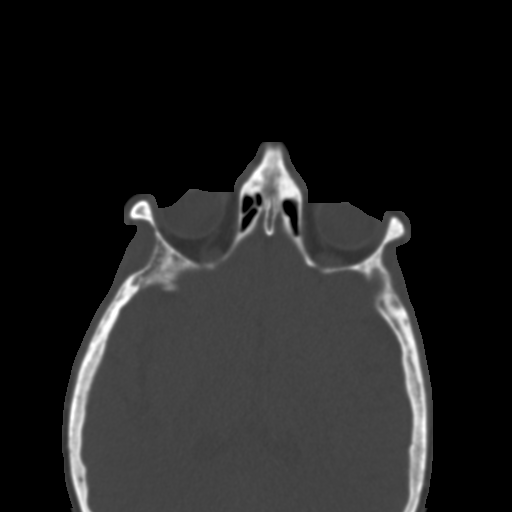
[im 47/62  bone]
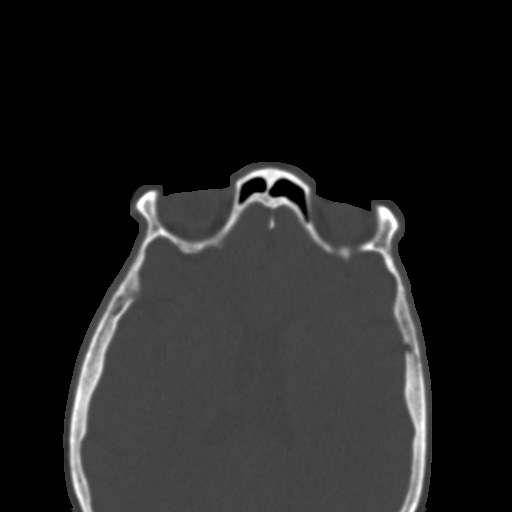
[im 51/62  brain]
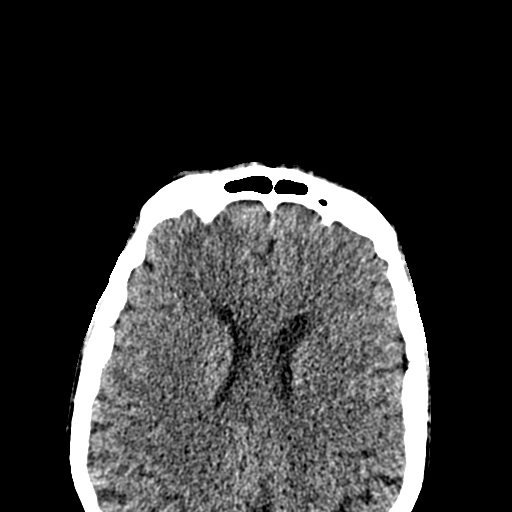
[im 51/62  bone]
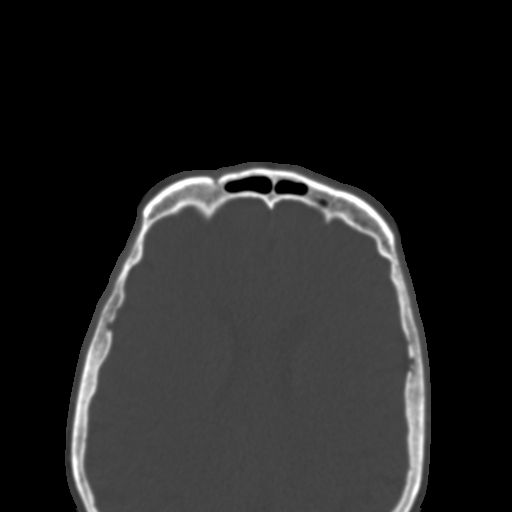
[im 55/62  bone]
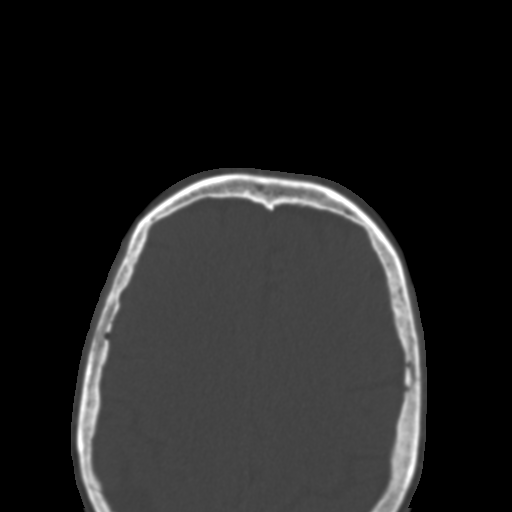
[im 59/62  bone]
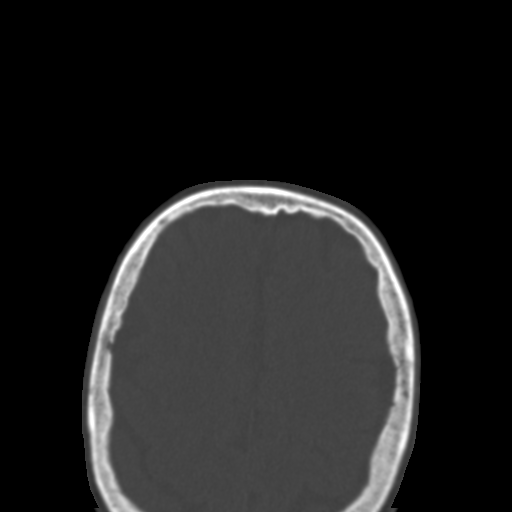

[15 of 30 positions shown; findings below may reference images not displayed]

FINDINGS: Osseous: No acute fracture or mandibular dislocation. No destructive
process. Old left nasal bone fracture noted.

Orbits: Negative. No traumatic or inflammatory finding.

Sinuses: Mild mucosal thickening in a few posterior bilateral
ethmoid air cells. The other paranasal sinuses and mastoid air cells
are clear.

Soft tissues: Directly underlying the vitamin E tablet, marking the
site of the left cheek mass, is a cheek implant. Right cheek implant
also noted. Bilateral soft tissue attenuation in the subcutaneous
tissues of the cheeks, left slightly greater than right. No focal
fluid collection. No skin thickening. No subcutaneous emphysema.

Limited intracranial: No significant or unexpected finding.
IMPRESSION: 1. Left cheek implant noted at marked site of left cheek mass. No
evidence of complication. No underlying osseous abnormality.
2. Scattered soft tissue attenuation in the subcutaneous tissues of
the cheeks, similar to 04/25/2015. Correlate for history of
injectable fillers.
3. No findings of maxillary sinusitis as queried.

## 2023-10-02 ENCOUNTER — Ambulatory Visit (INDEPENDENT_AMBULATORY_CARE_PROVIDER_SITE_OTHER): Admitting: Otolaryngology

## 2023-10-02 ENCOUNTER — Encounter (INDEPENDENT_AMBULATORY_CARE_PROVIDER_SITE_OTHER): Payer: Self-pay

## 2023-10-02 VITALS — BP 127/85 | HR 85 | Ht 64.5 in | Wt 108.0 lb

## 2023-10-02 DIAGNOSIS — Z9889 Other specified postprocedural states: Secondary | ICD-10-CM

## 2023-10-02 DIAGNOSIS — J342 Deviated nasal septum: Secondary | ICD-10-CM

## 2023-10-02 DIAGNOSIS — J34829 Nasal valve collapse, unspecified: Secondary | ICD-10-CM | POA: Diagnosis not present

## 2023-10-02 DIAGNOSIS — J3489 Other specified disorders of nose and nasal sinuses: Secondary | ICD-10-CM

## 2023-10-02 DIAGNOSIS — R0981 Nasal congestion: Secondary | ICD-10-CM

## 2023-10-02 MED ORDER — AZELASTINE HCL 0.1 % NA SOLN: 2.0000 | Freq: Two times a day (BID) | NASAL | 12 refills | Status: AC

## 2023-10-02 NOTE — Patient Instructions (Signed)
 You can try nasal cones at night -- can buy them at [facility] to see if they will help with breathing Use flonase two sprays each nostril twice per day; right after, use astelin  nasal spray twice per day  Aurel[facility] Med Nasal Saline Rinse  - start nasal saline rinses with [facility]Med Bottle available over the counter    Nasal Saline Irrigation instructions: If you choose to make your own salt water solution, You will need: Salt (kosher, canning, or pickling salt) Baking soda Nasal irrigation bottle (i.e. Aureliano Med Sinus Rinse) Measuring spoon ( teaspoon) Distilled / boiled water Mix solution Mix 1 teaspoon of salt, 1/2 teaspoon of baking soda and 1 cup of water into irrigation bottle ** May use saline packet instead of homemade recipe for this step if you prefer If medicine was prescribed to be mixed with solution, place this into bottle Examples 2 inches of 2% mupirocin  ointment Budesonide solution Position your head: Lean over sink (about 45 degrees) Rotate head (about 45 degrees) so that one nostril is above the other Irrigate Insert tip of irrigation bottle into upper nostril so it forms a comfortable seal Irrigate while breathing through your mouth May remove the straw from the bottle in order to irrigate the entire solution (important if medicine was added) Exhale through nose when finished and blow nose as necessary  Repeat on opposite side with other 1/2 of solution (120 mL) or remake solution if all 240 mL was used on first side Wash irrigation bottle regularly, replace every 3 months

## 2023-10-02 NOTE — Progress Notes (Signed)
 Dear Dr. Rollene, Here is my assessment for our mutual patient, Cheryl Lawson. Thank you for allowing me the opportunity to care for your patient. Please do not hesitate to contact me should you have any other questions. Sincerely, Dr. Eldora Blanch  Otolaryngology Clinic Note  HISTORY: Cheryl Lawson is a 73 y.o. female kindly referred by Dr. Rollene for evaluation of left facial swelling and right nasal obstruction.   Initial visit (09/2023): She reports that she has constant right nasal obstruction, and some discolored drainage and thick (feels like it won't come out) with relief after she gets some drainage out blowing her nose. Ongoing for several years. Nothing makes it worse, nothing really makes it better. No history of sinus infections or CRS symptoms - no facial pain/pressure, hyposmia, rhinorrhea. Flonase helps some, zyrtec potentially. She has not tried any other nasal medications. She has not tried rinses it appears. No typical AR symptoms.  She has had two prior rhinoplasties (1978, 2014) for cosmetic and functional -- with cartilage graft from the ear. She also had cheek implants (1985, 1987 revised, and 1995). She did have an episode of left facial swelling/redness/tenderness after she felt like Something moved. She was prescribed antibiotics and this improved her symptoms - especially the ciprofloxacin . Currently, she is not having any symptoms related to this including pain/drainage/swelling/fever.   She is currently using no nasal medications  GLP-1: no AP/AC: no  Tobacco: no  PMHx: Leukopenia, Breast Cancer, MDD, Hashimoto's (? - but not requiring meds)  RADIOGRAPHIC EVALUATION AND INDEPENDENT REVIEW OF OTHER RECORDS: Dr. Mable (ENT) 05/02/2016: noted right nasal obstruction; prior rhino in 2014 with right ear cartilage graft; breathing never really improved; also had another procedure in Feb 2017 with tip grafting; worsening obstruction; no h/o recurrent sinusitis; Rx:  Ref to [facility] ENT and Flonase Dr. Rollene (IM) 03/30/2023: worsening right nasal obstruction; Dx: right nasal obstruction; Rx: ref to ENT Dr. Corean Ku (08/14/2023): noted left facial swelling, seen on 08/07/2023 and Rx'd with Augmentin ; Dx: Cellulitis of face; Rx: Ciprofloxacin , ref to ID CBC 08/06/2023: WBC 5.9; TSH 03/30/2023: wnl CT Face 08/06/2023: reviewed independently - noted bilateral cheek implants, some stranding over left extending to NL fold; no obvious evidence of abscess; single isolated right posterior ethmoid cell opacification but no significant paranasal sinus disease; right NV opacification    Past Medical History:  Diagnosis Date   Anxiety    on meds   Breast cancer (HCC) 1998   Depression    on meds   Thyroid  disease    Past Surgical History:  Procedure Laterality Date   BREAST LUMPECTOMY     right   COSMETIC SURGERY     DILATION AND CURETTAGE OF UTERUS  1986   FACIAL COSMETIC SURGERY     infertility surgery     endometriosis   NASAL SEPTUM SURGERY     NECK SURGERY     lift    TONSILLECTOMY     Family History  Problem Relation Age of Onset   Ovarian cancer Mother    Cancer Mother    Prostate cancer Father 13   Colon polyps Neg Hx    Crohn's disease Neg Hx    Esophageal cancer Neg Hx    Rectal cancer Neg Hx    Stomach cancer Neg Hx    Social History   Tobacco Use   Smoking status: Never   Smokeless tobacco: Never  Substance Use Topics   Alcohol use: Yes    Comment: rare  No Known Allergies Current Outpatient Medications  Medication Sig Dispense Refill   ALPRAZolam  (XANAX ) 0.25 MG tablet TAKE 1 TABLET BY MOUTH AT BEDTIME AS NEEDED FOR ANXIETY. 30 tablet 3   azelastine  (ASTELIN ) 0.1 % nasal spray Place 2 sprays into both nostrils 2 (two) times daily. Use in each nostril as directed 30 mL 12   clobetasol  (TEMOVATE ) 0.05 % external solution Apply 1 Application topically 2 (two) times daily. 50 mL 3   ibuprofen  (ADVIL ) 200 MG tablet Take 200  mg by mouth 2 (two) times daily as needed.     Ibuprofen -diphenhydrAMINE HCl (ADVIL  PM) 200-25 MG CAPS Take 1 tablet by mouth at bedtime. As needed     minoxidil (ROGAINE) 2 % external solution Apply 1 application topically at bedtime. Put in 3 places     Multiple Vitamin (MULTIVITAMIN ADULT PO) Take 1 tablet by mouth daily at 6 (six) AM. Non formulary vitamins/minerals     Multiple Vitamins-Minerals (ZINC PO) Take 1 tablet by mouth daily at 6 (six) AM.     PARoxetine  (PAXIL ) 10 MG tablet Take 1 tablet (10 mg total) by mouth daily. 90 tablet 3   tretinoin (RETIN-A) 0.1 % cream SMARTSIG:sparingly Topical Every Night     No current facility-administered medications for this visit.   BP 127/85 (BP Location: Left Arm, Patient Position: Sitting, Cuff Size: Normal)   Pulse 85   Ht 5' 4.5 (1.638 m)   Wt 108 lb (49 kg)   SpO2 97%   BMI 18.25 kg/m   PHYSICAL EXAM:  BP 127/85 (BP Location: Left Arm, Patient Position: Sitting, Cuff Size: Normal)   Pulse 85   Ht 5' 4.5 (1.638 m)   Wt 108 lb (49 kg)   SpO2 97%   BMI 18.25 kg/m    Salient findings:  CN II-XII intact  Bilateral EAC clear and TM intact with well pneumatized middle ear spaces Nose: Anterior rhinoscopy reveals septum deviates right.  Nasal endoscopy was indicated to better evaluate the nose and paranasal sinuses, given the patient's history and exam findings, and is detailed below. No lesions of oral cavity/oropharynx No obviously palpable neck masses/lymphadenopathy/thyromegaly No respiratory distress or stridor Left cheek slight fullness, likely due to implant position, no erythema, swelling, tenderness Left concha bowl cartilage deficiency - likely site of harvest Evidence of prior rhinoplasty with firm tip, and relatively midline dorsum; unable to palpate cartilage over dorsum or ULC area   PROCEDURE:  Prior to initiating any procedures, risks/benefits/alternatives were explained to the patient and verbal consent  obtained. Diagnostic Nasal Endoscopy Pre-procedure diagnosis: Concern for sinusitis, nasal obstruction, rule out structural lesion Post-procedure diagnosis: same Indication: See pre-procedure diagnosis and physical exam above Complications: None apparent EBL: 0 mL Anesthesia: Lidocaine  4% and topical decongestant was topically sprayed in each nasal cavity  Description of Procedure:  Patient was identified. A rigid 30 degree endoscope was utilized to evaluate the sinonasal cavities, mucosa, sinus ostia and turbinates and septum.  Overall, signs of mucosal inflammation are not noted.  Also noted are clear right nasal valve obstruction with what appears to be cartilage - prior spreader graft? Which is lateralized and obstructing the nasal valve area; minimal mucoid secretions right MM; right septal deviation.  No mucopurulence, polyps, or masses noted.   Right Middle meatus: see above; no purulence Right SE Recess: clear Left MM: clear Left SE Recess: clear  CPT CODE -- 31231 - Mod 25   ASSESSMENT:  72 y.o. with:  1. Nasal obstruction  2. Nasal congestion   3. Nasal septal deviation   4. Nasal valve collapse   5. History of rhinoplasty   6.     History of facial implant with infection  We've discussed issues and options today.  I explained that given appearance of her nasal valve, do not think her symptoms will improve without anatomic correction; she is having perhaps some mucoid drainage and some improvement with flonase so will add astelin  BID and rinses to see if this will improve her symptoms, though unlikely. In addition, we also discussed her malar implants - she has already had one episode of infection and we discussed removal; she does not wish for this but I did offer her referral to [facility] or another tertiary care center for this and wishes to observe despite risks discussion.  The risks, benefits and alternatives were discussed and questions answered.  She has elected to proceed  with:  1) Daily rinses 2) BID flonase; add BID astelin  - f/u PRN -- if she wishes to address her obstruction or implants, happy to refer her since this would be multiple revisions  See below regarding exact medications prescribed this encounter including dosages and route: Meds ordered this encounter  Medications   azelastine  (ASTELIN ) 0.1 % nasal spray    Sig: Place 2 sprays into both nostrils 2 (two) times daily. Use in each nostril as directed    Dispense:  30 mL    Refill:  12     Thank you for allowing me the opportunity to care for your patient. Please do not hesitate to contact me should you have any other questions.  Sincerely, Eldora Blanch, MD Otolaryngologist (ENT), [facility] Phone: [phone] Fax: [phone]  MDM:  Level 4: [phone] Complexity/Problems addressed: mod - multiple problems Data complexity: mod - independent interpretation of CT imaging - Morbidity: mod  - Prescription Drug prescribed or managed: yes  10/02/2023, 11:20 AM

## 2023-10-09 ENCOUNTER — Institutional Professional Consult (permissible substitution) (INDEPENDENT_AMBULATORY_CARE_PROVIDER_SITE_OTHER)

## 2023-12-31 DIAGNOSIS — L820 Inflamed seborrheic keratosis: Secondary | ICD-10-CM | POA: Diagnosis not present

## 2023-12-31 DIAGNOSIS — D485 Neoplasm of uncertain behavior of skin: Secondary | ICD-10-CM | POA: Diagnosis not present

## 2023-12-31 DIAGNOSIS — Z85828 Personal history of other malignant neoplasm of skin: Secondary | ICD-10-CM | POA: Diagnosis not present

## 2023-12-31 DIAGNOSIS — L538 Other specified erythematous conditions: Secondary | ICD-10-CM | POA: Diagnosis not present

## 2023-12-31 DIAGNOSIS — Z08 Encounter for follow-up examination after completed treatment for malignant neoplasm: Secondary | ICD-10-CM | POA: Diagnosis not present

## 2023-12-31 DIAGNOSIS — L578 Other skin changes due to chronic exposure to nonionizing radiation: Secondary | ICD-10-CM | POA: Diagnosis not present

## 2024-01-26 ENCOUNTER — Ambulatory Visit: Payer: Self-pay

## 2024-01-26 ENCOUNTER — Encounter: Payer: Self-pay | Admitting: Internal Medicine

## 2024-01-26 NOTE — Telephone Encounter (Signed)
 FYI Only or Action Required?: FYI only for provider.  Patient was last seen in primary care on 08/14/2023 by Alvia Corean CROME, FNP.  Called Nurse Triage reporting Depression.  Symptoms began several weeks ago.  Interventions attempted: Nothing.  Symptoms are: gradually worsening.  Triage Disposition: See Physician Within 24 Hours  Patient/caregiver understands and will follow disposition?: Yes  **Appt. Scheduled for 8/13**        Copied from CRM #[id number]. Topic: Clinical - Red Word Triage >> Jan 26, 2024  4:24 PM [place] E wrote: [facility] that prompted transfer to Nurse Triage: When patient stopped PARoxetine  (PAXIL ) 10 MG tablet a few weeks ago she is becoming depressed, low energy, no interest in doing anything, stopped going on walks, no motivation. Reason for Disposition  [1] Depression AND [2] getting worse (e.g., sleeping poorly, less able to do activities of daily living)  Answer Assessment - Initial Assessment Questions 1. CONCERN: What happened that made you call today?      She feels unmotivated, or do daily tasks   2. DEPRESSION SYMPTOM SCREENING: How are you feeling overall? (e.g., decreased energy, increased sleeping or difficulty sleeping, difficulty concentrating, feelings of sadness, guilt, hopelessness, or worthlessness)      Trouble sleeping   3. RISK OF HARM - SUICIDAL IDEATION:  Do you ever have thoughts of hurting or killing yourself?  (e.g., yes, no, no but preoccupation with thoughts about death)     No   4. RISK OF HARM - HOMICIDAL IDEATION:  Do you ever have thoughts of hurting or killing someone else?  (e.g., yes, no, no but preoccupation with thoughts about death)     No   5. FUNCTIONAL IMPAIRMENT: How have things been going for you overall? Have you had more difficulty than usual doing your normal daily activities?  (e.g., better, same, worse; self-care, school, work, interactions)      More difficulty doing daily tasks  6.  SUPPORT: Who is with you now? Who do you live with? Do you have family or friends who you can talk to?       Has strong support system   7. THERAPIST: Do you have a counselor or therapist? If Yes, ask: What is their name?     No   8. STRESSORS: Has there been any new stress or recent changes in your life?     No    10. OTHER: Do you have any other physical symptoms right now? (e.g., fever) Decreased energy  Stopped PARoxetine  (PAXIL ) 10 MG a few weeks ago.  Protocols used: Depression-A-AH

## 2024-01-27 ENCOUNTER — Encounter: Payer: Self-pay | Admitting: Internal Medicine

## 2024-01-27 ENCOUNTER — Ambulatory Visit (INDEPENDENT_AMBULATORY_CARE_PROVIDER_SITE_OTHER): Admitting: Internal Medicine

## 2024-01-27 VITALS — BP 124/72 | HR 79 | Temp 97.6°F | Ht 64.5 in | Wt 111.6 lb

## 2024-01-27 DIAGNOSIS — E063 Autoimmune thyroiditis: Secondary | ICD-10-CM | POA: Diagnosis not present

## 2024-01-27 DIAGNOSIS — F418 Other specified anxiety disorders: Secondary | ICD-10-CM

## 2024-01-27 MED ORDER — BUPROPION HCL ER (XL) 150 MG PO TB24: 150.0000 mg | ORAL_TABLET | Freq: Every day | ORAL | 3 refills | Status: DC

## 2024-01-27 NOTE — Assessment & Plan Note (Signed)
 With recent worsening off the paxil  x 2 wks, no SI or HI - now for wellbutrin  xl 150 mg per day, and pt to call in 1 month for higher dosing if needed, and declines need for counseling at this time

## 2024-01-27 NOTE — Patient Instructions (Signed)
 Please take all new medication as prescribed - the wellbutrin   Please call in 1 month if you need the higher dosing  Please continue all other medications as before, and refills have been done if requested.  Please have the pharmacy call with any other refills you may need.  Please keep your appointments with your specialists as you may have planned

## 2024-01-27 NOTE — Telephone Encounter (Signed)
 Copied from CRM [id number]. Topic: Clinical - Medical Advice >> Jan 27, 2024 11:31 AM Rea BROCKS wrote: Reason for CRM: Patient just left the office today. But, she would like to know is there a such thing as a genetic test to see which anti-depressant she may be compatible with. She said she was told about it and wants to know if it is true, and if it is do we offer that test at the clinic? Patient said it's okay to respond via MyChart.    [phone] (M)

## 2024-01-27 NOTE — Progress Notes (Signed)
 Patient ID: Cheryl Lawson, female   DOB: Aug 03, 1950, 73 y.o.   MRN: [id number]        Chief Complaint: follow up depression anxiety       HPI:  Cheryl Lawson is a 73 y.o. female here with c/o wt gain with paxil  so stopped this 2 wks, and already feeling low mood, lower appetite, sleep difficulty  Pt states has mild worsening depressive symptoms, but no suicidal ideation, or panic; has ongoing anxiety as well.  Pt has looked on the internet and wants to avoid SSRI for now, but prefers wellbutrin  xl with low risk of wt gain or sexual side effects.  Pt denies chest pain, increased sob or doe, wheezing, orthopnea, PND, increased LE swelling, palpitations, dizziness or syncope.   Pt denies polydipsia, polyuria, or new focal neuro s/s.          Wt Readings from Last 3 Encounters:  01/27/24 111 lb 9.6 oz (50.6 kg)  10/02/23 108 lb (49 kg)  08/14/23 107 lb (48.5 kg)   BP Readings from Last 3 Encounters:  01/27/24 124/72  10/02/23 127/85  08/14/23 116/80         Past Medical History:  Diagnosis Date   Anxiety    on meds   Breast cancer (HCC) 1998   Depression    on meds   Thyroid  disease    Past Surgical History:  Procedure Laterality Date   BREAST LUMPECTOMY     right   COSMETIC SURGERY     DILATION AND CURETTAGE OF UTERUS  1986   FACIAL COSMETIC SURGERY     infertility surgery     endometriosis   NASAL SEPTUM SURGERY     NECK SURGERY     lift    TONSILLECTOMY      reports that she has never smoked. She has never used smokeless tobacco. She reports current alcohol use. She reports that she does not use drugs. family history includes Cancer in her mother; Ovarian cancer in her mother; Prostate cancer (age of onset: 51) in her father. No Known Allergies Current Outpatient Medications on File Prior to Visit  Medication Sig Dispense Refill   ALPRAZolam  (XANAX ) 0.25 MG tablet TAKE 1 TABLET BY MOUTH AT BEDTIME AS NEEDED FOR ANXIETY. 30 tablet 3   azelastine  (ASTELIN ) 0.1 % nasal spray  Place 2 sprays into both nostrils 2 (two) times daily. Use in each nostril as directed 30 mL 12   clobetasol  (TEMOVATE ) 0.05 % external solution Apply 1 Application topically 2 (two) times daily. 50 mL 3   ibuprofen  (ADVIL ) 200 MG tablet Take 200 mg by mouth 2 (two) times daily as needed.     Ibuprofen -diphenhydrAMINE HCl (ADVIL  PM) 200-25 MG CAPS Take 1 tablet by mouth at bedtime. As needed     minoxidil (ROGAINE) 2 % external solution Apply 1 application topically at bedtime. Put in 3 places     Multiple Vitamin (MULTIVITAMIN ADULT PO) Take 1 tablet by mouth daily at 6 (six) AM. Non formulary vitamins/minerals     Multiple Vitamins-Minerals (ZINC PO) Take 1 tablet by mouth daily at 6 (six) AM.     tretinoin (RETIN-A) 0.1 % cream SMARTSIG:sparingly Topical Every Night     No current facility-administered medications on file prior to visit.        ROS:  All others reviewed and negative.  Objective        PE:  BP 124/72   Pulse 79   Temp 97.6 F (  36.4 C)   Ht 5' 4.5 (1.638 m)   Wt 111 lb 9.6 oz (50.6 kg)   SpO2 99%   BMI 18.86 kg/m                 Constitutional: Pt appears in NAD               HENT: Head: NCAT.                Right Ear: External ear normal.                 Left Ear: External ear normal.                Eyes: . Pupils are equal, round, and reactive to light. Conjunctivae and EOM are normal               Nose: without d/c or deformity               Neck: Neck supple. Gross normal ROM               Cardiovascular: Normal rate and regular rhythm.                 Pulmonary/Chest: Effort normal and breath sounds without rales or wheezing.                               Neurological: Pt is alert. At baseline orientation, motor grossly intact               Skin: Skin is warm. No rashes, no other new lesions, LE edema - none               Psychiatric: Pt behavior is normal without agitation with depressed affect  Micro: none  Cardiac tracings I have personally  interpreted today:  none  Pertinent Radiological findings (summarize): none   Lab Results  Component Value Date   WBC 5.9 08/06/2023   HGB 15.3 (H) 08/06/2023   HCT 45.8 08/06/2023   PLT 207 08/06/2023   GLUCOSE 83 08/06/2023   CHOL 239 (H) 03/30/2023   TRIG 92.0 03/30/2023   HDL 75.00 03/30/2023   LDLCALC 145 (H) 03/30/2023   ALT 18 03/30/2023   AST 36 03/30/2023   NA 137 08/06/2023   K 4.5 08/06/2023   CL 100 08/06/2023   CREATININE 1.26 (H) 08/06/2023   BUN 19 08/06/2023   CO2 29 08/06/2023   TSH 1.92 03/30/2023   INR 1.0 07/10/2015   Assessment/Plan:  Cheryl Lawson is a 73 y.o. White or Caucasian [1] female with  has a past medical history of Anxiety, Breast cancer (HCC) (1998), Depression, and Thyroid  disease.  Hashimoto's disease Lab Results  Component Value Date   TSH 1.92 03/30/2023   Stable, pt to continue as is, does not require med tx at this time   Depression with anxiety With recent worsening off the paxil  x 2 wks, no SI or HI - now for wellbutrin  xl 150 mg per day, and pt to call in 1 month for higher dosing if needed, and declines need for counseling at this time  Followup: Return if symptoms worsen or fail to improve.  Lynwood Rush, MD 01/27/2024 1:04 PM [facility]

## 2024-01-27 NOTE — Assessment & Plan Note (Signed)
 Lab Results  Component Value Date   TSH 1.92 03/30/2023   Stable, pt to continue as is, does not require med tx at this time

## 2024-02-05 ENCOUNTER — Other Ambulatory Visit: Payer: Self-pay | Admitting: Internal Medicine

## 2024-02-08 NOTE — Telephone Encounter (Signed)
 Needs visit for future.

## 2024-02-16 ENCOUNTER — Ambulatory Visit: Payer: Self-pay

## 2024-02-16 NOTE — Telephone Encounter (Signed)
 FYI Only or Action Required?: FYI only for provider.  Patient was last seen in primary care on 01/27/2024 by Norleen Lynwood ORN, MD.  Called Nurse Triage reporting Depression.  Symptoms began Ongoing.  Interventions attempted: Prescription medications: Wellbutrin .  Symptoms are: unchanged.  Triage Disposition: See PCP Within 2 Weeks  Patient/caregiver understands and will follow disposition?: Yes    Copied from CRM [id number]. Topic: Clinical - Prescription Issue >> Feb 16, 2024  1:48 PM Precious C wrote: Reason for CRM: Patient called with questions regarding her medication Wellbutrin . She inquired if she should continue taking Wellbutrin  for the next two weeks or if an alternative antidepressant can be prescribed. Patient stated she has been having a difficult time emotionally, reporting frequent crying, and would like guidance on how to proceed with treatment. She is also interested in discussing genetic testing. Patient is requesting a follow-up via telephone or MyChart. Best contact number: [phone]. Reason for Disposition  [1] Started on anti-depressant medication < 2 weeks ago AND [2] not feeling any better  Answer Assessment - Initial Assessment Questions Previously taking Paxil  before, tried to make it without it stated she felt like a zombie. Started Wellbutrin  about 2 weeks ago. Still on Extended Release. Stated feeling so low in the shower this morning, thought just let it be over. Denies feeling suicidal or having any plan to do so. Notices feeling wired right after taking it, about 4 hours in feels a lull hits, gets sleepy. Educated patient on typical timeline of effects for Wellbutrin , since she has only been on it about 2 weeks. Patient is wondering about being able to couple it with a low dose SSRI. Scheduled 9/8 to begin the genetic testing process she has been requesting. Given behavioral health center contact information as well.    1. CONCERN: What happened that  made you call today?      Depression  2. DEPRESSION SYMPTOM SCREENING: How are you feeling overall? (e.g., decreased energy, increased sleeping or difficulty sleeping, difficulty concentrating, feelings of sadness, guilt, hopelessness, or worthlessness)     Crying all the time, feels any little problem becomes so big, feels hopeless.  3. RISK OF HARM - SUICIDAL IDEATION:  Do you ever have thoughts of hurting or killing yourself?  (e.g., yes, no, no but preoccupation with thoughts about death)     No  4. RISK OF HARM - HOMICIDAL IDEATION:  Do you ever have thoughts of hurting or killing someone else?  (e.g., yes, no, no but preoccupation with thoughts about death)     No  5.  SUPPORT: Who is with you now? Who do you live with? Do you have family or friends who you can talk to?      Lives with husband, able to talk to husband.  6. THERAPIST: Do you have a counselor or therapist? If Yes, ask: What is their name?     No  7. OTHER: Do you have any other physical symptoms right now? (e.g., fever)       No  Protocols used: Depression-A-AH

## 2024-02-16 NOTE — Telephone Encounter (Signed)
 This RN attempted to contact patient. No answer, left voicemail to return call.       Copied from CRM [id number]. Topic: Clinical - Prescription Issue >> Feb 16, 2024  1:48 PM Cheryl Lawson wrote: Reason for CRM: Patient called with questions regarding her medication Wellbutrin . She inquired if she should continue taking Wellbutrin  for the next two weeks or if an alternative antidepressant can be prescribed. Patient stated she has been having a difficult time emotionally, reporting frequent crying, and would like guidance on how to proceed with treatment. She is also interested in discussing genetic testing. Patient is requesting a follow-up via telephone or MyChart. Best contact number: [phone].

## 2024-02-16 NOTE — Telephone Encounter (Signed)
**Note De-identified  [place] Obfuscation** Please advise 

## 2024-02-22 ENCOUNTER — Ambulatory Visit: Admitting: Internal Medicine

## 2024-02-22 ENCOUNTER — Encounter: Payer: Self-pay | Admitting: Internal Medicine

## 2024-02-22 VITALS — BP 114/80 | HR 66 | Temp 97.9°F | Ht 64.5 in | Wt 109.0 lb

## 2024-02-22 DIAGNOSIS — F418 Other specified anxiety disorders: Secondary | ICD-10-CM

## 2024-02-22 DIAGNOSIS — F331 Major depressive disorder, recurrent, moderate: Secondary | ICD-10-CM | POA: Diagnosis not present

## 2024-02-22 DIAGNOSIS — R519 Headache, unspecified: Secondary | ICD-10-CM | POA: Diagnosis not present

## 2024-02-22 MED ORDER — BUPROPION HCL 75 MG PO TABS: 75.0000 mg | ORAL_TABLET | Freq: Every day | ORAL | 0 refills | Status: DC

## 2024-02-22 NOTE — Patient Instructions (Signed)
 We will do the genepsych

## 2024-02-22 NOTE — Progress Notes (Signed)
 Subjective:   Patient ID: Cheryl Lawson, female    DOB: 1950/11/22, 73 y.o.   MRN: [id number]  Discussed the use of AI scribe software for clinical note transcription with the patient, who gave verbal consent to proceed.  History of Present Illness Cheryl Lawson is a 73 year old female who presents with ongoing facial issues and depression.  She has been experiencing persistent and serious facial issues for years, describing them as painful and frustrating. She wears tape to prevent facial collapse and has undergone numerous scans, procedures, and injections. She reported a past infection and that at one point she thought the implants might have needed to be removed. She has contacted the doctor in [place]  who originally placed the cheek implants, as local surgeons are hesitant to intervene.  She is experiencing significant depressive symptoms, including feelings of dread, crying spells, and a lack of appetite. She has been on Wellbutrin  for 25 days, but it has not provided the desired relief. Initially, she felt energetic and motivated, but these effects have waned. She notes feeling worse shortly after taking the medication and describes a lack of improvement in her mood. She previously took Paxil , which she stopped due to feeling like a 'zombie' and concerns about weight gain.  Her social situation has been stressful, with her daughter bringing home two puppies and a stray cat, adding to her responsibilities. She expresses concerns about being alone in the future and not being fully aware of her financial situation, which is managed by her husband, Cheryl Lawson.      01/27/2024   10:36 AM 03/30/2023    9:55 AM 03/28/2022    9:40 AM 09/20/2020    2:03 PM 05/16/2020   10:57 AM  Depression screen PHQ 2/9  Decreased Interest 2 0 1 0 0  Down, Depressed, Hopeless 3 0 1 0 0  PHQ - 2 Score 5 0 2 0 0  Altered sleeping 2 0 2 0   Tired, decreased energy 3 0 1 0   Change in appetite 1 0 1 0   Feeling  bad or failure about yourself  1 0 0 0   Trouble concentrating 1 1 2  0   Moving slowly or fidgety/restless 1 0 2 0   Suicidal thoughts 0 0 0 0   PHQ-9 Score 14 1 10  0   Difficult doing work/chores Somewhat difficult Not difficult at all          01/27/2024   10:37 AM  GAD 7 : Generalized Anxiety Score  Nervous, Anxious, on Edge 2  Control/stop worrying 3  Worry too much - different things 3  Trouble relaxing 1  Restless 1  Easily annoyed or irritable 2  Afraid - awful might happen 2  Total GAD 7 Score 14  Anxiety Difficulty Somewhat difficult     Review of Systems  Constitutional: Negative.   HENT: Negative.    Eyes: Negative.   Respiratory:  Negative for cough, chest tightness and shortness of breath.   Cardiovascular:  Negative for chest pain, palpitations and leg swelling.  Gastrointestinal:  Negative for abdominal distention, abdominal pain, constipation, diarrhea, nausea and vomiting.  Musculoskeletal:  Positive for myalgias.  Skin: Negative.   Neurological: Negative.   Psychiatric/Behavioral:  Positive for decreased concentration, dysphoric mood and sleep disturbance. The patient is nervous/anxious.     Objective:  Physical Exam Constitutional:      Appearance: She is well-developed.  HENT:     Head: Normocephalic and atraumatic.  Cardiovascular:     Rate and Rhythm: Normal rate and regular rhythm.  Pulmonary:     Effort: Pulmonary effort is normal. No respiratory distress.     Breath sounds: Normal breath sounds. No wheezing or rales.  Abdominal:     General: Bowel sounds are normal. There is no distension.     Palpations: Abdomen is soft.     Tenderness: There is no abdominal tenderness.  Musculoskeletal:     Cervical back: Normal range of motion.  Skin:    General: Skin is warm and dry.  Neurological:     Mental Status: She is alert and oriented to person, place, and time.     Coordination: Coordination normal.     Vitals:   02/22/24 1441  BP:  114/80  Pulse: 66  Temp: 97.9 F (36.6 C)  TempSrc: Oral  SpO2: 92%  Weight: 109 lb (49.4 kg)  Height: 5' 4.5 (1.638 m)   I personally spent a total of 32 minutes in the care of the patient today including getting/reviewing separately obtained history, performing a medically appropriate exam/evaluation, and counseling and educating.  Assessment and Plan Assessment & Plan Major depressive disorder, single episode, unspecified   She experiences depressive symptoms with reduced Wellbutrin  efficacy and side effects of jitteriness and nausea. She has tried and failed paxil  and wellbutrin  in the last 2 years. Genetic testing for medication tailoring was discussed. Order genetic testing for personalized medication selection genesight and sample collected during visit. Reduce Wellbutrin  dose to 75 mg once daily while awaiting genetic test results.  Complications of facial implants   Significant issues with facial implants cause pain and require tape for support. The original surgeon in [place]  has been contacted for further evaluation. Follow up with the original surgeon in [place]  regarding facial implant complications.

## 2024-02-23 ENCOUNTER — Encounter: Payer: Self-pay | Admitting: Internal Medicine

## 2024-02-25 DIAGNOSIS — R519 Headache, unspecified: Secondary | ICD-10-CM | POA: Insufficient documentation

## 2024-02-25 NOTE — Assessment & Plan Note (Signed)
 Significant issues with facial implants cause pain and require tape for support. The original surgeon in [place]  has been contacted for further evaluation by patient. Follow up with the original surgeon in [place]  regarding facial implant complications.

## 2024-02-25 NOTE — Assessment & Plan Note (Signed)
 She experiences depressive symptoms with reduced Wellbutrin  efficacy and side effects of jitteriness and nausea. She has tried and failed paxil  and wellbutrin  in the last 2 years. Genetic testing for medication tailoring was discussed. Order genetic testing for personalized medication selection genesight and sample collected during visit. Reduce Wellbutrin  dose to 75 mg once daily while awaiting genetic test results.

## 2024-03-07 ENCOUNTER — Telehealth: Payer: Self-pay | Admitting: Internal Medicine

## 2024-03-07 MED ORDER — VORTIOXETINE HBR 20 MG PO TABS: 20.0000 mg | ORAL_TABLET | Freq: Every day | ORAL | 1 refills | Status: DC

## 2024-03-07 NOTE — Telephone Encounter (Signed)
 We have the results of her genetic testing and there are several medications we should avoid for her treatment. I recommend we try trintellix  which is a good medicine for her and one that can help. I have sent in the starting dose follow up 1 month. Take 1/2 pill daily for first 1 week then increase to 1 pill daily.

## 2024-03-11 NOTE — Telephone Encounter (Signed)
 Patient stated that her insurance was applied but still expensive and wanted to get a better understanding on the price but I can send to the prior authorization team.

## 2024-03-14 ENCOUNTER — Other Ambulatory Visit (HOSPITAL_COMMUNITY): Payer: Self-pay

## 2024-03-14 ENCOUNTER — Other Ambulatory Visit: Payer: Self-pay | Admitting: Internal Medicine

## 2024-03-15 ENCOUNTER — Other Ambulatory Visit: Payer: Self-pay | Admitting: Internal Medicine

## 2024-03-19 ENCOUNTER — Other Ambulatory Visit: Payer: Self-pay | Admitting: Internal Medicine

## 2024-03-23 ENCOUNTER — Other Ambulatory Visit: Payer: Self-pay | Admitting: Internal Medicine

## 2024-03-23 MED ORDER — SERTRALINE HCL 50 MG PO TABS: 50.0000 mg | ORAL_TABLET | Freq: Every day | ORAL | 3 refills | Status: DC

## 2024-03-23 NOTE — Telephone Encounter (Signed)
 Sent in sertraline . Please for future we need a new encounter for this and should not just use old message as I cannot prescribe in this encounter. Follow up 4 weeks

## 2024-03-30 ENCOUNTER — Ambulatory Visit: Admitting: Internal Medicine

## 2024-03-30 ENCOUNTER — Encounter: Payer: Self-pay | Admitting: Internal Medicine

## 2024-03-30 NOTE — Telephone Encounter (Signed)
 I have responded in another message

## 2024-04-04 ENCOUNTER — Ambulatory Visit (INDEPENDENT_AMBULATORY_CARE_PROVIDER_SITE_OTHER): Admitting: Internal Medicine

## 2024-04-04 ENCOUNTER — Ambulatory Visit: Payer: Self-pay | Admitting: Internal Medicine

## 2024-04-04 ENCOUNTER — Encounter: Payer: Self-pay | Admitting: Internal Medicine

## 2024-04-04 VITALS — BP 102/76 | HR 85 | Temp 97.8°F | Ht 64.5 in | Wt 109.0 lb

## 2024-04-04 DIAGNOSIS — F418 Other specified anxiety disorders: Secondary | ICD-10-CM | POA: Diagnosis not present

## 2024-04-04 DIAGNOSIS — Z01810 Encounter for preprocedural cardiovascular examination: Secondary | ICD-10-CM

## 2024-04-04 LAB — URINALYSIS, ROUTINE W REFLEX MICROSCOPIC
Bilirubin Urine: NEGATIVE
Hgb urine dipstick: NEGATIVE
Ketones, ur: NEGATIVE
Leukocytes,Ua: NEGATIVE
Nitrite: NEGATIVE
RBC / HPF: NONE SEEN (ref 0–?)
Specific Gravity, Urine: <=1.005 — AB (ref 1.000–1.030)
Total Protein, Urine: NEGATIVE
Urine Glucose: NEGATIVE
Urobilinogen, UA: 0.2 (ref 0.0–1.0)
WBC, UA: NONE SEEN (ref 0–?)
pH: 6 (ref 5.0–8.0)

## 2024-04-04 LAB — CBC WITH DIFFERENTIAL/PLATELET
Basophils Absolute: 0 K/uL (ref 0.0–0.1)
Basophils Relative: 1.2 % (ref 0.0–3.0)
Eosinophils Absolute: 0.1 K/uL (ref 0.0–0.7)
Eosinophils Relative: 3.5 % (ref 0.0–5.0)
HCT: 43.2 % (ref 36.0–46.0)
Hemoglobin: 14.3 g/dL (ref 12.0–15.0)
Lymphocytes Relative: 21.9 % (ref 12.0–46.0)
Lymphs Abs: 0.8 K/uL (ref 0.7–4.0)
MCHC: 33.2 g/dL (ref 30.0–36.0)
MCV: 90.8 fl (ref 78.0–100.0)
Monocytes Absolute: 0.4 K/uL (ref 0.1–1.0)
Monocytes Relative: 10.1 % (ref 3.0–12.0)
Neutro Abs: 2.5 K/uL (ref 1.4–7.7)
Neutrophils Relative %: 63.3 % (ref 43.0–77.0)
Platelets: 182 K/uL (ref 150.0–400.0)
RBC: 4.76 Mil/uL (ref 3.87–5.11)
RDW: 13.5 % (ref 11.5–15.5)
WBC: 3.9 K/uL — ABNORMAL LOW (ref 4.0–10.5)

## 2024-04-04 LAB — COMPREHENSIVE METABOLIC PANEL WITH GFR
ALT: 15 U/L (ref 0–35)
AST: 27 U/L (ref 0–37)
Albumin: 4.5 g/dL (ref 3.5–5.2)
Alkaline Phosphatase: 59 U/L (ref 39–117)
BUN: 17 mg/dL (ref 6–23)
CO2: 28 meq/L (ref 19–32)
Calcium: 9.2 mg/dL (ref 8.4–10.5)
Chloride: 103 meq/L (ref 96–112)
Creatinine, Ser: 0.89 mg/dL (ref 0.40–1.20)
GFR: 65.44 mL/min (ref >60.00)
Glucose, Bld: 67 mg/dL — ABNORMAL LOW (ref 70–99)
Potassium: 3.9 meq/L (ref 3.5–5.1)
Sodium: 139 meq/L (ref 135–145)
Total Bilirubin: 0.5 mg/dL (ref 0.2–1.2)
Total Protein: 6.7 g/dL (ref 6.0–8.3)

## 2024-04-04 NOTE — Assessment & Plan Note (Signed)
 Her mood has improved with increased interest in activities and no crying episodes. She is not on antidepressants and is content with her current state. Paxil  was effective but is not recommended due to interactions. Trintellix  caused side effects and was ineffective. Consider Prozac if future antidepressant therapy is needed. She will contact if symptoms worsen or medication is needed.

## 2024-04-04 NOTE — Patient Instructions (Addendum)
 We will get the informatio,n faxed back tomorrow once the labs are back.

## 2024-04-04 NOTE — Assessment & Plan Note (Signed)
 EKG done and stable and will check CBC and CMP and U/A per surgeon request. Assuming labs normal low risk to proceed and should have surgery as planned

## 2024-04-04 NOTE — Progress Notes (Signed)
 Subjective:   Patient ID: Cheryl Lawson, female    DOB: 11/06/1950, 73 y.o.   MRN: [id number]  Discussed the use of AI scribe software for clinical note transcription with the patient, who gave verbal consent to proceed.  History of Present Illness Cheryl Lawson is a 73 year old female who presents for a follow-up visit regarding her mental health and medication management/pre-op visit  She is currently not taking any medications and feels well. She had previously been on Paxil  intermittently over a couple of years, but it was not recommended for her due to potential interactions, despite its effectiveness for her sister. She also tried Trintellix , which caused an unusual side effect of body odor without significant benefit, and was expensive even after insurance coverage.  She is surprised at her current well-being, noting no crying spells, increased interest in activities, and getting out more. She describes a sense of peace and acceptance with her current state, even if her upcoming surgery in [place]  does not happen. She mentions that regressions in her condition are not as severe, and she is content with any small progress. Denies chest pains or SOB.   EKG: Rate 75, axis normal, interval normal, sinus, possible LAE, no st or t wave changes, no significant change compared to prior 2021 right axis is resolved and LAE present in prior  Review of Systems  Constitutional: Negative.   HENT: Negative.    Eyes: Negative.   Respiratory:  Negative for cough, chest tightness and shortness of breath.   Cardiovascular:  Negative for chest pain, palpitations and leg swelling.  Gastrointestinal:  Negative for abdominal distention, abdominal pain, constipation, diarrhea, nausea and vomiting.  Musculoskeletal: Negative.   Skin: Negative.   Neurological: Negative.   Psychiatric/Behavioral: Negative.      Objective:  Physical Exam Constitutional:      Appearance: She is well-developed.  HENT:      Head: Normocephalic and atraumatic.  Cardiovascular:     Rate and Rhythm: Normal rate and regular rhythm.  Pulmonary:     Effort: Pulmonary effort is normal. No respiratory distress.     Breath sounds: Normal breath sounds. No wheezing or rales.  Abdominal:     General: Bowel sounds are normal. There is no distension.     Palpations: Abdomen is soft.     Tenderness: There is no abdominal tenderness.  Musculoskeletal:     Cervical back: Normal range of motion.  Skin:    General: Skin is warm and dry.  Neurological:     Mental Status: She is alert and oriented to person, place, and time.     Coordination: Coordination normal.     Vitals:   04/04/24 1055  BP: 102/76  Pulse: 85  Temp: 97.8 F (36.6 C)  TempSrc: Temporal  SpO2: 99%  Weight: 109 lb (49.4 kg)  Height: 5' 4.5 (1.638 m)    Assessment and Plan Assessment & Plan Depression   Her mood has improved with increased interest in activities and no crying episodes. She is not on antidepressants and is content with her current state. Paxil  was effective but is not recommended due to interactions. Trintellix  caused side effects and was ineffective. Consider Prozac if future antidepressant therapy is needed. She will contact if symptoms worsen or medication is needed.  Pre-op cardiovascular clearance: EKG done and stable and will check CBC and CMP and U/A per surgeon request. Assuming labs normal low risk to proceed and should have surgery as planned

## 2024-04-04 NOTE — Telephone Encounter (Signed)
**Note De-identified  [place] Obfuscation** Please advise 

## 2024-04-05 ENCOUNTER — Ambulatory Visit: Admitting: Internal Medicine

## 2024-04-07 ENCOUNTER — Telehealth: Payer: Self-pay

## 2024-04-07 ENCOUNTER — Ambulatory Visit

## 2024-04-07 NOTE — Progress Notes (Signed)
 Yes this was sent in by [facility]

## 2024-04-07 NOTE — Telephone Encounter (Signed)
 Forms for surgical clarence were faxed back successfully Cheryl Lawson M.D

## 2024-04-11 ENCOUNTER — Ambulatory Visit: Admitting: Internal Medicine

## 2024-04-12 DIAGNOSIS — R29810 Facial weakness: Secondary | ICD-10-CM | POA: Diagnosis not present

## 2024-04-21 ENCOUNTER — Other Ambulatory Visit: Payer: Self-pay | Admitting: Internal Medicine

## 2024-05-15 ENCOUNTER — Encounter: Payer: Self-pay | Admitting: Internal Medicine

## 2024-05-16 ENCOUNTER — Ambulatory Visit: Payer: Self-pay

## 2024-05-16 NOTE — Telephone Encounter (Signed)
 This RN made first attempt to contact pt with no answer. A voicemail was left with call back number provided.    Copied from CRM #[id number]. Topic: Clinical - Red Word Triage >> May 16, 2024  3:35 PM Thersia C wrote: [facility] that prompted transfer to Nurse Triage: Patient called in regaridng needing her prescription PROZAC stated she is getting really bad regarding her depression and she is not well >> May 16, 2024  3:41 PM Thersia C wrote: Patient disconnected from the call while waiting, please call patient back to followup

## 2024-05-16 NOTE — Telephone Encounter (Signed)
 FYI Only or Action Required?: FYI only for provider: appointment scheduled on 05/18/24.  Patient was last seen in primary care on 04/04/2024 by Rollene Almarie LABOR, MD.  Called Nurse Triage reporting Depression (/).  Symptoms began several days ago.  Interventions attempted: Nothing.  Symptoms are: gradually worsening.  Triage Disposition: See Physician Within 24 Hours  Patient/caregiver understands and will follow disposition?: No, refuses disposition  Reason for Disposition  [1] Depression AND [2] getting worse (e.g., sleeping poorly, less able to do activities of daily living)  Answer Assessment - Initial Assessment Questions Pt reports hx of anxiety and depression, worse recently d/t a number of stressors including a recent failed facial surgery to help with muscle twitching after face lift years ago, daughter and husband both needing surgery soon, recent left breast reconstruction, and holiday stress. Denies SI or HI, does state she wishes she could snap her fingers and be gone. Denies active plan. Able to care for self and go about daily activities but with less interest. Currently at home with husband. Pt discussed medication for anxiety and depression with PCP previously, was asked to reach out to notify PCP if/when she wants to start medication. Pt states she would like to start Prozac. Offered appt with different provider at home office tomorrow, declines d/t obligations tomorrow. Scheduled with PCP on Wednesday when pt will be able to come in. Advised UC or ED for worsening symptoms. Provided 988 crisis number as well.  1. CONCERN: What happened that made you call today?     Increasing depression   2. DEPRESSION SYMPTOM SCREENING: How are you feeling overall? (e.g., decreased energy, increased sleeping or difficulty sleeping, difficulty concentrating, feelings of sadness, guilt, hopelessness, or worthlessness)     Agitation, overwhelm, depression, anxiety, loss of interest  in daily activities  3. RISK OF HARM - SUICIDAL IDEATION:  Do you ever have thoughts of hurting or killing yourself?  (e.g., yes, no, no but preoccupation with thoughts about death)     Denies active plan, does express wishing she could disappear. Denies wanting to hurt self.  4. RISK OF HARM - HOMICIDAL IDEATION:  Do you ever have thoughts of hurting or killing someone else?  (e.g., yes, no, no but preoccupation with thoughts about death)     Denies  5. FUNCTIONAL IMPAIRMENT: How have things been going for you overall? Have you had more difficulty than usual doing your normal daily activities?  (e.g., better, same, worse; self-care, school, work, interactions)     Able to care for self. Has less interest in daily activites  6. SUPPORT: Who is with you now? Who do you live with? Do you have family or friends who you can talk to?      At home currently with husband. Able to talk with her husband and sister.  7. THERAPIST: Do you have a counselor or therapist? If Yes, ask: What is their name?     Denies  8. STRESSORS: Has there been any new stress or recent changes in your life?     Recent failed facial surgery to help with muscle twitching after face lift years ago, daughter has back surgery coming up, holiday stress, husband will need surgery soon, recent left breast reconstruction  9. ALCOHOL USE OR SUBSTANCE USE (DRUG USE): Do you drink alcohol or use any illegal drugs?     Denies  10. OTHER: Do you have any other physical symptoms right now? (e.g., fever)       Denies  Protocols used: Depression-A-AH

## 2024-05-17 MED ORDER — FLUOXETINE HCL 20 MG PO TABS: 20.0000 mg | ORAL_TABLET | Freq: Every day | ORAL | 1 refills | Status: AC

## 2024-05-17 NOTE — Telephone Encounter (Signed)
 Addressed via mychart send in prozac and needs follow up about 4 weeks after starting.

## 2024-05-18 ENCOUNTER — Ambulatory Visit: Admitting: Internal Medicine

## 2024-05-29 ENCOUNTER — Other Ambulatory Visit: Payer: Self-pay

## 2024-05-29 ENCOUNTER — Encounter (HOSPITAL_BASED_OUTPATIENT_CLINIC_OR_DEPARTMENT_OTHER): Payer: Self-pay | Admitting: *Deleted

## 2024-05-29 ENCOUNTER — Emergency Department (HOSPITAL_BASED_OUTPATIENT_CLINIC_OR_DEPARTMENT_OTHER)
Admission: EM | Admit: 2024-05-29 | Discharge: 2024-05-29 | Disposition: A | Discharge status: Home / Self Care | Attending: Emergency Medicine | Admitting: Emergency Medicine

## 2024-05-29 DIAGNOSIS — R202 Paresthesia of skin: Secondary | ICD-10-CM | POA: Insufficient documentation

## 2024-05-29 DIAGNOSIS — R112 Nausea with vomiting, unspecified: Secondary | ICD-10-CM | POA: Insufficient documentation

## 2024-05-29 DIAGNOSIS — R002 Palpitations: Secondary | ICD-10-CM | POA: Diagnosis not present

## 2024-05-29 DIAGNOSIS — Z79899 Other long term (current) drug therapy: Secondary | ICD-10-CM | POA: Diagnosis not present

## 2024-05-29 LAB — CBC WITH DIFFERENTIAL/PLATELET
Abs Immature Granulocytes: 0 K/uL (ref 0.00–0.07)
Basophils Absolute: 0.1 K/uL (ref 0.0–0.1)
Basophils Relative: 1 %
Eosinophils Absolute: 0.4 K/uL (ref 0.0–0.5)
Eosinophils Relative: 9 %
HCT: 41.2 % (ref 36.0–46.0)
Hemoglobin: 14.1 g/dL (ref 12.0–15.0)
Immature Granulocytes: 0 %
Lymphocytes Relative: 25 %
Lymphs Abs: 1.2 K/uL (ref 0.7–4.0)
MCH: 30.3 pg (ref 26.0–34.0)
MCHC: 34.2 g/dL (ref 30.0–36.0)
MCV: 88.4 fL (ref 80.0–100.0)
Monocytes Absolute: 0.5 K/uL (ref 0.1–1.0)
Monocytes Relative: 11 %
Neutro Abs: 2.5 K/uL (ref 1.7–7.7)
Neutrophils Relative %: 54 %
Platelets: 185 K/uL (ref 150–400)
RBC: 4.66 MIL/uL (ref 3.87–5.11)
RDW: 12.5 % (ref 11.5–15.5)
WBC: 4.7 K/uL (ref 4.0–10.5)
nRBC: 0 % (ref 0.0–0.2)

## 2024-05-29 LAB — BASIC METABOLIC PANEL WITH GFR
Anion gap: 10 (ref 5–15)
BUN: 16 mg/dL (ref 8–23)
CO2: 27 mmol/L (ref 22–32)
Calcium: 9.5 mg/dL (ref 8.9–10.3)
Chloride: 101 mmol/L (ref 98–111)
Creatinine, Ser: 0.84 mg/dL (ref 0.44–1.00)
GFR, Estimated: >60 mL/min (ref >60)
Glucose, Bld: 88 mg/dL (ref 70–99)
Potassium: 3.8 mmol/L (ref 3.5–5.1)
Sodium: 138 mmol/L (ref 135–145)

## 2024-05-29 NOTE — ED Provider Notes (Signed)
 [place] [facility]  Provider Note  CSN: [id number] Arrival date & time: 05/29/24 0218  History Chief Complaint  Patient presents with   Tachycardia    Cheryl Lawson is a 73 y.o. female with history of anxiety typically takes Xanax  at night also recently started prozac . She reports after going to bed tonight she noted her heart racing. Not having pain but maybe some pressure and one episode of regurgitation. She is feeling better now, but she was worried because she had also had a botox injection earlier in the day.    Home Medications Prior to Admission medications  Medication Sig Start Date End Date Taking? Authorizing Provider  ALPRAZolam  (XANAX ) 0.25 MG tablet TAKE 1 TABLET BY MOUTH AT BEDTIME AS NEEDED FOR ANXIETY 03/14/24   Rollene Almarie LABOR, MD  azelastine  (ASTELIN ) 0.1 % nasal spray Place 2 sprays into both nostrils 2 (two) times daily. Use in each nostril as directed 10/02/23   Tobie Comp B, MD  clobetasol  (TEMOVATE ) 0.05 % external solution Apply 1 Application topically 2 (two) times daily. 03/30/23   Rollene Almarie LABOR, MD  FLUoxetine  (PROZAC ) 20 MG tablet Take 1 tablet (20 mg total) by mouth daily. 05/17/24   Rollene Almarie LABOR, MD  ibuprofen  (ADVIL ) 200 MG tablet Take 200 mg by mouth 2 (two) times daily as needed.    [provider]  minoxidil (ROGAINE) 2 % external solution Apply 1 application topically at bedtime. Put in 3 places    [provider]  Multiple Vitamin (MULTIVITAMIN ADULT PO) Take 1 tablet by mouth daily at 6 (six) AM. Non formulary vitamins/minerals    [provider]  tretinoin (RETIN-A) 0.1 % cream SMARTSIG:sparingly Topical Every Night 09/28/23   [provider]     Allergies    Patient has no known allergies.   Review of Systems   Review of Systems Please see HPI for pertinent positives and negatives  Physical Exam BP 104/70 (BP Location: Left Arm)   Pulse 85    Temp 97.7 F (36.5 C) (Oral)   Resp (!) 21   Ht 5' 5 (1.651 m)   Wt 49.9 kg   SpO2 100%   BMI 18.30 kg/m   Physical Exam Vitals and nursing note reviewed.  Constitutional:      Appearance: Normal appearance.  HENT:     Head: Normocephalic and atraumatic.     Comments: Post surgical changes from recent facelift and other cosmetic injections without signs of complication    Nose: Nose normal.     Mouth/Throat:     Mouth: Mucous membranes are moist.  Eyes:     Extraocular Movements: Extraocular movements intact.     Conjunctiva/sclera: Conjunctivae normal.  Cardiovascular:     Rate and Rhythm: Normal rate.  Pulmonary:     Effort: Pulmonary effort is normal.     Breath sounds: Normal breath sounds.  Abdominal:     General: Abdomen is flat.     Palpations: Abdomen is soft.     Tenderness: There is no abdominal tenderness.  Musculoskeletal:        General: No swelling. Normal range of motion.     Cervical back: Neck supple.  Skin:    General: Skin is warm and dry.  Neurological:     General: No focal deficit present.     Mental Status: She is alert.  Psychiatric:        Mood and Affect: Mood normal.  ED Results / Procedures / Treatments   EKG EKG Interpretation Date/Time:  Sunday May 29 2024 02:30:20 EST Ventricular Rate:  83 PR Interval:  121 QRS Duration:  92 QT Interval:  367 QTC Calculation: 432 R Axis:   92  Text Interpretation: Sinus rhythm Biatrial enlargement Right axis deviation Borderline T abnormalities, anterior leads No significant change since last tracing Confirmed by Roselyn Dunnings [phone]) on 05/29/2024 3:19:36 AM  Procedures Procedures  Medications Ordered in the ED Medications - No data to display  Initial Impression and Plan  Patient here with a brief episode of racing heart. Feeling better now. Vitals and exam are reassuring. Will check labs, EKG and monitor in the ED for dysrhythmia   ED Course   Clinical Course as of  05/29/24 0354  Sun May 29, 2024  0322 CBC is normal.  [CS]  0340 BMP is normal.  [CS]  0353 Patient remains asymptomatic. No tachycardia or other dysrhythmias while in the ED. Plan discharge with PCP follow up, RTED for any other concerns.   [CS]    Clinical Course User Index [CS] Roselyn Dunnings NOVAK, MD     MDM Rules/Calculators/A&P Medical Decision Making Problems Addressed: Palpitations: acute illness or injury  Amount and/or Complexity of Data Reviewed Labs: ordered. Decision-making details documented in ED Course. ECG/medicine tests: ordered and independent interpretation performed. Decision-making details documented in ED Course.     Final Clinical Impression(s) / ED Diagnoses Final diagnoses:  Palpitations    Rx / DC Orders ED Discharge Orders     None        Roselyn Dunnings NOVAK, MD 05/29/24 [phone]

## 2024-05-29 NOTE — ED Notes (Signed)

## 2024-05-29 NOTE — ED Triage Notes (Addendum)
 Pt bib husband. Pt states took xanax  (Takes every night)  and soon started to feel pressure in chest and heart started to race. Pt also had N/V. Emesis x 1 (small amount) ,Mild tingling in left hand.  Pt endorses having Botox 05/28/24 - forehead. Episode Trigger: by nothing.  Hx of Depression Takes Prozac  daily x 1 week

## 2024-06-03 MED ORDER — PAROXETINE HCL 10 MG PO TABS: 10.0000 mg | ORAL_TABLET | Freq: Every day | ORAL | 0 refills | Status: DC

## 2024-06-03 NOTE — Addendum Note (Signed)
 Addended by: ROLLENE NORRIS A on: 06/03/2024 12:25 PM   Modules accepted: Orders

## 2024-06-25 ENCOUNTER — Other Ambulatory Visit: Payer: Self-pay | Admitting: Internal Medicine
